# Patient Record
Sex: Male | Born: 1970
Health system: Southern US, Community
[De-identification: ages and names within clinical notes are randomized; demographics above are authoritative.]

## PROBLEM LIST (undated history)

## (undated) DIAGNOSIS — I2510 Atherosclerotic heart disease of native coronary artery without angina pectoris: Secondary | ICD-10-CM

## (undated) DIAGNOSIS — Z720 Tobacco use: Secondary | ICD-10-CM

## (undated) DIAGNOSIS — F419 Anxiety disorder, unspecified: Secondary | ICD-10-CM

## (undated) HISTORY — PX: CARDIAC CATHETERIZATION: SHX172

## (undated) HISTORY — DX: Tobacco use: Z72.0

## (undated) HISTORY — PX: HERNIA REPAIR: SHX51

## (undated) HISTORY — DX: Atherosclerotic heart disease of native coronary artery without angina pectoris: I25.10

---

## 1999-01-29 ENCOUNTER — Emergency Department (HOSPITAL_COMMUNITY): Admission: EM | Admit: 1999-01-29 | Discharge: 1999-01-29 | Payer: Self-pay | Admitting: Emergency Medicine

## 1999-01-31 ENCOUNTER — Emergency Department (HOSPITAL_COMMUNITY): Admission: EM | Admit: 1999-01-31 | Discharge: 1999-01-31 | Payer: Self-pay | Admitting: *Deleted

## 2000-04-19 ENCOUNTER — Emergency Department (HOSPITAL_COMMUNITY): Admission: EM | Admit: 2000-04-19 | Discharge: 2000-04-19 | Payer: Self-pay | Admitting: Emergency Medicine

## 2003-08-23 ENCOUNTER — Emergency Department (HOSPITAL_COMMUNITY): Admission: EM | Admit: 2003-08-23 | Discharge: 2003-08-23 | Payer: Self-pay | Admitting: Family Medicine

## 2003-09-24 ENCOUNTER — Emergency Department (HOSPITAL_COMMUNITY): Admission: AD | Admit: 2003-09-24 | Discharge: 2003-09-24 | Payer: Self-pay | Admitting: Family Medicine

## 2004-01-24 ENCOUNTER — Emergency Department (HOSPITAL_COMMUNITY): Admission: EM | Admit: 2004-01-24 | Discharge: 2004-01-24 | Payer: Self-pay | Admitting: Family Medicine

## 2004-09-16 ENCOUNTER — Emergency Department (HOSPITAL_COMMUNITY): Admission: EM | Admit: 2004-09-16 | Discharge: 2004-09-16 | Payer: Self-pay | Admitting: Family Medicine

## 2005-01-01 ENCOUNTER — Ambulatory Visit (HOSPITAL_BASED_OUTPATIENT_CLINIC_OR_DEPARTMENT_OTHER): Admission: RE | Admit: 2005-01-01 | Discharge: 2005-01-01 | Payer: Self-pay | Admitting: General Surgery

## 2005-01-01 ENCOUNTER — Encounter (INDEPENDENT_AMBULATORY_CARE_PROVIDER_SITE_OTHER): Payer: Self-pay | Admitting: Specialist

## 2005-01-01 ENCOUNTER — Ambulatory Visit (HOSPITAL_COMMUNITY): Admission: RE | Admit: 2005-01-01 | Discharge: 2005-01-01 | Payer: Self-pay | Admitting: General Surgery

## 2005-02-10 ENCOUNTER — Emergency Department (HOSPITAL_COMMUNITY): Admission: EM | Admit: 2005-02-10 | Discharge: 2005-02-10 | Payer: Self-pay | Admitting: Emergency Medicine

## 2008-02-02 ENCOUNTER — Emergency Department (HOSPITAL_COMMUNITY): Admission: EM | Admit: 2008-02-02 | Discharge: 2008-02-02 | Payer: Self-pay | Admitting: Family Medicine

## 2008-08-01 ENCOUNTER — Emergency Department (HOSPITAL_COMMUNITY): Admission: EM | Admit: 2008-08-01 | Discharge: 2008-08-01 | Payer: Self-pay | Admitting: Emergency Medicine

## 2009-07-27 ENCOUNTER — Emergency Department (HOSPITAL_COMMUNITY): Admission: EM | Admit: 2009-07-27 | Discharge: 2009-07-27 | Payer: Self-pay | Admitting: Emergency Medicine

## 2010-12-11 NOTE — Op Note (Signed)
NAMELEONA, Charles Montoya                  ACCOUNT NO.:  [id number]   MEDICAL RECORD NO.:  [id number]          PATIENT TYPE:  AMB   LOCATION:  DSC                          FACILITY:  [facility]   PHYSICIAN:  Gita Kudo, M.D. DATE OF BIRTH:  Sep 30, 1970   DATE OF PROCEDURE:  01/01/2005  DATE OF DISCHARGE:                                 OPERATIVE REPORT   OPERATIVE PROCEDURE:  Excision cystic lesion, right medial knee.   SURGEON:  Gita Kudo, M.D.   ANESTHESIA:  1% Xylocaine.   PREOPERATIVE DIAGNOSIS:  Cystic lesion, left knee medially.   POSTOPERATIVE DIAGNOSIS:  Cystic lesion, left knee medially.   CLINICAL SUMMARY:  A 40 year old male with an enlarging lesion on the medial  aspect of his left knee. He states this has been there for a few months. An  aspiration at an outpatient facility did not yield fluid. It enlarged and I  saw him in the office.  Since then he stated that it had drained.   OPERATIVE FINDING:  There was an inflammatory mass in the medial aspect of  the left knee,  consistent with an inflamed epidermoid cyst. It did not  extend beneath the deep fascia.   OPERATIVE PROCEDURE:  The patient was positioned, prepped and draped in the  standard fashion. Xylocaine 1% was infiltrated for good analgesia. An  elliptical incision made around the mass, which was then sharply dissected  from  the underlying soft tissue and removed. The wound was then infiltrated  with more Xylocaine, one vein was tied with 3-0 Vicryl, and then the wound  closed with interrupted widely placed 3-0 nylon mattress sutures. A sterile  absorbent compressive dressing was applied, and he went to the waiting room  to be discharged as an outpatient. He was prescribed Vicodin for pain and  given written instructions.       MRL/MEDQ  D:  01/01/2005  T:  01/01/2005  Job:  [id number]

## 2010-12-26 ENCOUNTER — Inpatient Hospital Stay (INDEPENDENT_AMBULATORY_CARE_PROVIDER_SITE_OTHER)
Admission: RE | Admit: 2010-12-26 | Discharge: 2010-12-26 | Disposition: A | Discharge status: Home / Self Care | Payer: Self-pay | Source: Ambulatory Visit | Attending: Family Medicine | Admitting: Family Medicine

## 2010-12-26 ENCOUNTER — Ambulatory Visit (INDEPENDENT_AMBULATORY_CARE_PROVIDER_SITE_OTHER): Payer: Self-pay

## 2010-12-26 DIAGNOSIS — R0609 Other forms of dyspnea: Secondary | ICD-10-CM

## 2010-12-26 DIAGNOSIS — R0989 Other specified symptoms and signs involving the circulatory and respiratory systems: Secondary | ICD-10-CM

## 2010-12-26 DIAGNOSIS — J40 Bronchitis, not specified as acute or chronic: Secondary | ICD-10-CM

## 2012-06-22 ENCOUNTER — Encounter (HOSPITAL_COMMUNITY): Payer: Self-pay | Admitting: Emergency Medicine

## 2012-06-22 ENCOUNTER — Emergency Department (HOSPITAL_COMMUNITY)
Admission: EM | Admit: 2012-06-22 | Discharge: 2012-06-22 | Disposition: A | Discharge status: Home / Self Care | Payer: Self-pay | Attending: Emergency Medicine | Admitting: Emergency Medicine

## 2012-06-22 DIAGNOSIS — J029 Acute pharyngitis, unspecified: Secondary | ICD-10-CM | POA: Insufficient documentation

## 2012-06-22 DIAGNOSIS — F17200 Nicotine dependence, unspecified, uncomplicated: Secondary | ICD-10-CM | POA: Insufficient documentation

## 2012-06-22 DIAGNOSIS — R599 Enlarged lymph nodes, unspecified: Secondary | ICD-10-CM | POA: Insufficient documentation

## 2012-06-22 DIAGNOSIS — Z79899 Other long term (current) drug therapy: Secondary | ICD-10-CM | POA: Insufficient documentation

## 2012-06-22 DIAGNOSIS — R1310 Dysphagia, unspecified: Secondary | ICD-10-CM | POA: Insufficient documentation

## 2012-06-22 MED ORDER — AZITHROMYCIN 250 MG PO TABS: ORAL_TABLET | ORAL | Status: DC

## 2012-06-22 NOTE — ED Provider Notes (Signed)
History     CSN: [id number]  Arrival date & time 06/22/12  4098   First MD Initiated Contact with Patient 06/22/12 0502      Chief Complaint  Patient presents with  . Sore Throat    (Consider location/radiation/quality/duration/timing/severity/associated sxs/prior treatment) HPI Comments: Patient presents with complaint of sore throat and cervical LDA X 2 days. He states that he felt that he had some trouble swallowing. Denies fever or chills. Denies cough or congestion.   The history is provided by the patient. No language interpreter was used.    History reviewed. No pertinent past medical history.  History reviewed. No pertinent past surgical history.  No family history on file.  History  Substance Use Topics  . Smoking status: Current Every Day Smoker  . Smokeless tobacco: Not on file  . Alcohol Use: No      Review of Systems  Constitutional: Negative for fever and chills.  HENT: Positive for sore throat and trouble swallowing. Negative for congestion.   Respiratory: Negative for cough.     Allergies  Review of patient's allergies indicates no known allergies.  Home Medications   Current Outpatient Rx  Name  Route  Sig  Dispense  Refill  . ALPRAZOLAM 0.5 MG PO TABS   Oral   Take 0.25 mg by mouth 3 (three) times daily as needed. Anxiety         . CLOMIPRAMINE HCL 25 MG PO CAPS   Oral   Take 25 mg by mouth at bedtime.         Marland Kitchen SACCHAROMYCES BOULARDII 250 MG PO CAPS   Oral   Take 250 mg by mouth daily.           BP 135/95  Pulse 74  Temp 97.6 F (36.4 C) (Oral)  Resp 18  Ht 5\' 10"  (1.778 m)  Wt 175 lb (79.379 kg)  BMI 25.11 kg/m2  SpO2 100%  Physical Exam  Nursing note and vitals reviewed. Constitutional: He appears well-developed and well-nourished.  HENT:  Head: Normocephalic and atraumatic.  Mouth/Throat: Oropharynx is clear and moist. No oropharyngeal exudate.       Oropharynx erythematous and injected.  Eyes: Conjunctivae  normal and EOM are normal. No scleral icterus.  Neck: Normal range of motion. Neck supple.       Superficial cervical adenopathy appreciated on the right.  Cardiovascular: Normal rate, regular rhythm and normal heart sounds.   Pulmonary/Chest: Effort normal and breath sounds normal.  Abdominal: Soft. There is no tenderness.  Lymphadenopathy:    He has cervical adenopathy.  Neurological: He is alert.  Skin: Skin is warm.    ED Course  Procedures (including critical care time)   Labs Reviewed  RAPID STREP SCREEN   Results for orders placed during the hospital encounter of 06/22/12  RAPID STREP SCREEN      Component Value Range   Streptococcus, Group A Screen (Direct) NEGATIVE  NEGATIVE    No results found.   1. Viral pharyngitis       MDM  Patient presented with symptoms of pharyngitis. Rapid strep negative. Discharged with return precautions. No red flags for peritonsillar abscess.         Pixie Casino, PA-C 06/22/12 [phone]

## 2012-06-22 NOTE — ED Notes (Signed)
Pt c/o pain and swelling to L neck, pt c/o pain with swallowing.

## 2012-06-23 NOTE — ED Provider Notes (Signed)
Medical screening examination/treatment/procedure(s) were performed by non-physician practitioner and as supervising physician I was immediately available for consultation/collaboration.  Matti Minney, MD 06/23/12 0009 

## 2012-12-19 ENCOUNTER — Ambulatory Visit: Payer: Self-pay | Admitting: Internal Medicine

## 2015-10-29 DIAGNOSIS — F4000 Agoraphobia, unspecified: Secondary | ICD-10-CM | POA: Diagnosis not present

## 2015-10-30 DIAGNOSIS — M9903 Segmental and somatic dysfunction of lumbar region: Secondary | ICD-10-CM | POA: Diagnosis not present

## 2015-10-30 DIAGNOSIS — M9906 Segmental and somatic dysfunction of lower extremity: Secondary | ICD-10-CM | POA: Diagnosis not present

## 2015-10-30 DIAGNOSIS — M791 Myalgia: Secondary | ICD-10-CM | POA: Diagnosis not present

## 2015-10-30 DIAGNOSIS — M9905 Segmental and somatic dysfunction of pelvic region: Secondary | ICD-10-CM | POA: Diagnosis not present

## 2015-11-04 DIAGNOSIS — F4000 Agoraphobia, unspecified: Secondary | ICD-10-CM | POA: Diagnosis not present

## 2015-11-13 DIAGNOSIS — F4000 Agoraphobia, unspecified: Secondary | ICD-10-CM | POA: Diagnosis not present

## 2015-11-24 DIAGNOSIS — F410 Panic disorder [episodic paroxysmal anxiety] without agoraphobia: Secondary | ICD-10-CM | POA: Diagnosis not present

## 2015-11-24 DIAGNOSIS — F4010 Social phobia, unspecified: Secondary | ICD-10-CM | POA: Diagnosis not present

## 2015-11-26 DIAGNOSIS — M9906 Segmental and somatic dysfunction of lower extremity: Secondary | ICD-10-CM | POA: Diagnosis not present

## 2015-11-26 DIAGNOSIS — M9905 Segmental and somatic dysfunction of pelvic region: Secondary | ICD-10-CM | POA: Diagnosis not present

## 2015-11-26 DIAGNOSIS — M9903 Segmental and somatic dysfunction of lumbar region: Secondary | ICD-10-CM | POA: Diagnosis not present

## 2015-11-26 DIAGNOSIS — M791 Myalgia: Secondary | ICD-10-CM | POA: Diagnosis not present

## 2015-11-28 DIAGNOSIS — F4000 Agoraphobia, unspecified: Secondary | ICD-10-CM | POA: Diagnosis not present

## 2015-12-03 DIAGNOSIS — M9906 Segmental and somatic dysfunction of lower extremity: Secondary | ICD-10-CM | POA: Diagnosis not present

## 2015-12-03 DIAGNOSIS — M9903 Segmental and somatic dysfunction of lumbar region: Secondary | ICD-10-CM | POA: Diagnosis not present

## 2015-12-03 DIAGNOSIS — M9905 Segmental and somatic dysfunction of pelvic region: Secondary | ICD-10-CM | POA: Diagnosis not present

## 2015-12-03 DIAGNOSIS — M791 Myalgia: Secondary | ICD-10-CM | POA: Diagnosis not present

## 2015-12-04 DIAGNOSIS — R0602 Shortness of breath: Secondary | ICD-10-CM | POA: Diagnosis not present

## 2015-12-04 DIAGNOSIS — J302 Other seasonal allergic rhinitis: Secondary | ICD-10-CM | POA: Diagnosis not present

## 2015-12-04 DIAGNOSIS — F4000 Agoraphobia, unspecified: Secondary | ICD-10-CM | POA: Diagnosis not present

## 2015-12-11 DIAGNOSIS — M9905 Segmental and somatic dysfunction of pelvic region: Secondary | ICD-10-CM | POA: Diagnosis not present

## 2015-12-11 DIAGNOSIS — M9903 Segmental and somatic dysfunction of lumbar region: Secondary | ICD-10-CM | POA: Diagnosis not present

## 2015-12-11 DIAGNOSIS — M9906 Segmental and somatic dysfunction of lower extremity: Secondary | ICD-10-CM | POA: Diagnosis not present

## 2015-12-11 DIAGNOSIS — M791 Myalgia: Secondary | ICD-10-CM | POA: Diagnosis not present

## 2015-12-16 DIAGNOSIS — M9905 Segmental and somatic dysfunction of pelvic region: Secondary | ICD-10-CM | POA: Diagnosis not present

## 2015-12-16 DIAGNOSIS — M9906 Segmental and somatic dysfunction of lower extremity: Secondary | ICD-10-CM | POA: Diagnosis not present

## 2015-12-16 DIAGNOSIS — M9903 Segmental and somatic dysfunction of lumbar region: Secondary | ICD-10-CM | POA: Diagnosis not present

## 2015-12-16 DIAGNOSIS — M791 Myalgia: Secondary | ICD-10-CM | POA: Diagnosis not present

## 2015-12-18 DIAGNOSIS — M9903 Segmental and somatic dysfunction of lumbar region: Secondary | ICD-10-CM | POA: Diagnosis not present

## 2015-12-18 DIAGNOSIS — M791 Myalgia: Secondary | ICD-10-CM | POA: Diagnosis not present

## 2015-12-18 DIAGNOSIS — M9906 Segmental and somatic dysfunction of lower extremity: Secondary | ICD-10-CM | POA: Diagnosis not present

## 2015-12-18 DIAGNOSIS — M9905 Segmental and somatic dysfunction of pelvic region: Secondary | ICD-10-CM | POA: Diagnosis not present

## 2016-01-01 DIAGNOSIS — F4000 Agoraphobia, unspecified: Secondary | ICD-10-CM | POA: Diagnosis not present

## 2016-02-02 DIAGNOSIS — M9901 Segmental and somatic dysfunction of cervical region: Secondary | ICD-10-CM | POA: Diagnosis not present

## 2016-02-02 DIAGNOSIS — M791 Myalgia: Secondary | ICD-10-CM | POA: Diagnosis not present

## 2016-02-02 DIAGNOSIS — G44209 Tension-type headache, unspecified, not intractable: Secondary | ICD-10-CM | POA: Diagnosis not present

## 2016-02-02 DIAGNOSIS — M9902 Segmental and somatic dysfunction of thoracic region: Secondary | ICD-10-CM | POA: Diagnosis not present

## 2016-02-03 DIAGNOSIS — F4000 Agoraphobia, unspecified: Secondary | ICD-10-CM | POA: Diagnosis not present

## 2016-02-17 DIAGNOSIS — F4000 Agoraphobia, unspecified: Secondary | ICD-10-CM | POA: Diagnosis not present

## 2016-03-10 DIAGNOSIS — F4000 Agoraphobia, unspecified: Secondary | ICD-10-CM | POA: Diagnosis not present

## 2016-03-23 DIAGNOSIS — F4000 Agoraphobia, unspecified: Secondary | ICD-10-CM | POA: Diagnosis not present

## 2016-04-07 DIAGNOSIS — F4000 Agoraphobia, unspecified: Secondary | ICD-10-CM | POA: Diagnosis not present

## 2016-04-21 DIAGNOSIS — F4000 Agoraphobia, unspecified: Secondary | ICD-10-CM | POA: Diagnosis not present

## 2016-05-05 DIAGNOSIS — F4000 Agoraphobia, unspecified: Secondary | ICD-10-CM | POA: Diagnosis not present

## 2016-05-17 DIAGNOSIS — F410 Panic disorder [episodic paroxysmal anxiety] without agoraphobia: Secondary | ICD-10-CM | POA: Diagnosis not present

## 2016-06-03 DIAGNOSIS — F4000 Agoraphobia, unspecified: Secondary | ICD-10-CM | POA: Diagnosis not present

## 2016-06-30 DIAGNOSIS — F4000 Agoraphobia, unspecified: Secondary | ICD-10-CM | POA: Diagnosis not present

## 2016-11-08 DIAGNOSIS — F4010 Social phobia, unspecified: Secondary | ICD-10-CM | POA: Diagnosis not present

## 2016-11-16 DIAGNOSIS — M9901 Segmental and somatic dysfunction of cervical region: Secondary | ICD-10-CM | POA: Diagnosis not present

## 2016-11-16 DIAGNOSIS — M545 Low back pain: Secondary | ICD-10-CM | POA: Diagnosis not present

## 2016-11-16 DIAGNOSIS — M542 Cervicalgia: Secondary | ICD-10-CM | POA: Diagnosis not present

## 2016-11-16 DIAGNOSIS — M9903 Segmental and somatic dysfunction of lumbar region: Secondary | ICD-10-CM | POA: Diagnosis not present

## 2016-11-23 DIAGNOSIS — M545 Low back pain: Secondary | ICD-10-CM | POA: Diagnosis not present

## 2016-11-23 DIAGNOSIS — M9903 Segmental and somatic dysfunction of lumbar region: Secondary | ICD-10-CM | POA: Diagnosis not present

## 2016-11-23 DIAGNOSIS — M9901 Segmental and somatic dysfunction of cervical region: Secondary | ICD-10-CM | POA: Diagnosis not present

## 2016-11-23 DIAGNOSIS — M542 Cervicalgia: Secondary | ICD-10-CM | POA: Diagnosis not present

## 2016-11-25 DIAGNOSIS — M542 Cervicalgia: Secondary | ICD-10-CM | POA: Diagnosis not present

## 2016-11-25 DIAGNOSIS — M9901 Segmental and somatic dysfunction of cervical region: Secondary | ICD-10-CM | POA: Diagnosis not present

## 2016-11-25 DIAGNOSIS — M545 Low back pain: Secondary | ICD-10-CM | POA: Diagnosis not present

## 2016-11-25 DIAGNOSIS — M9903 Segmental and somatic dysfunction of lumbar region: Secondary | ICD-10-CM | POA: Diagnosis not present

## 2016-11-29 DIAGNOSIS — S61219A Laceration without foreign body of unspecified finger without damage to nail, initial encounter: Secondary | ICD-10-CM | POA: Diagnosis not present

## 2016-12-13 DIAGNOSIS — M542 Cervicalgia: Secondary | ICD-10-CM | POA: Diagnosis not present

## 2016-12-13 DIAGNOSIS — M545 Low back pain: Secondary | ICD-10-CM | POA: Diagnosis not present

## 2016-12-13 DIAGNOSIS — M9901 Segmental and somatic dysfunction of cervical region: Secondary | ICD-10-CM | POA: Diagnosis not present

## 2016-12-13 DIAGNOSIS — M9903 Segmental and somatic dysfunction of lumbar region: Secondary | ICD-10-CM | POA: Diagnosis not present

## 2017-01-05 DIAGNOSIS — M9903 Segmental and somatic dysfunction of lumbar region: Secondary | ICD-10-CM | POA: Diagnosis not present

## 2017-01-05 DIAGNOSIS — M542 Cervicalgia: Secondary | ICD-10-CM | POA: Diagnosis not present

## 2017-01-05 DIAGNOSIS — M545 Low back pain: Secondary | ICD-10-CM | POA: Diagnosis not present

## 2017-01-05 DIAGNOSIS — M9901 Segmental and somatic dysfunction of cervical region: Secondary | ICD-10-CM | POA: Diagnosis not present

## 2017-01-10 DIAGNOSIS — M9903 Segmental and somatic dysfunction of lumbar region: Secondary | ICD-10-CM | POA: Diagnosis not present

## 2017-01-10 DIAGNOSIS — M542 Cervicalgia: Secondary | ICD-10-CM | POA: Diagnosis not present

## 2017-01-10 DIAGNOSIS — M545 Low back pain: Secondary | ICD-10-CM | POA: Diagnosis not present

## 2017-01-10 DIAGNOSIS — M9901 Segmental and somatic dysfunction of cervical region: Secondary | ICD-10-CM | POA: Diagnosis not present

## 2017-01-18 ENCOUNTER — Inpatient Hospital Stay (HOSPITAL_COMMUNITY)
Admission: EM | Admit: 2017-01-18 | Discharge: 2017-01-20 | DRG: 247 | Disposition: A | Discharge status: Home / Self Care | Payer: BLUE CROSS/BLUE SHIELD | Attending: Internal Medicine | Admitting: Internal Medicine

## 2017-01-18 ENCOUNTER — Encounter (HOSPITAL_COMMUNITY): Payer: Self-pay | Admitting: *Deleted

## 2017-01-18 ENCOUNTER — Emergency Department (HOSPITAL_COMMUNITY): Payer: BLUE CROSS/BLUE SHIELD

## 2017-01-18 DIAGNOSIS — R079 Chest pain, unspecified: Secondary | ICD-10-CM | POA: Diagnosis not present

## 2017-01-18 DIAGNOSIS — R748 Abnormal levels of other serum enzymes: Secondary | ICD-10-CM | POA: Diagnosis not present

## 2017-01-18 DIAGNOSIS — F419 Anxiety disorder, unspecified: Secondary | ICD-10-CM | POA: Diagnosis not present

## 2017-01-18 DIAGNOSIS — M542 Cervicalgia: Secondary | ICD-10-CM | POA: Diagnosis not present

## 2017-01-18 DIAGNOSIS — Z79899 Other long term (current) drug therapy: Secondary | ICD-10-CM

## 2017-01-18 DIAGNOSIS — K219 Gastro-esophageal reflux disease without esophagitis: Secondary | ICD-10-CM | POA: Diagnosis present

## 2017-01-18 DIAGNOSIS — M9901 Segmental and somatic dysfunction of cervical region: Secondary | ICD-10-CM | POA: Diagnosis not present

## 2017-01-18 DIAGNOSIS — Z955 Presence of coronary angioplasty implant and graft: Secondary | ICD-10-CM

## 2017-01-18 DIAGNOSIS — Z720 Tobacco use: Secondary | ICD-10-CM | POA: Diagnosis present

## 2017-01-18 DIAGNOSIS — I25110 Atherosclerotic heart disease of native coronary artery with unstable angina pectoris: Secondary | ICD-10-CM | POA: Diagnosis not present

## 2017-01-18 DIAGNOSIS — R072 Precordial pain: Secondary | ICD-10-CM

## 2017-01-18 DIAGNOSIS — F329 Major depressive disorder, single episode, unspecified: Secondary | ICD-10-CM | POA: Diagnosis not present

## 2017-01-18 DIAGNOSIS — I200 Unstable angina: Secondary | ICD-10-CM | POA: Diagnosis not present

## 2017-01-18 DIAGNOSIS — F17210 Nicotine dependence, cigarettes, uncomplicated: Secondary | ICD-10-CM | POA: Diagnosis present

## 2017-01-18 DIAGNOSIS — M9903 Segmental and somatic dysfunction of lumbar region: Secondary | ICD-10-CM | POA: Diagnosis not present

## 2017-01-18 DIAGNOSIS — I2510 Atherosclerotic heart disease of native coronary artery without angina pectoris: Secondary | ICD-10-CM

## 2017-01-18 DIAGNOSIS — M545 Low back pain: Secondary | ICD-10-CM | POA: Diagnosis not present

## 2017-01-18 HISTORY — DX: Anxiety disorder, unspecified: F41.9

## 2017-01-18 LAB — RAPID URINE DRUG SCREEN, HOSP PERFORMED
AMPHETAMINES: NOT DETECTED
BARBITURATES: NOT DETECTED
BENZODIAZEPINES: NOT DETECTED
Cocaine: NOT DETECTED
Opiates: NOT DETECTED
TETRAHYDROCANNABINOL: NOT DETECTED

## 2017-01-18 LAB — CBC
HCT: 40.3 % (ref 39.0–52.0)
HEMOGLOBIN: 14.7 g/dL (ref 13.0–17.0)
MCH: 31.7 pg (ref 26.0–34.0)
MCHC: 36.5 g/dL — ABNORMAL HIGH (ref 30.0–36.0)
MCV: 86.9 fL (ref 78.0–100.0)
Platelets: 240 10*3/uL (ref 150–400)
RBC: 4.64 MIL/uL (ref 4.22–5.81)
RDW: 12.6 % (ref 11.5–15.5)
WBC: 9.1 10*3/uL (ref 4.0–10.5)

## 2017-01-18 LAB — HEPATIC FUNCTION PANEL
ALT: 24 U/L (ref 17–63)
AST: 25 U/L (ref 15–41)
Albumin: 4.7 g/dL (ref 3.5–5.0)
Alkaline Phosphatase: 54 U/L (ref 38–126)
BILIRUBIN INDIRECT: 0.4 mg/dL (ref 0.3–0.9)
Bilirubin, Direct: 0.1 mg/dL (ref 0.1–0.5)
TOTAL PROTEIN: 7.5 g/dL (ref 6.5–8.1)
Total Bilirubin: 0.5 mg/dL (ref 0.3–1.2)

## 2017-01-18 LAB — BASIC METABOLIC PANEL
ANION GAP: 7 (ref 5–15)
BUN: 14 mg/dL (ref 6–20)
CALCIUM: 10 mg/dL (ref 8.9–10.3)
CO2: 29 mmol/L (ref 22–32)
Chloride: 103 mmol/L (ref 101–111)
Creatinine, Ser: 1.15 mg/dL (ref 0.61–1.24)
GFR calc non Af Amer: >60 mL/min (ref >60)
Glucose, Bld: 88 mg/dL (ref 65–99)
POTASSIUM: 4 mmol/L (ref 3.5–5.1)
Sodium: 139 mmol/L (ref 135–145)

## 2017-01-18 LAB — POCT I-STAT TROPONIN I: TROPONIN I, POC: 0.11 ng/mL — AB (ref 0.00–0.08)

## 2017-01-18 LAB — LIPASE, BLOOD: LIPASE: 49 U/L (ref 11–51)

## 2017-01-18 LAB — TROPONIN I: Troponin I: 0.11 ng/mL (ref <0.03)

## 2017-01-18 MED ORDER — PANTOPRAZOLE SODIUM 40 MG PO TBEC
40.0000 mg | DELAYED_RELEASE_TABLET | Freq: Every day | ORAL | Status: DC
  Administered 2017-01-19: 40 mg via ORAL
  Filled 2017-01-18 (×3): qty 1

## 2017-01-18 MED ORDER — CLOMIPRAMINE HCL 25 MG PO CAPS
25.0000 mg | ORAL_CAPSULE | Freq: Every day | ORAL | Status: DC
  Administered 2017-01-20: 25 mg via ORAL
  Filled 2017-01-18 (×2): qty 1

## 2017-01-18 MED ORDER — ASPIRIN 81 MG PO CHEW
324.0000 mg | CHEWABLE_TABLET | Freq: Once | ORAL | Status: AC
  Administered 2017-01-18: 324 mg via ORAL
  Filled 2017-01-18: qty 4

## 2017-01-18 MED ORDER — HYDRALAZINE HCL 20 MG/ML IJ SOLN: 10.0000 mg | INTRAMUSCULAR | Status: DC | PRN

## 2017-01-18 MED ORDER — ENOXAPARIN SODIUM 40 MG/0.4ML ~~LOC~~ SOLN
40.0000 mg | SUBCUTANEOUS | Status: DC
  Administered 2017-01-19: 40 mg via SUBCUTANEOUS
  Filled 2017-01-18: qty 0.4

## 2017-01-18 MED ORDER — NICOTINE 21 MG/24HR TD PT24: 21.0000 mg | MEDICATED_PATCH | Freq: Every day | TRANSDERMAL | Status: DC

## 2017-01-18 MED ORDER — ONDANSETRON HCL 4 MG/2ML IJ SOLN: 4.0000 mg | Freq: Four times a day (QID) | INTRAMUSCULAR | Status: DC | PRN

## 2017-01-18 MED ORDER — ACETAMINOPHEN 325 MG PO TABS: 650.0000 mg | ORAL_TABLET | ORAL | Status: DC | PRN

## 2017-01-18 MED ORDER — ALPRAZOLAM 0.5 MG PO TABS
0.5000 mg | ORAL_TABLET | Freq: Three times a day (TID) | ORAL | Status: DC | PRN
  Administered 2017-01-19 (×2): 0.5 mg via ORAL
  Administered 2017-01-19: 0.25 mg via ORAL
  Filled 2017-01-18 (×3): qty 1

## 2017-01-18 MED ORDER — ASPIRIN EC 325 MG PO TBEC
325.0000 mg | DELAYED_RELEASE_TABLET | Freq: Every day | ORAL | Status: DC
  Administered 2017-01-19: 325 mg via ORAL
  Filled 2017-01-18: qty 1

## 2017-01-18 NOTE — ED Notes (Signed)
Md at bedside

## 2017-01-18 NOTE — H&P (Signed)
History and Physical    Charles Montoya QBH:[id number] DOB: 1971-04-28 DOA: 01/18/2017  PCP: [facility]  Patient coming from: Home.  Chief Complaint: Chest pain.  HPI: Charles Montoya is a 46 y.o. male with history of tobacco abuse, anxiety and depression presents with complaints of chest pain. Patient states over the last 3-4 days patient has been having retrosternal burning sensation lasting for 5 minutes present even at rest and gets relief without any intervention. Patient has been taking PPI despite which patient gets these episodes. Denies any nausea vomiting diarrhea. Denies any shortness of breath fever or chills has been having some cough.   ED Course: In the ER EKG was showing normal sinus rhythm with chest x-ray unremarkable. Troponin was mildly positive. Cardiologist on-call was consulted. Cardiologist requested transfer to [facility]. Patient had my exam was chest pain-free.  Review of Systems: As per HPI, rest all negative.   Past Medical History:  Diagnosis Date  . Anxiety     Past Surgical History:  Procedure Laterality Date  . HERNIA REPAIR       reports that he has been smoking Cigarettes.  He has been smoking about 1.50 packs per day. He has never used smokeless tobacco. He reports that he does not drink alcohol or use drugs.  Allergies  Allergen Reactions  . Codeine Itching    Family History  Problem Relation Age of Onset  . CAD Father     Prior to Admission medications   Medication Sig Start Date End Date Taking? Authorizing Provider  ALPRAZolam Duanne Moron) 0.5 MG tablet Take 0.5 mg by mouth 3 (three) times daily as needed for anxiety. Anxiety    Yes [provider]  clomiPRAMINE (ANAFRANIL) 25 MG capsule Take 25 mg by mouth at bedtime.   Yes [provider]  omeprazole (PRILOSEC) 40 MG capsule Take 40 mg by mouth daily.   Yes [provider]  azithromycin (ZITHROMAX Z-PAK) 250 MG tablet 2 po day  one, then 1 daily x 4 days Patient not taking: Reported on 01/18/2017 06/22/12   Kathyrn Lass, PA-C    Physical Exam: Vitals:   01/18/17 2130 01/18/17 2130 01/18/17 2139 01/18/17 2200  BP: (!) 159/102 (!) 172/111 (!) 174/96 (!) 151/104  Pulse: 66 64  70  Resp: 18 14 12 17   Temp: 98.4 F (36.9 C)     TempSrc: Oral     SpO2: 100% 100%  100%  Weight:      Height:          Constitutional: Moderately built and nourished. Vitals:   01/18/17 2130 01/18/17 2130 01/18/17 2139 01/18/17 2200  BP: (!) 159/102 (!) 172/111 (!) 174/96 (!) 151/104  Pulse: 66 64  70  Resp: 18 14 12 17   Temp: 98.4 F (36.9 C)     TempSrc: Oral     SpO2: 100% 100%  100%  Weight:      Height:       Eyes: Anicteric no pallor. ENMT: No discharge from the ears eyes nose and mouth. Neck: No mass felt. No JVD appreciated. Respiratory: No rhonchi or crepitations. Cardiovascular: S1-S2 no murmurs appreciated. Abdomen: Soft nontender bowel sounds present. Musculoskeletal: No edema. No joint effusion. Skin: No rash. Skin appears warm. Neurologic: Alert awake oriented to time place and person. Moves all extremities. Psychiatric: Appears normal. Normal affect.   Labs on Admission: I have personally reviewed following labs and imaging studies  CBC:  Recent Labs  Lab 01/18/17 2045  WBC 9.1  HGB 14.7  HCT 40.3  MCV 86.9  PLT 678   Basic Metabolic Panel:  Recent Labs Lab 01/18/17 2045  NA 139  K 4.0  CL 103  CO2 29  GLUCOSE 88  BUN 14  CREATININE 1.15  CALCIUM 10.0   GFR: Estimated Creatinine Clearance: 87.3 mL/min (by C-G formula based on SCr of 1.15 mg/dL). Liver Function Tests:  Recent Labs Lab 01/18/17 2045  AST 25  ALT 24  ALKPHOS 54  BILITOT 0.5  PROT 7.5  ALBUMIN 4.7    Recent Labs Lab 01/18/17 2045  LIPASE 49   No results for input(s): AMMONIA in the last 168 hours. Coagulation Profile: No results for input(s): INR, PROTIME in the last 168 hours. Cardiac  Enzymes: No results for input(s): CKTOTAL, CKMB, CKMBINDEX, TROPONINI in the last 168 hours. BNP (last 3 results) No results for input(s): PROBNP in the last 8760 hours. HbA1C: No results for input(s): HGBA1C in the last 72 hours. CBG: No results for input(s): GLUCAP in the last 168 hours. Lipid Profile: No results for input(s): CHOL, HDL, LDLCALC, TRIG, CHOLHDL, LDLDIRECT in the last 72 hours. Thyroid Function Tests: No results for input(s): TSH, T4TOTAL, FREET4, T3FREE, THYROIDAB in the last 72 hours. Anemia Panel: No results for input(s): VITAMINB12, FOLATE, FERRITIN, TIBC, IRON, RETICCTPCT in the last 72 hours. Urine analysis: No results found for: COLORURINE, APPEARANCEUR, LABSPEC, PHURINE, GLUCOSEU, HGBUR, BILIRUBINUR, KETONESUR, PROTEINUR, UROBILINOGEN, NITRITE, LEUKOCYTESUR Sepsis Labs: @LABRCNTIP (procalcitonin:4,lacticidven:4) )No results found for this or any previous visit (from the past 240 hour(s)).   Radiological Exams on Admission: Dg Chest 2 View  Result Date: 01/18/2017 CLINICAL DATA:  Four-day history of intermittent chest pain. Current smoker. EXAM: CHEST  2 VIEW COMPARISON:  12/26/2010. FINDINGS: Cardiomediastinal silhouette unremarkable, unchanged. Lungs clear. Bronchovascular markings normal. Pulmonary vascularity normal. No visible pleural effusions. No pneumothorax. Degenerative changes involving the thoracic and upper lumbar spine. IMPRESSION: No acute cardiopulmonary disease. Electronically Signed   By: Evangeline Dakin M.D.   On: 01/18/2017 18:54    EKG: Independently reviewed. Normal sinus rhythm.  Assessment/Plan Principal Problem:   Chest pain Active Problems:   Tobacco abuse    1. Chest pain - given the risk factors including tobacco abuse and family history patient also has positive troponin will be admitted for further chest pain workup. Cardiology was notified and has requested transfer to [facility]. Dr. Alcario Drought will be the accepting  physician. Cycle cardiac markers check 2-D echo. Aspirin. Presently chest pain-free. Urine drug screen is pending. 2. Elevated blood pressure - closely follow blood pressure trends. I have placed patient on when necessary IV hydralazine. 3. History of depression and anxiety. Continue home medications.   DVT prophylaxis: Lovenox. Code Status: Full code.  Family Communication: Patient's mother.  Disposition Plan: Home.  Consults called: Cardiology.  Admission status: Observation.    Rise Patience MD [facility] Pager [phone].  If 7PM-7AM, please contact night-coverage [email or web address] Password [email or web address]  01/18/2017, 10:39 PM

## 2017-01-18 NOTE — ED Provider Notes (Signed)
[place] DEPT Provider Note   CSN: [id number] Arrival date & time: 01/18/17  8938     History   Chief Complaint Chief Complaint  Patient presents with  . Chest Pain    HPI Charles Montoya is a 46 y.o. male.  HPI Patients with 5 days of episodic central chest pain which she describes as burning and tingling in nature. Radiates to both arms. Last 5-10 minutes and then resolves spontaneously. Does not appear to be exertion related. One episode was after eating. Patient had episode of chest pain that started today around 5 PM. The pain is since resolved. Denies any new lower extremity swelling or pain. No extended travel or immobilization. Patient smokes 1.5 packs of cigarettes daily. Father had CABG in his 16s. Past Medical History:  Diagnosis Date  . Anxiety     Patient Active Problem List   Diagnosis Date Noted  . Chest pain 01/18/2017  . Tobacco abuse 01/18/2017    Past Surgical History:  Procedure Laterality Date  . HERNIA REPAIR         Home Medications    Prior to Admission medications   Medication Sig Start Date End Date Taking? Authorizing Provider  ALPRAZolam Duanne Moron) 0.5 MG tablet Take 0.5 mg by mouth 3 (three) times daily as needed for anxiety. Anxiety    Yes [provider]  clomiPRAMINE (ANAFRANIL) 25 MG capsule Take 25 mg by mouth at bedtime.   Yes [provider]  omeprazole (PRILOSEC) 40 MG capsule Take 40 mg by mouth daily.   Yes [provider]  azithromycin (ZITHROMAX Z-PAK) 250 MG tablet 2 po day one, then 1 daily x 4 days Patient not taking: Reported on 01/18/2017 06/22/12   Kathyrn Lass, PA-C    Family History Family History  Problem Relation Age of Onset  . CAD Father     Social History Social History  Substance Use Topics  . Smoking status: Current Every Day Smoker    Packs/day: 1.50    Types: Cigarettes  . Smokeless tobacco: Never Used  . Alcohol use No     Allergies   Codeine   Review of  Systems Review of Systems  Constitutional: Negative for chills and fever.  HENT: Negative for congestion.   Eyes: Negative for photophobia and visual disturbance.  Respiratory: Negative for cough, shortness of breath and wheezing.   Cardiovascular: Positive for chest pain. Negative for palpitations and leg swelling.  Gastrointestinal: Negative for abdominal pain, constipation, diarrhea, nausea and vomiting.  Genitourinary: Negative for dysuria, flank pain, frequency and hematuria.  Musculoskeletal: Negative for back pain, joint swelling, myalgias, neck pain and neck stiffness.  Skin: Negative for rash and wound.  Neurological: Negative for dizziness, weakness, light-headedness, numbness and headaches.  All other systems reviewed and are negative.    Physical Exam Updated Vital Signs BP (!) 169/113   Pulse 65   Temp 98.4 F (36.9 C) (Oral)   Resp 11   Ht 5\' 11"  (1.803 m)   Wt 84.8 kg (187 lb)   SpO2 99%   BMI 26.08 kg/m   Physical Exam   ED Treatments / Results  Labs (all labs ordered are listed, but only abnormal results are displayed) Labs Reviewed  CBC - Abnormal; Notable for the following:       Result Value   MCHC 36.5 (*)    All other components within normal limits  POCT I-STAT TROPONIN I - Abnormal; Notable for the following:    Troponin i,  poc 0.11 (*)    All other components within normal limits  BASIC METABOLIC PANEL  LIPASE, BLOOD  HEPATIC FUNCTION PANEL  TROPONIN I  TROPONIN I  TROPONIN I  HIV ANTIBODY (ROUTINE TESTING)  RAPID URINE DRUG SCREEN, HOSP PERFORMED  I-STAT TROPOININ, ED    EKG  EKG Interpretation  Date/Time:  Tuesday January 18 2017 18:10:25 EDT Ventricular Rate:  71 PR Interval:  126 QRS Duration: 104 QT Interval:  382 QTC Calculation: 415 R Axis:   46 Text Interpretation:  Normal sinus rhythm Incomplete right bundle branch block Cannot rule out Inferior infarct , age undetermined Abnormal ECG Confirmed by Lita Mains  MD, Shonte Soderlund  ([id number]) on 01/18/2017 10:29:18 PM       Radiology Dg Chest 2 View  Result Date: 01/18/2017 CLINICAL DATA:  Four-day history of intermittent chest pain. Current smoker. EXAM: CHEST  2 VIEW COMPARISON:  12/26/2010. FINDINGS: Cardiomediastinal silhouette unremarkable, unchanged. Lungs clear. Bronchovascular markings normal. Pulmonary vascularity normal. No visible pleural effusions. No pneumothorax. Degenerative changes involving the thoracic and upper lumbar spine. IMPRESSION: No acute cardiopulmonary disease. Electronically Signed   By: Evangeline Dakin M.D.   On: 01/18/2017 18:54    Procedures Procedures (including critical care time)  Medications Ordered in ED Medications  pantoprazole (PROTONIX) EC tablet 40 mg (not administered)  ALPRAZolam (XANAX) tablet 0.5 mg (not administered)  clomiPRAMINE (ANAFRANIL) capsule 25 mg (not administered)  acetaminophen (TYLENOL) tablet 650 mg (not administered)  ondansetron (ZOFRAN) injection 4 mg (not administered)  enoxaparin (LOVENOX) injection 40 mg (not administered)  aspirin EC tablet 325 mg (not administered)  hydrALAZINE (APRESOLINE) injection 10 mg (not administered)  aspirin chewable tablet 324 mg (324 mg Oral Given 01/18/17 2201)     Initial Impression / Assessment and Plan / ED Course  I have reviewed the triage vital signs and the nursing notes.  Pertinent labs & imaging results that were available during my care of the patient were reviewed by me and considered in my medical decision making (see chart for details).     Discussed with cardiology and recommended admitting to internal medicine and likely stress testing tomorrow. Patient given aspirin in the emergency department. Continues to be symptom free. Cardiology recommends trending troponins and have a try to admit and transfer to [facility]. [facility] will see in the emergency department. Dr. Alcario Drought to accept. Final Clinical Impressions(s) / ED Diagnoses   Final  diagnoses:  Precordial pain    New Prescriptions New Prescriptions   No medications on file     Julianne Rice, MD 01/18/17 2258

## 2017-01-18 NOTE — ED Triage Notes (Signed)
Pt reports upper chest pain that radiates to both his arms which lasts about 5 minutes each time.  Pt reports having this pain several times a day since Friday.  States onset could be anytime, nothing relieves it, or makes it worse.  Pt is A&Ox 4.  Pt reports his father had a CABG mid-60s.  Pt describes cp as burning, tingling, and sharp.  Denies any SOB,. Dizziness or nausea with it, only during the first bout of cp, but he states that it could be from his anxiety which he's been managing well.  Pt is also concerned that if this is not his heart, it might be PE d/t previous cold sxs for 2 weeks.  Pt is a smoker.

## 2017-01-19 ENCOUNTER — Other Ambulatory Visit: Payer: Self-pay

## 2017-01-19 ENCOUNTER — Encounter (HOSPITAL_COMMUNITY)
Admission: EM | Disposition: A | Discharge status: Home / Self Care | Payer: Self-pay | Source: Home / Self Care | Attending: Internal Medicine

## 2017-01-19 DIAGNOSIS — Z720 Tobacco use: Secondary | ICD-10-CM

## 2017-01-19 DIAGNOSIS — R748 Abnormal levels of other serum enzymes: Secondary | ICD-10-CM | POA: Diagnosis not present

## 2017-01-19 DIAGNOSIS — I200 Unstable angina: Secondary | ICD-10-CM | POA: Diagnosis not present

## 2017-01-19 DIAGNOSIS — F419 Anxiety disorder, unspecified: Secondary | ICD-10-CM | POA: Diagnosis not present

## 2017-01-19 DIAGNOSIS — I25110 Atherosclerotic heart disease of native coronary artery with unstable angina pectoris: Secondary | ICD-10-CM | POA: Diagnosis not present

## 2017-01-19 DIAGNOSIS — F329 Major depressive disorder, single episode, unspecified: Secondary | ICD-10-CM | POA: Diagnosis present

## 2017-01-19 DIAGNOSIS — Z79899 Other long term (current) drug therapy: Secondary | ICD-10-CM | POA: Diagnosis not present

## 2017-01-19 DIAGNOSIS — R072 Precordial pain: Secondary | ICD-10-CM | POA: Diagnosis not present

## 2017-01-19 DIAGNOSIS — I249 Acute ischemic heart disease, unspecified: Secondary | ICD-10-CM

## 2017-01-19 DIAGNOSIS — K219 Gastro-esophageal reflux disease without esophagitis: Secondary | ICD-10-CM | POA: Diagnosis not present

## 2017-01-19 DIAGNOSIS — I2510 Atherosclerotic heart disease of native coronary artery without angina pectoris: Secondary | ICD-10-CM

## 2017-01-19 DIAGNOSIS — R079 Chest pain, unspecified: Secondary | ICD-10-CM | POA: Diagnosis not present

## 2017-01-19 DIAGNOSIS — F17210 Nicotine dependence, cigarettes, uncomplicated: Secondary | ICD-10-CM | POA: Diagnosis present

## 2017-01-19 HISTORY — PX: LEFT HEART CATH AND CORONARY ANGIOGRAPHY: CATH118249

## 2017-01-19 HISTORY — PX: CORONARY STENT INTERVENTION: CATH118234

## 2017-01-19 LAB — PROTIME-INR
INR: 0.99
Prothrombin Time: 13.1 seconds (ref 11.4–15.2)

## 2017-01-19 LAB — POCT ACTIVATED CLOTTING TIME: Activated Clotting Time: 263 seconds

## 2017-01-19 LAB — LIPID PANEL
CHOL/HDL RATIO: 6.6 ratio
CHOLESTEROL: 219 mg/dL — AB (ref 0–200)
HDL: 33 mg/dL — AB (ref >40)
LDL Cholesterol: 138 mg/dL — ABNORMAL HIGH (ref 0–99)
TRIGLYCERIDES: 241 mg/dL — AB (ref <150)
VLDL: 48 mg/dL — AB (ref 0–40)

## 2017-01-19 LAB — TROPONIN I
TROPONIN I: 0.15 ng/mL — AB (ref <0.03)
Troponin I: 0.14 ng/mL (ref <0.03)
Troponin I: 0.12 ng/mL (ref <0.03)

## 2017-01-19 LAB — HIV ANTIBODY (ROUTINE TESTING W REFLEX): HIV SCREEN 4TH GENERATION: NONREACTIVE

## 2017-01-19 SURGERY — LEFT HEART CATH AND CORONARY ANGIOGRAPHY
Anesthesia: LOCAL

## 2017-01-19 MED ORDER — FENTANYL CITRATE (PF) 100 MCG/2ML IJ SOLN
INTRAMUSCULAR | Status: AC
  Filled 2017-01-19: qty 2

## 2017-01-19 MED ORDER — TICAGRELOR 90 MG PO TABS
90.0000 mg | ORAL_TABLET | Freq: Two times a day (BID) | ORAL | Status: DC
  Administered 2017-01-20: 06:00:00 90 mg via ORAL
  Filled 2017-01-19: qty 1

## 2017-01-19 MED ORDER — IOPAMIDOL (ISOVUE-370) INJECTION 76%
INTRAVENOUS | Status: AC
  Filled 2017-01-19: qty 50

## 2017-01-19 MED ORDER — SODIUM CHLORIDE 0.9 % IV SOLN: 250.0000 mL | INTRAVENOUS | Status: DC | PRN

## 2017-01-19 MED ORDER — HEPARIN SODIUM (PORCINE) 1000 UNIT/ML IJ SOLN
INTRAMUSCULAR | Status: DC | PRN
  Administered 2017-01-19: 2000 [IU] via INTRAVENOUS
  Administered 2017-01-19: 7000 [IU] via INTRAVENOUS

## 2017-01-19 MED ORDER — TIROFIBAN HCL IN NACL 5-0.9 MG/100ML-% IV SOLN
INTRAVENOUS | Status: AC
  Filled 2017-01-19: qty 100

## 2017-01-19 MED ORDER — HEPARIN (PORCINE) IN NACL 2-0.9 UNIT/ML-% IJ SOLN
INTRAMUSCULAR | Status: AC
  Filled 2017-01-19: qty 1000

## 2017-01-19 MED ORDER — VERAPAMIL HCL 2.5 MG/ML IV SOLN
INTRAVENOUS | Status: AC
  Filled 2017-01-19: qty 2

## 2017-01-19 MED ORDER — MIDAZOLAM HCL 2 MG/2ML IJ SOLN
INTRAMUSCULAR | Status: AC
  Filled 2017-01-19: qty 2

## 2017-01-19 MED ORDER — TIROFIBAN (AGGRASTAT) BOLUS VIA INFUSION
INTRAVENOUS | Status: DC | PRN
  Administered 2017-01-19: 2017.5 ug via INTRAVENOUS

## 2017-01-19 MED ORDER — ASPIRIN 81 MG PO CHEW
81.0000 mg | CHEWABLE_TABLET | Freq: Every day | ORAL | Status: DC
  Administered 2017-01-20: 81 mg via ORAL
  Filled 2017-01-19: qty 1

## 2017-01-19 MED ORDER — NITROGLYCERIN 1 MG/10 ML FOR IR/CATH LAB
INTRA_ARTERIAL | Status: AC
Start: 2017-01-19 — End: 2017-01-19
  Filled 2017-01-19: qty 10

## 2017-01-19 MED ORDER — MIDAZOLAM HCL 2 MG/2ML IJ SOLN
INTRAMUSCULAR | Status: DC | PRN
  Administered 2017-01-19: 1 mg via INTRAVENOUS
  Administered 2017-01-19: 2 mg via INTRAVENOUS

## 2017-01-19 MED ORDER — NITROGLYCERIN 1 MG/10 ML FOR IR/CATH LAB
INTRA_ARTERIAL | Status: DC | PRN
  Administered 2017-01-19: 200 ug via INTRACORONARY

## 2017-01-19 MED ORDER — SODIUM CHLORIDE 0.9 % WEIGHT BASED INFUSION
1.0000 mL/kg/h | INTRAVENOUS | Status: DC
  Administered 2017-01-19: 1 mL/kg/h via INTRAVENOUS

## 2017-01-19 MED ORDER — LIDOCAINE HCL (PF) 1 % IJ SOLN
INTRAMUSCULAR | Status: DC | PRN
  Administered 2017-01-19: 2 mL

## 2017-01-19 MED ORDER — SODIUM CHLORIDE 0.9 % IV SOLN
INTRAVENOUS | Status: AC
  Administered 2017-01-19: 20:00:00 via INTRAVENOUS

## 2017-01-19 MED ORDER — TICAGRELOR 90 MG PO TABS
ORAL_TABLET | ORAL | Status: DC | PRN
  Administered 2017-01-19: 180 mg via ORAL

## 2017-01-19 MED ORDER — SODIUM CHLORIDE 0.9% FLUSH: 3.0000 mL | Freq: Two times a day (BID) | INTRAVENOUS | Status: DC

## 2017-01-19 MED ORDER — HEPARIN SODIUM (PORCINE) 1000 UNIT/ML IJ SOLN
INTRAMUSCULAR | Status: AC
  Filled 2017-01-19: qty 1

## 2017-01-19 MED ORDER — IOPAMIDOL (ISOVUE-370) INJECTION 76%
INTRAVENOUS | Status: DC | PRN
Start: 2017-01-19 — End: 2017-01-19
  Administered 2017-01-19: 95 mL via INTRAVENOUS

## 2017-01-19 MED ORDER — FENTANYL CITRATE (PF) 100 MCG/2ML IJ SOLN
INTRAMUSCULAR | Status: DC | PRN
  Administered 2017-01-19 (×2): 25 ug via INTRAVENOUS

## 2017-01-19 MED ORDER — SODIUM CHLORIDE 0.9% FLUSH: 3.0000 mL | INTRAVENOUS | Status: DC | PRN

## 2017-01-19 MED ORDER — ATORVASTATIN CALCIUM 20 MG PO TABS
20.0000 mg | ORAL_TABLET | Freq: Every day | ORAL | Status: DC
  Administered 2017-01-19: 20 mg via ORAL
  Filled 2017-01-19: qty 1

## 2017-01-19 MED ORDER — IOPAMIDOL (ISOVUE-370) INJECTION 76%
INTRAVENOUS | Status: AC
  Filled 2017-01-19: qty 100

## 2017-01-19 MED ORDER — LABETALOL HCL 5 MG/ML IV SOLN: 10.0000 mg | INTRAVENOUS | Status: AC | PRN

## 2017-01-19 MED ORDER — LIDOCAINE HCL (PF) 1 % IJ SOLN
INTRAMUSCULAR | Status: AC
  Filled 2017-01-19: qty 30

## 2017-01-19 MED ORDER — ONDANSETRON HCL 4 MG/2ML IJ SOLN: 4.0000 mg | Freq: Four times a day (QID) | INTRAMUSCULAR | Status: DC | PRN

## 2017-01-19 MED ORDER — SODIUM CHLORIDE 0.9 % WEIGHT BASED INFUSION
3.0000 mL/kg/h | INTRAVENOUS | Status: DC
  Administered 2017-01-19: 3 mL/kg/h via INTRAVENOUS

## 2017-01-19 MED ORDER — TICAGRELOR 90 MG PO TABS
ORAL_TABLET | ORAL | Status: AC
  Filled 2017-01-19: qty 1

## 2017-01-19 MED ORDER — HYDRALAZINE HCL 20 MG/ML IJ SOLN: 5.0000 mg | INTRAMUSCULAR | Status: AC | PRN

## 2017-01-19 MED ORDER — ACETAMINOPHEN 325 MG PO TABS: 650.0000 mg | ORAL_TABLET | ORAL | Status: DC | PRN

## 2017-01-19 MED ORDER — VERAPAMIL HCL 2.5 MG/ML IV SOLN
INTRAVENOUS | Status: DC | PRN
  Administered 2017-01-19: 10 mL via INTRA_ARTERIAL

## 2017-01-19 MED ORDER — TIROFIBAN HCL IN NACL 5-0.9 MG/100ML-% IV SOLN
INTRAVENOUS | Status: AC | PRN
  Administered 2017-01-19: 0.15 ug/kg/min via INTRAVENOUS

## 2017-01-19 SURGICAL SUPPLY — 16 items
BALLN SAPPHIRE 2.5X15 (BALLOONS) ×2
BALLOON SAPPHIRE 2.5X15 (BALLOONS) IMPLANT
CATH INFINITI 5 FR JL3.5 (CATHETERS) ×1 IMPLANT
CATH INFINITI JR4 5F (CATHETERS) ×1 IMPLANT
CATH LAUNCHER 6FR JR4 (CATHETERS) ×1 IMPLANT
DEVICE RAD TR BAND REGULAR (VASCULAR PRODUCTS) ×1 IMPLANT
GLIDESHEATH SLEND SS 6F .021 (SHEATH) ×1 IMPLANT
GUIDEWIRE INQWIRE 1.5J.035X260 (WIRE) IMPLANT
INQWIRE 1.5J .035X260CM (WIRE) ×2
KIT HEART LEFT (KITS) ×2 IMPLANT
PACK CARDIAC CATHETERIZATION (CUSTOM PROCEDURE TRAY) ×2 IMPLANT
STENT PROMUS PREM MR 2.5X16 (Permanent Stent) ×1 IMPLANT
TRANSDUCER W/STOPCOCK (MISCELLANEOUS) ×2 IMPLANT
TUBING CIL FLEX 10 FLL-RA (TUBING) ×2 IMPLANT
VALVE GUARDIAN II ~~LOC~~ HEMO (MISCELLANEOUS) ×1 IMPLANT
WIRE ASAHI PROWATER 180CM (WIRE) ×1 IMPLANT

## 2017-01-19 NOTE — Progress Notes (Signed)
[facility] PROGRESS NOTE  Charles Montoya QQI:[id number] DOB: March 31, 1971 DOA: 01/18/2017   PCP: [facility]     Assessment/Plan: Principal Problem:   Chest pain Active Problems:   Tobacco abuse   Cardiac enzymes elevated    46 y.o. male with history of tobacco abuse, anxiety and depression presents with complaints of chest pain. Patient states over the last 3-4 days patient has been having retrosternal burning sensation lasting for 5 minutes present even at rest and gets relief without any intervention. Patient has been taking PPI despite which patient gets these episodes.  chest x-ray unremarkable. Troponin was mildly positive. Cardiologist consulted.    Assessment/Plan 1. Unstable angina - given the risk factors including tobacco abuse and family history patient also has positive troponin  Cardiology  Recommends cardiac cath. Pending  2-D echo. Aspirin. Presently chest pain-free. Urine drug screen negative.LDL 138, triglycerides 241,started on statin 2. Elevated blood pressure -continue prn  IV hydralazine. 3. History of depression and anxiety. Continue home medications.   DVT prophylaxsis lovenox  Code Status:  full   Family Communication: Discussed in detail with the patient, all imaging results, lab results explained to the patient   Disposition Plan:  Cardiac cath      Consultants:  cardiology  Procedures:  None   Antibiotics: Anti-infectives    None         HPI/Subjective: Anxious about cath , denies Chest pain,denies sob   Objective: Vitals:   01/19/17 0200 01/19/17 0230 01/19/17 0300 01/19/17 0339  BP: (!) 143/83 127/84 127/83 138/75  Pulse: 73 71 64 66  Resp:      Temp:    98 F (36.7 C)  TempSrc:      SpO2: 97% 98% 98% 98%  Weight:    80.7 kg (177 lb 14.4 oz)  Height:    5\' 11"  (1.803 m)    Intake/Output Summary (Last 24 hours) at 01/19/17 1219 Last data filed at 01/19/17 0900  Gross per 24  hour  Intake                0 ml  Output              500 ml  Net             -500 ml      Examination: General exam: Appears calm and comfortable  Respiratory system: Clear to auscultation. Respiratory effort normal. Cardiovascular system: S1 & S2 heard, RRR. No JVD, murmurs, rubs, gallops or clicks. No pedal edema. Gastrointestinal system: Abdomen is nondistended, soft and nontender. No organomegaly or masses felt. Normal bowel sounds heard. Central nervous system: Alert and oriented. No focal neurological deficits. Extremities: Symmetric 5 x 5 power. Skin: No rashes, lesions or ulcers  Psychiatry: Judgement and insight appear normal. Mood & affect appropriate.     Data Reviewed: I have personally reviewed following labs and imaging studies  Micro Results No results found for this or any previous visit (from the past 240 hour(s)).  Radiology Reports Dg Chest 2 View  Result Date: 01/18/2017 CLINICAL DATA:  Four-day history of intermittent chest pain. Current smoker. EXAM: CHEST  2 VIEW COMPARISON:  12/26/2010. FINDINGS: Cardiomediastinal silhouette unremarkable, unchanged. Lungs clear. Bronchovascular markings normal. Pulmonary vascularity normal. No visible pleural effusions. No pneumothorax. Degenerative changes involving the thoracic and upper lumbar spine. IMPRESSION: No acute cardiopulmonary disease. Electronically Signed   By: Evangeline Dakin M.D.   On:  01/18/2017 18:54     CBC  Recent Labs Lab 01/18/17 2045  WBC 9.1  HGB 14.7  HCT 40.3  PLT 240  MCV 86.9  MCH 31.7  MCHC 36.5*  RDW 12.6    Chemistries   Recent Labs Lab 01/18/17 2045  NA 139  K 4.0  CL 103  CO2 29  GLUCOSE 88  BUN 14  CREATININE 1.15  CALCIUM 10.0  AST 25  ALT 24  ALKPHOS 54  BILITOT 0.5   ------------------------------------------------------------------------------------------------------------------ estimated creatinine clearance is 87.3 mL/min (by C-G formula based on SCr of  1.15 mg/dL). ------------------------------------------------------------------------------------------------------------------ No results for input(s): HGBA1C in the last 72 hours. ------------------------------------------------------------------------------------------------------------------  Recent Labs  01/19/17 0845  CHOL 219*  HDL 33*  LDLCALC 138*  TRIG 241*  CHOLHDL 6.6   ------------------------------------------------------------------------------------------------------------------ No results for input(s): TSH, T4TOTAL, T3FREE, THYROIDAB in the last 72 hours.  Invalid input(s): FREET3 ------------------------------------------------------------------------------------------------------------------ No results for input(s): VITAMINB12, FOLATE, FERRITIN, TIBC, IRON, RETICCTPCT in the last 72 hours.  Coagulation profile No results for input(s): INR, PROTIME in the last 168 hours.  No results for input(s): DDIMER in the last 72 hours.  Cardiac Enzymes  Recent Labs Lab 01/19/17 0627 01/19/17 0849 01/19/17 1102  TROPONINI 0.14* 0.15* 0.12*   ------------------------------------------------------------------------------------------------------------------ Invalid input(s): POCBNP   CBG: No results for input(s): GLUCAP in the last 168 hours.     Studies: Dg Chest 2 View  Result Date: 01/18/2017 CLINICAL DATA:  Four-day history of intermittent chest pain. Current smoker. EXAM: CHEST  2 VIEW COMPARISON:  12/26/2010. FINDINGS: Cardiomediastinal silhouette unremarkable, unchanged. Lungs clear. Bronchovascular markings normal. Pulmonary vascularity normal. No visible pleural effusions. No pneumothorax. Degenerative changes involving the thoracic and upper lumbar spine. IMPRESSION: No acute cardiopulmonary disease. Electronically Signed   By: Evangeline Dakin M.D.   On: 01/18/2017 18:54      No results found for: HGBA1C Lab Results  Component Value Date   LDLCALC  138 (H) 01/19/2017   CREATININE 1.15 01/18/2017       Scheduled Meds: . aspirin EC  325 mg Oral Daily  . atorvastatin  20 mg Oral q1800  . clomiPRAMINE  25 mg Oral QHS  . enoxaparin (LOVENOX) injection  40 mg Subcutaneous Q24H  . pantoprazole  40 mg Oral Daily   Continuous Infusions:   LOS: 0 days    Time spent: >30 MINS    Reyne Dumas  [facility] Pager [phone]. If 7PM-7AM, please contact night-coverage at [email or web address], password [facility] 01/19/2017, 12:19 PM  LOS: 0 days

## 2017-01-19 NOTE — Progress Notes (Signed)
Progress Note  Patient Name: Charles Montoya Date of Encounter: 01/19/2017  Primary Cardiologist: new, Daliyah Sramek  Subjective   46 yo with hx of anxiety and GERD Presents to the hospital last night with episodes of chest burning with radiation down both arms. The pains lasted for about 5-10 minutes. They resolve spontaneously or after drinking some cold water.  He does not get any regular exercise. The episodes occurred at rest and one episode occurred after eating. He has occasional symptoms of reflux disease but these symptoms are completely different than his reflux pain.  Inpatient Medications    Scheduled Meds: . aspirin EC  325 mg Oral Daily  . clomiPRAMINE  25 mg Oral QHS  . enoxaparin (LOVENOX) injection  40 mg Subcutaneous Q24H  . pantoprazole  40 mg Oral Daily   Continuous Infusions:  PRN Meds: acetaminophen, ALPRAZolam, hydrALAZINE, ondansetron (ZOFRAN) IV   Vital Signs    Vitals:   01/19/17 0200 01/19/17 0230 01/19/17 0300 01/19/17 0339  BP: (!) 143/83 127/84 127/83 138/75  Pulse: 73 71 64 66  Resp:      Temp:    98 F (36.7 C)  TempSrc:      SpO2: 97% 98% 98% 98%  Weight:    177 lb 14.4 oz (80.7 kg)  Height:    5\' 11"  (1.803 m)    Intake/Output Summary (Last 24 hours) at 01/19/17 0851 Last data filed at 01/19/17 0756  Gross per 24 hour  Intake                0 ml  Output              500 ml  Net             -500 ml   Filed Weights   01/18/17 1831 01/19/17 0339  Weight: 187 lb (84.8 kg) 177 lb 14.4 oz (80.7 kg)    Telemetry    NSR  - Personally Reviewed  ECG    NSR ,  No acute ST or T wave changes  - Personally Reviewed  Physical Exam   GEN: No acute distress.   Neck: No JVD Cardiac: RRR, no murmurs, rubs, or gallops.  Respiratory: Clear to auscultation bilaterally. GI: Soft, nontender, non-distended  MS: No edema; No deformity. Neuro:  Nonfocal  Psych: Normal affect   Labs    Chemistry Recent Labs Lab 01/18/17 2045  NA 139  K  4.0  CL 103  CO2 29  GLUCOSE 88  BUN 14  CREATININE 1.15  CALCIUM 10.0  PROT 7.5  ALBUMIN 4.7  AST 25  ALT 24  ALKPHOS 54  BILITOT 0.5  GFRNONAA >60  GFRAA >60  ANIONGAP 7     Hematology Recent Labs Lab 01/18/17 2045  WBC 9.1  RBC 4.64  HGB 14.7  HCT 40.3  MCV 86.9  MCH 31.7  MCHC 36.5*  RDW 12.6  PLT 240    Cardiac Enzymes Recent Labs Lab 01/18/17 2105  TROPONINI 0.11*    Recent Labs Lab 01/18/17 2113  TROPIPOC 0.11*     BNPNo results for input(s): BNP, PROBNP in the last 168 hours.   DDimer No results for input(s): DDIMER in the last 168 hours.   Radiology    Dg Chest 2 View  Result Date: 01/18/2017 CLINICAL DATA:  Four-day history of intermittent chest pain. Current smoker. EXAM: CHEST  2 VIEW COMPARISON:  12/26/2010. FINDINGS: Cardiomediastinal silhouette unremarkable, unchanged. Lungs clear. Bronchovascular markings normal. Pulmonary vascularity  normal. No visible pleural effusions. No pneumothorax. Degenerative changes involving the thoracic and upper lumbar spine. IMPRESSION: No acute cardiopulmonary disease. Electronically Signed   By: Evangeline Dakin M.D.   On: 01/18/2017 18:54    Cardiac Studies     Patient Profile     46 y.o. male with hx of smoking and + family hx of CAD. Presents with CP   Assessment & Plan    1.   Unstable angina:   Symptoms are worrisome - chest burning with radiation down both arms. Does not exercise. Episodes occurred while at rest   He has a long history of cigarette smoking and a strong family history of premature coronary artery disease. I think her best option is to proceed with cardiac catheterization. Were waiting for the second troponin level. Initial troponin is 0.11.  I discussed the risks, benefits, and options of heart catheterization and possible PCI. He understands and agrees to proceed.  We'll get a lipid profile.  Signed, Mertie Moores, MD  01/19/2017, 8:51 AM

## 2017-01-19 NOTE — H&P (View-Only) (Signed)
Progress Note  Patient Name: Charles Montoya Date of Encounter: 01/19/2017  Primary Cardiologist: new, Abeer Deskins  Subjective   46 yo with hx of anxiety and GERD Presents to the hospital last night with episodes of chest burning with radiation down both arms. The pains lasted for about 5-10 minutes. They resolve spontaneously or after drinking some cold water.  He does not get any regular exercise. The episodes occurred at rest and one episode occurred after eating. He has occasional symptoms of reflux disease but these symptoms are completely different than his reflux pain.  Inpatient Medications    Scheduled Meds: . aspirin EC  325 mg Oral Daily  . clomiPRAMINE  25 mg Oral QHS  . enoxaparin (LOVENOX) injection  40 mg Subcutaneous Q24H  . pantoprazole  40 mg Oral Daily   Continuous Infusions:  PRN Meds: acetaminophen, ALPRAZolam, hydrALAZINE, ondansetron (ZOFRAN) IV   Vital Signs    Vitals:   01/19/17 0200 01/19/17 0230 01/19/17 0300 01/19/17 0339  BP: (!) 143/83 127/84 127/83 138/75  Pulse: 73 71 64 66  Resp:      Temp:    98 F (36.7 C)  TempSrc:      SpO2: 97% 98% 98% 98%  Weight:    177 lb 14.4 oz (80.7 kg)  Height:    5\' 11"  (1.803 m)    Intake/Output Summary (Last 24 hours) at 01/19/17 0851 Last data filed at 01/19/17 0756  Gross per 24 hour  Intake                0 ml  Output              500 ml  Net             -500 ml   Filed Weights   01/18/17 1831 01/19/17 0339  Weight: 187 lb (84.8 kg) 177 lb 14.4 oz (80.7 kg)    Telemetry    NSR  - Personally Reviewed  ECG    NSR ,  No acute ST or T wave changes  - Personally Reviewed  Physical Exam   GEN: No acute distress.   Neck: No JVD Cardiac: RRR, no murmurs, rubs, or gallops.  Respiratory: Clear to auscultation bilaterally. GI: Soft, nontender, non-distended  MS: No edema; No deformity. Neuro:  Nonfocal  Psych: Normal affect   Labs    Chemistry Recent Labs Lab 01/18/17 2045  NA 139  K  4.0  CL 103  CO2 29  GLUCOSE 88  BUN 14  CREATININE 1.15  CALCIUM 10.0  PROT 7.5  ALBUMIN 4.7  AST 25  ALT 24  ALKPHOS 54  BILITOT 0.5  GFRNONAA >60  GFRAA >60  ANIONGAP 7     Hematology Recent Labs Lab 01/18/17 2045  WBC 9.1  RBC 4.64  HGB 14.7  HCT 40.3  MCV 86.9  MCH 31.7  MCHC 36.5*  RDW 12.6  PLT 240    Cardiac Enzymes Recent Labs Lab 01/18/17 2105  TROPONINI 0.11*    Recent Labs Lab 01/18/17 2113  TROPIPOC 0.11*     BNPNo results for input(s): BNP, PROBNP in the last 168 hours.   DDimer No results for input(s): DDIMER in the last 168 hours.   Radiology    Dg Chest 2 View  Result Date: 01/18/2017 CLINICAL DATA:  Four-day history of intermittent chest pain. Current smoker. EXAM: CHEST  2 VIEW COMPARISON:  12/26/2010. FINDINGS: Cardiomediastinal silhouette unremarkable, unchanged. Lungs clear. Bronchovascular markings normal. Pulmonary vascularity  normal. No visible pleural effusions. No pneumothorax. Degenerative changes involving the thoracic and upper lumbar spine. IMPRESSION: No acute cardiopulmonary disease. Electronically Signed   By: Evangeline Dakin M.D.   On: 01/18/2017 18:54    Cardiac Studies     Patient Profile     45 y.o. male with hx of smoking and + family hx of CAD. Presents with CP   Assessment & Plan    1.   Unstable angina:   Symptoms are worrisome - chest burning with radiation down both arms. Does not exercise. Episodes occurred while at rest   He has a long history of cigarette smoking and a strong family history of premature coronary artery disease. I think her best option is to proceed with cardiac catheterization. Were waiting for the second troponin level. Initial troponin is 0.11.  I discussed the risks, benefits, and options of heart catheterization and possible PCI. He understands and agrees to proceed.  We'll get a lipid profile.  Signed, Mertie Moores, MD  01/19/2017, 8:51 AM

## 2017-01-19 NOTE — Progress Notes (Signed)
   01/19/17 1305  Clinical Encounter Type  Visited With Patient and family together  Visit Type Other (Comment) ([place] consult)  Spiritual Encounters  Spiritual Needs Prayer;Emotional  Stress Factors  Patient Stress Factors Health changes  Family Stress Factors None identified  Introduction to Pt and his mother. Offered prayer of hope and empathic listening.

## 2017-01-19 NOTE — Interval H&P Note (Signed)
Cath Lab Visit (complete for each Cath Lab visit)  Clinical Evaluation Leading to the Procedure:   ACS: Yes.    Non-ACS:    Anginal Classification: CCS IV  Anti-ischemic medical therapy: Minimal Therapy (1 class of medications)  Non-Invasive Test Results: No non-invasive testing performed  Prior CABG: No previous CABG      History and Physical Interval Note:  01/19/2017 6:09 PM  Charles Montoya  has presented today for surgery, with the diagnosis of unstable angina  The various methods of treatment have been discussed with the patient and family. After consideration of risks, benefits and other options for treatment, the patient has consented to  Procedure(s): Left Heart Cath and Coronary Angiography (N/A) as a surgical intervention .  The patient's history has been reviewed, patient examined, no change in status, stable for surgery.  I have reviewed the patient's chart and labs.  Questions were answered to the patient's satisfaction.     Larae Grooms

## 2017-01-19 NOTE — Consult Note (Signed)
Chief Complaint/Reason for Consult: Chest pain  Requesting Physician: Julianne Rice, MD  PCP:  Pa, Weir   Primary Cardiologist: None  HPI: Mr. Coburn is a 46 yo man with history of anxiety, GERD, coming with chest discomfort. Patient reports 3-4 episodes in last 6 days of chest burning and tingling, radiating to both arms. Unrelated to activity. Lasting 5 minutes each time. Denies associated dyspnea, diaphoresis, palpitations. Never had symptoms like that before. Denies other cardiac symptoms in last few months.  Denies cardiac history except of borderline blood pressure. Never had echo, stress test. No cardiologist. His is active smoker and father had CABG in his 3's.   Past Medical History:  Diagnosis Date  . Anxiety     Past Surgical History:  Procedure Laterality Date  . HERNIA REPAIR      Family History  Problem Relation Age of Onset  . CAD Father    Social History:  reports that he has been smoking Cigarettes.  He has been smoking about 1.50 packs per day. He has never used smokeless tobacco. He reports that he does not drink alcohol or use drugs.  Allergies:  Allergies  Allergen Reactions  . Codeine Itching    No current facility-administered medications on file prior to encounter.    Current Outpatient Prescriptions on File Prior to Encounter  Medication Sig Dispense Refill  . ALPRAZolam (XANAX) 0.5 MG tablet Take 0.5 mg by mouth 3 (three) times daily as needed for anxiety. Anxiety     . clomiPRAMINE (ANAFRANIL) 25 MG capsule Take 25 mg by mouth at bedtime.    Marland Kitchen azithromycin (ZITHROMAX Z-PAK) 250 MG tablet 2 po day one, then 1 daily x 4 days (Patient not taking: Reported on 01/18/2017) 5 tablet 0    Results for orders placed or performed during the hospital encounter of 01/18/17 (from the past 48 hour(s))  Basic metabolic panel     Status: None   Collection Time: 01/18/17  8:45 PM  Result Value Ref Range   Sodium 139 135 - 145  mmol/L   Potassium 4.0 3.5 - 5.1 mmol/L   Chloride 103 101 - 111 mmol/L   CO2 29 22 - 32 mmol/L   Glucose, Bld 88 65 - 99 mg/dL   BUN 14 6 - 20 mg/dL   Creatinine, Ser 1.15 0.61 - 1.24 mg/dL   Calcium 10.0 8.9 - 10.3 mg/dL   GFR calc non Af Amer >60 >60 mL/min   GFR calc Af Amer >60 >60 mL/min    Comment: (NOTE) The eGFR has been calculated using the CKD EPI equation. This calculation has not been validated in all clinical situations. eGFR's persistently <60 mL/min signify possible Chronic Kidney Disease.    Anion gap 7 5 - 15  CBC     Status: Abnormal   Collection Time: 01/18/17  8:45 PM  Result Value Ref Range   WBC 9.1 4.0 - 10.5 K/uL   RBC 4.64 4.22 - 5.81 MIL/uL   Hemoglobin 14.7 13.0 - 17.0 g/dL   HCT 40.3 39.0 - 52.0 %   MCV 86.9 78.0 - 100.0 fL   MCH 31.7 26.0 - 34.0 pg   MCHC 36.5 (H) 30.0 - 36.0 g/dL   RDW 12.6 11.5 - 15.5 %   Platelets 240 150 - 400 K/uL  Lipase, blood     Status: None   Collection Time: 01/18/17  8:45 PM  Result Value Ref Range   Lipase 49 11 - 51  U/L  Hepatic function panel     Status: None   Collection Time: 01/18/17  8:45 PM  Result Value Ref Range   Total Protein 7.5 6.5 - 8.1 g/dL   Albumin 4.7 3.5 - 5.0 g/dL   AST 25 15 - 41 U/L   ALT 24 17 - 63 U/L   Alkaline Phosphatase 54 38 - 126 U/L   Total Bilirubin 0.5 0.3 - 1.2 mg/dL   Bilirubin, Direct 0.1 0.1 - 0.5 mg/dL   Indirect Bilirubin 0.4 0.3 - 0.9 mg/dL  Troponin I (q 6hr x 3)     Status: Abnormal   Collection Time: 01/18/17  9:05 PM  Result Value Ref Range   Troponin I 0.11 (HH) <0.03 ng/mL    Comment: CRITICAL RESULT CALLED TO, READ BACK BY AND VERIFIED WITH: Kellie Shropshire RN 2306 01/18/17 A NAVARRO   POCT i-Stat troponin I     Status: Abnormal   Collection Time: 01/18/17  9:13 PM  Result Value Ref Range   Troponin i, poc 0.11 (HH) 0.00 - 0.08 ng/mL   Comment NOTIFIED PHYSICIAN    Comment 3            Comment: Due to the release kinetics of cTnI, a negative result within the  first hours of the onset of symptoms does not rule out myocardial infarction with certainty. If myocardial infarction is still suspected, repeat the test at appropriate intervals.   Urine rapid drug screen (hosp performed)     Status: None   Collection Time: 01/18/17 10:44 PM  Result Value Ref Range   Opiates NONE DETECTED NONE DETECTED   Cocaine NONE DETECTED NONE DETECTED   Benzodiazepines NONE DETECTED NONE DETECTED   Amphetamines NONE DETECTED NONE DETECTED   Tetrahydrocannabinol NONE DETECTED NONE DETECTED   Barbiturates NONE DETECTED NONE DETECTED    Comment:        DRUG SCREEN FOR MEDICAL PURPOSES ONLY.  IF CONFIRMATION IS NEEDED FOR ANY PURPOSE, NOTIFY LAB WITHIN 5 DAYS.        LOWEST DETECTABLE LIMITS FOR URINE DRUG SCREEN Drug Class       Cutoff (ng/mL) Amphetamine      1000 Barbiturate      200 Benzodiazepine   665 Tricyclics       993 Opiates          300 Cocaine          300 THC              50    Dg Chest 2 View  Result Date: 01/18/2017 CLINICAL DATA:  Four-day history of intermittent chest pain. Current smoker. EXAM: CHEST  2 VIEW COMPARISON:  12/26/2010. FINDINGS: Cardiomediastinal silhouette unremarkable, unchanged. Lungs clear. Bronchovascular markings normal. Pulmonary vascularity normal. No visible pleural effusions. No pneumothorax. Degenerative changes involving the thoracic and upper lumbar spine. IMPRESSION: No acute cardiopulmonary disease. Electronically Signed   By: Evangeline Dakin M.D.   On: 01/18/2017 18:54    ECG/TELE: NSR, incomplete RBBB  ROS: As above. Otherwise, review of systems is negative unless per above HPI  Vitals:   01/19/17 0200 01/19/17 0230 01/19/17 0300 01/19/17 0339  BP: (!) 143/83 127/84 127/83   Pulse: 73 71 64 66  Resp:      Temp:    98 F (36.7 C)  TempSrc:      SpO2: 97% 98% 98% 98%  Weight:    80.7 kg (177 lb 14.4 oz)  Height:    5' 11" (1.803  m)   Wt Readings from Last 10 Encounters:  01/19/17 80.7 kg (177 lb  14.4 oz)  06/22/12 79.4 kg (175 lb)    PE:  General: No acute distress HEENT: Atraumatic, EOMI, mucous membranes moist CV: RRR no murmurs, gallops. No JVD. No HJR. Respiratory: Clear, no crackles. Normal work of breathing ABD: Non-distended and non-tender. No palpable organomegaly.  Extremities: 2+ radial pulses bilaterally. No edema. Neuro/Psych: CN grossly intact, alert and oriented  Assessment/Plan Chest pain/Elevated enzymes - Patient with 3-4 episodes of chest discomfort in last few days. Currently pain free. First troponin 0.11. ECG with no acute changes. Risk factors are smoking, family history, possible hypertension although not diagnosed. Enzymes could represent type I event, but also could be related to demand ischemia, possible LVH from undiagnosed hypertension.  - Cycle enzymes, lipid panel, TSH - Echocardiogram- - ASA 337m once, then 81 mg daily - Would wait with heparin for now and wait for second troponin - In AM will decide on stress test vs cath depending on his symptoms and trend of enzymes.   Szymon LAdela Lank MD 01/19/2017, 4:19 AM

## 2017-01-19 NOTE — ED Notes (Signed)
[facility] called for transfer to [place].  

## 2017-01-20 ENCOUNTER — Encounter: Payer: Self-pay | Admitting: Cardiovascular Disease

## 2017-01-20 ENCOUNTER — Inpatient Hospital Stay (HOSPITAL_COMMUNITY): Payer: BLUE CROSS/BLUE SHIELD

## 2017-01-20 ENCOUNTER — Telehealth: Payer: Self-pay | Admitting: Cardiovascular Disease

## 2017-01-20 ENCOUNTER — Encounter (HOSPITAL_COMMUNITY): Payer: Self-pay | Admitting: Interventional Cardiology

## 2017-01-20 DIAGNOSIS — I25110 Atherosclerotic heart disease of native coronary artery with unstable angina pectoris: Principal | ICD-10-CM

## 2017-01-20 DIAGNOSIS — I200 Unstable angina: Secondary | ICD-10-CM

## 2017-01-20 LAB — BASIC METABOLIC PANEL
ANION GAP: 9 (ref 5–15)
BUN: 11 mg/dL (ref 6–20)
CHLORIDE: 106 mmol/L (ref 101–111)
CO2: 22 mmol/L (ref 22–32)
Calcium: 8.8 mg/dL — ABNORMAL LOW (ref 8.9–10.3)
Creatinine, Ser: 1.14 mg/dL (ref 0.61–1.24)
GFR calc Af Amer: >60 mL/min (ref >60)
GFR calc non Af Amer: >60 mL/min (ref >60)
Glucose, Bld: 147 mg/dL — ABNORMAL HIGH (ref 65–99)
POTASSIUM: 3 mmol/L — AB (ref 3.5–5.1)
SODIUM: 137 mmol/L (ref 135–145)

## 2017-01-20 LAB — CBC
HEMATOCRIT: 36.9 % — AB (ref 39.0–52.0)
HEMOGLOBIN: 13 g/dL (ref 13.0–17.0)
MCH: 30.7 pg (ref 26.0–34.0)
MCHC: 35.2 g/dL (ref 30.0–36.0)
MCV: 87 fL (ref 78.0–100.0)
Platelets: 198 10*3/uL (ref 150–400)
RBC: 4.24 MIL/uL (ref 4.22–5.81)
RDW: 12.6 % (ref 11.5–15.5)
WBC: 8.7 10*3/uL (ref 4.0–10.5)

## 2017-01-20 MED ORDER — ATORVASTATIN CALCIUM 20 MG PO TABS: 20.0000 mg | ORAL_TABLET | Freq: Every day | ORAL | 11 refills | Status: DC

## 2017-01-20 MED ORDER — TICAGRELOR 90 MG PO TABS: 90.0000 mg | ORAL_TABLET | Freq: Two times a day (BID) | ORAL | 11 refills | Status: DC

## 2017-01-20 MED ORDER — ANGIOPLASTY BOOK
Freq: Once | Status: DC
  Filled 2017-01-20: qty 1

## 2017-01-20 MED ORDER — ASPIRIN 81 MG PO CHEW: 81.0000 mg | CHEWABLE_TABLET | Freq: Every day | ORAL | 11 refills | Status: DC

## 2017-01-20 MED FILL — Heparin Sodium (Porcine) 2 Unit/ML in Sodium Chloride 0.9%: INTRAMUSCULAR | Qty: 500 | Status: AC

## 2017-01-20 NOTE — Discharge Summary (Signed)
Physician Discharge Summary  Charles Montoya MRN: [id number] DOB/AGE: [birth year] 46 y.o.  PCP: [facility]   Admit date: 01/18/2017 Discharge date: 01/20/2017  Discharge Diagnoses:    Principal Problem:   Chest pain Active Problems:   Tobacco abuse   Cardiac enzymes elevated   Coronary artery disease involving native coronary artery of native heart with unstable angina pectoris ([place])    Follow-up recommendations Follow-up with PCP in 3-5 days , including all  additional recommended appointments as below Follow-up CBC, CMP in 3-5 days Patient needs to continue with dual antiplatelet therapy for 1 year,      Current Discharge Medication List    START taking these medications   Details  aspirin 81 MG chewable tablet Chew 1 tablet (81 mg total) by mouth daily. Qty: 30 tablet, Refills: 11    atorvastatin (LIPITOR) 20 MG tablet Take 1 tablet (20 mg total) by mouth daily at 6 PM. Qty: 30 tablet, Refills: 11    ticagrelor (BRILINTA) 90 MG TABS tablet Take 1 tablet (90 mg total) by mouth 2 (two) times daily. Qty: 60 tablet, Refills: 11      CONTINUE these medications which have NOT CHANGED   Details  ALPRAZolam (XANAX) 0.5 MG tablet Take 0.5 mg by mouth 3 (three) times daily as needed for anxiety. Anxiety     clomiPRAMINE (ANAFRANIL) 25 MG capsule Take 25 mg by mouth at bedtime.    omeprazole (PRILOSEC) 40 MG capsule Take 40 mg by mouth daily.      STOP taking these medications     azithromycin (ZITHROMAX Z-PAK) 250 MG tablet          Discharge Condition:  Patient discharged after being cleared by cardiology  Discharge Instructions Get Medicines reviewed and adjusted: Please take all your medications with you for your next visit with your Primary MD  Please request your Primary MD to go over all hospital tests and procedure/radiological results at the follow up, please ask your Primary MD to get all Hospital records sent to his/her  office.  If you experience worsening of your admission symptoms, develop shortness of breath, life threatening emergency, suicidal or homicidal thoughts you must seek medical attention immediately by calling 911 or calling your MD immediately if symptoms less severe.  You must read complete instructions/literature along with all the possible adverse reactions/side effects for all the Medicines you take and that have been prescribed to you. Take any new Medicines after you have completely understood and accpet all the possible adverse reactions/side effects.   Do not drive when taking Pain medications.   Do not take more than prescribed Pain, Sleep and Anxiety Medications  Special Instructions: If you have smoked or chewed Tobacco in the last 2 yrs please stop smoking, stop any regular Alcohol and or any Recreational drug use.  Wear Seat belts while driving.  Please note  You were cared for by a hospitalist during your hospital stay. Once you are discharged, your primary care physician will handle any further medical issues. Please note that NO REFILLS for any discharge medications will be authorized once you are discharged, as it is imperative that you return to your primary care physician (or establish a relationship with a primary care physician if you do not have one) for your aftercare needs so that they can reassess your need for medications and monitor your lab values.     Allergies  Allergen Reactions  . Codeine Itching  Disposition: 01-Home or Self Care   Consults: * Cardiology    Significant Diagnostic Studies:  Dg Chest 2 View  Result Date: 01/18/2017 CLINICAL DATA:  Four-day history of intermittent chest pain. Current smoker. EXAM: CHEST  2 VIEW COMPARISON:  12/26/2010. FINDINGS: Cardiomediastinal silhouette unremarkable, unchanged. Lungs clear. Bronchovascular markings normal. Pulmonary vascularity normal. No visible pleural effusions. No pneumothorax.  Degenerative changes involving the thoracic and upper lumbar spine. IMPRESSION: No acute cardiopulmonary disease. Electronically Signed   By: Evangeline Dakin M.D.   On: 01/18/2017 18:54    Cardiac catheterization  Dist RCA lesion, 50 %stenosed.  Prox Cx to Mid Cx lesion, 25 %stenosed.  Prox LAD to Mid LAD lesion, 25 %stenosed.  Mid LAD lesion, 40 %stenosed.  RPDA lesion, 90 %stenosed. This is the culprit lesion.    A STENT PROMUS PREM MR 2.5X16 drug eluting stent was successfully placed, and does not overlap previously placed stent.  Post intervention, there is a 0% residual stenosis.  The left ventricular systolic function is normal.  LV end diastolic pressure is normal.  The left ventricular ejection fraction is 55-65% by visual estimate.  There is no aortic valve stenosis   Filed Weights   01/19/17 0339 01/19/17 1917 01/20/17 0620  Weight: 80.7 kg (177 lb 14.4 oz) 80.7 kg (177 lb 14.6 oz) 80.7 kg (177 lb 14.6 oz)        Labs: Results for orders placed or performed during the hospital encounter of 01/18/17 (from the past 48 hour(s))  Basic metabolic panel     Status: None   Collection Time: 01/18/17  8:45 PM  Result Value Ref Range   Sodium 139 135 - 145 mmol/L   Potassium 4.0 3.5 - 5.1 mmol/L   Chloride 103 101 - 111 mmol/L   CO2 29 22 - 32 mmol/L   Glucose, Bld 88 65 - 99 mg/dL   BUN 14 6 - 20 mg/dL   Creatinine, Ser 1.15 0.61 - 1.24 mg/dL   Calcium 10.0 8.9 - 10.3 mg/dL   GFR calc non Af Amer >60 >60 mL/min   GFR calc Af Amer >60 >60 mL/min    Comment: (NOTE) The eGFR has been calculated using the CKD EPI equation. This calculation has not been validated in all clinical situations. eGFR's persistently <60 mL/min signify possible Chronic Kidney Disease.    Anion gap 7 5 - 15  CBC     Status: Abnormal   Collection Time: 01/18/17  8:45 PM  Result Value Ref Range   WBC 9.1 4.0 - 10.5 K/uL   RBC 4.64 4.22 - 5.81 MIL/uL   Hemoglobin 14.7 13.0 - 17.0 g/dL    HCT 40.3 39.0 - 52.0 %   MCV 86.9 78.0 - 100.0 fL   MCH 31.7 26.0 - 34.0 pg   MCHC 36.5 (H) 30.0 - 36.0 g/dL   RDW 12.6 11.5 - 15.5 %   Platelets 240 150 - 400 K/uL  Lipase, blood     Status: None   Collection Time: 01/18/17  8:45 PM  Result Value Ref Range   Lipase 49 11 - 51 U/L  Hepatic function panel     Status: None   Collection Time: 01/18/17  8:45 PM  Result Value Ref Range   Total Protein 7.5 6.5 - 8.1 g/dL   Albumin 4.7 3.5 - 5.0 g/dL   AST 25 15 - 41 U/L   ALT 24 17 - 63 U/L   Alkaline Phosphatase 54 38 - 126 U/L  Total Bilirubin 0.5 0.3 - 1.2 mg/dL   Bilirubin, Direct 0.1 0.1 - 0.5 mg/dL   Indirect Bilirubin 0.4 0.3 - 0.9 mg/dL  Troponin I (q 6hr x 3)     Status: Abnormal   Collection Time: 01/18/17  9:05 PM  Result Value Ref Range   Troponin I 0.11 (HH) <0.03 ng/mL    Comment: CRITICAL RESULT CALLED TO, READ BACK BY AND VERIFIED WITH: Kellie Shropshire RN 2306 01/18/17 A NAVARRO   HIV antibody (Routine Testing)     Status: None   Collection Time: 01/18/17  9:05 PM  Result Value Ref Range   HIV Screen 4th Generation wRfx Non Reactive Non Reactive    Comment: (NOTE) Performed At: [facility] [place], [place] [id number] Lindon Romp MD QD:[id number]   POCT i-Stat troponin I     Status: Abnormal   Collection Time: 01/18/17  9:13 PM  Result Value Ref Range   Troponin i, poc 0.11 (HH) 0.00 - 0.08 ng/mL   Comment NOTIFIED PHYSICIAN    Comment 3            Comment: Due to the release kinetics of cTnI, a negative result within the first hours of the onset of symptoms does not rule out myocardial infarction with certainty. If myocardial infarction is still suspected, repeat the test at appropriate intervals.   Urine rapid drug screen (hosp performed)     Status: None   Collection Time: 01/18/17 10:44 PM  Result Value Ref Range   Opiates NONE DETECTED NONE DETECTED   Cocaine NONE DETECTED NONE DETECTED   Benzodiazepines NONE DETECTED NONE  DETECTED   Amphetamines NONE DETECTED NONE DETECTED   Tetrahydrocannabinol NONE DETECTED NONE DETECTED   Barbiturates NONE DETECTED NONE DETECTED    Comment:        DRUG SCREEN FOR MEDICAL PURPOSES ONLY.  IF CONFIRMATION IS NEEDED FOR ANY PURPOSE, NOTIFY LAB WITHIN 5 DAYS.        LOWEST DETECTABLE LIMITS FOR URINE DRUG SCREEN Drug Class       Cutoff (ng/mL) Amphetamine      1000 Barbiturate      200 Benzodiazepine   375 Tricyclics       436 Opiates          300 Cocaine          300 THC              50   Troponin I     Status: Abnormal   Collection Time: 01/19/17  6:27 AM  Result Value Ref Range   Troponin I 0.14 (HH) <0.03 ng/mL    Comment: CRITICAL VALUE NOTED.  VALUE IS CONSISTENT WITH PREVIOUSLY REPORTED AND CALLED VALUE.  Lipid panel     Status: Abnormal   Collection Time: 01/19/17  8:45 AM  Result Value Ref Range   Cholesterol 219 (H) 0 - 200 mg/dL   Triglycerides 241 (H) <150 mg/dL   HDL 33 (L) >40 mg/dL   Total CHOL/HDL Ratio 6.6 RATIO   VLDL 48 (H) 0 - 40 mg/dL   LDL Cholesterol 138 (H) 0 - 99 mg/dL    Comment:        Total Cholesterol/HDL:CHD Risk Coronary Heart Disease Risk Table                     Men   Women  1/2 Average Risk   3.4   3.3  Average Risk  5.0   4.4  2 X Average Risk   9.6   7.1  3 X Average Risk  23.4   11.0        Use the calculated Patient Ratio above and the CHD Risk Table to determine the patient's CHD Risk.        ATP III CLASSIFICATION (LDL):  <100     mg/dL   Optimal  100-129  mg/dL   Near or Above                    Optimal  130-159  mg/dL   Borderline  160-189  mg/dL   High  >190     mg/dL   Very High   Troponin I     Status: Abnormal   Collection Time: 01/19/17  8:49 AM  Result Value Ref Range   Troponin I 0.15 (HH) <0.03 ng/mL    Comment: CRITICAL RESULT CALLED TO, READ BACK BY AND VERIFIED WITH: Eduard Roux [id number] WILDERK   Troponin I     Status: Abnormal   Collection Time: 01/19/17 11:02 AM  Result  Value Ref Range   Troponin I 0.12 (HH) <0.03 ng/mL    Comment: CRITICAL VALUE NOTED.  VALUE IS CONSISTENT WITH PREVIOUSLY REPORTED AND CALLED VALUE.  Protime-INR     Status: None   Collection Time: 01/19/17 12:34 PM  Result Value Ref Range   Prothrombin Time 13.1 11.4 - 15.2 seconds   INR 0.99   POCT Activated clotting time     Status: None   Collection Time: 01/19/17  6:38 PM  Result Value Ref Range   Activated Clotting Time 263 seconds  Basic metabolic panel     Status: Abnormal   Collection Time: 01/20/17  2:11 AM  Result Value Ref Range   Sodium 137 135 - 145 mmol/L   Potassium 3.0 (L) 3.5 - 5.1 mmol/L   Chloride 106 101 - 111 mmol/L   CO2 22 22 - 32 mmol/L   Glucose, Bld 147 (H) 65 - 99 mg/dL   BUN 11 6 - 20 mg/dL   Creatinine, Ser 1.14 0.61 - 1.24 mg/dL   Calcium 8.8 (L) 8.9 - 10.3 mg/dL   GFR calc non Af Amer >60 >60 mL/min   GFR calc Af Amer >60 >60 mL/min    Comment: (NOTE) The eGFR has been calculated using the CKD EPI equation. This calculation has not been validated in all clinical situations. eGFR's persistently <60 mL/min signify possible Chronic Kidney Disease.    Anion gap 9 5 - 15  CBC     Status: Abnormal   Collection Time: 01/20/17  2:11 AM  Result Value Ref Range   WBC 8.7 4.0 - 10.5 K/uL   RBC 4.24 4.22 - 5.81 MIL/uL   Hemoglobin 13.0 13.0 - 17.0 g/dL   HCT 36.9 (L) 39.0 - 52.0 %   MCV 87.0 78.0 - 100.0 fL   MCH 30.7 26.0 - 34.0 pg   MCHC 35.2 30.0 - 36.0 g/dL   RDW 12.6 11.5 - 15.5 %   Platelets 198 150 - 400 K/uL     Lipid Panel     Component Value Date/Time   CHOL 219 (H) 01/19/2017 0845   TRIG 241 (H) 01/19/2017 0845   HDL 33 (L) 01/19/2017 0845   CHOLHDL 6.6 01/19/2017 0845   VLDL 48 (H) 01/19/2017 0845   LDLCALC 138 (H) 01/19/2017 0845     No results found for: HGBA1C  Lab Results  Component Value Date   LDLCALC 138 (H) 01/19/2017   CREATININE 1.14 01/20/2017     HPI :   Boggus is a 46 yo man with history of anxiety,  GERD, coming with chest discomfort. Patient reports 3-4 episodes in last 6 days of chest burning and tingling, radiating to both arms. Unrelated to activity. Lasting 5 minutes each time. Denies associated dyspnea, diaphoresis, palpitations. Never had symptoms like that before. His is active smoker and father had CABG in his 77's  HOSPITAL COURSE  Unstable angina Mildly positive cardiac enzymes Risk factors include smoking and strong family history of premature coronary artery disease Cardiac cath showed critical stenosis, see report as above Status post PCI Dual antiplatelet therapy recommended by cardiology for 1 year Smoking cessation counseling done Statin started during this hospitalization    Discharge Exam:  Blood pressure 111/73, pulse 74, temperature 97.7 F (36.5 C), temperature source Oral, resp. rate 11, height '5\' 11"'  (1.803 m), weight 80.7 kg (177 lb 14.6 oz), SpO2 98 %.   GEN:No acute distress.   Neck:No JVD Cardiac:RRR, no murmurs, rubs, or gallops.  Respiratory:Clear to auscultation bilaterally. TK:PTWS, nontender, non-distended  MS:No edema; No deformity. Neuro:Nonfocal  Psych: Normal affect    Follow-up Information    [facility], [facility]. Call.   Specialty:  Family Medicine Why:  Hospital follow-up in 3-5 days Contact information: [place] [address] [phone]        Nahser, Wonda Cheng, MD. Call.   Specialty:  Cardiology Why:  Follow-up appointment Contact information: [address] [place] [address] [phone]           Signed: Reyne Dumas 01/20/2017, 8:46 AM        Time spent >1 hour

## 2017-01-20 NOTE — Progress Notes (Signed)
CARDIAC REHAB PHASE I   PRE:  Rate/Rhythm: 93SR  BP:  Supine: 111/73  Sitting:   Standing:    SaO2:   MODE:  Ambulation: 1000 ft   POST:  Rate/Rhythm: 90 SR  BP:  Supine:   Sitting: 151/94  Standing:    SaO2: 100%RA 0805-0910 Pt walked 1000 ft with steady gait. Tolerated well. No CP. Education completed with pt who voiced understanding. Discussed importance of brilinta with stent. Needs to see case manager. Discussed risk factors, smoking cessation, NTG use, heart healthy diet, ex ed. Gave pt smoking cessation handout and fake cigarette. He is going to use nicotine patches and gum. Notified his RN that he would like a patch now. Discussed CRP 2 and will refer to [place].   Graylon Good, RN BSN  01/20/2017 9:04 AM

## 2017-01-20 NOTE — Telephone Encounter (Signed)
New message    TOC appt made with Charles Montoya on 7/10 at 9am.

## 2017-01-20 NOTE — Telephone Encounter (Signed)
Per review of Pt chart, Pt remains inpatient at this time-will be discharged today 01/20/2017.  Will continue to follow for TCM needs.

## 2017-01-20 NOTE — Care Management Note (Addendum)
Case Management Note  Patient Details  Name: Charles Montoya MRN: [id number] Date of Birth: 1971-02-21  Subjective/Objective:  Pt presented for CAD- s/p stenting to RCA. Plan for home on Brilinta. Benefits check completed. CM to make pt aware of cost and provide co pay card. Pt uses [facility] and medication is available. Rx was e scribed to pharmacy.                   Action/Plan:Brilinta 90mg  PO BID does not require prior authorization. Brilinita is covered and patient may use [employer] at a copay of $80 for a 30 day supply  Expected Discharge Date:                  Expected Discharge Plan:  Home/Self Care  In-House Referral:  NA  Discharge planning Services  CM Consult, Medication Assistance  Post Acute Care Choice:  NA Choice offered to:  NA  DME Arranged:  N/A DME Agency:  NA  HH Arranged:  NA HH Agency:  NA  Status of Service:  Completed, signed off  If discussed at [place] of Stay Meetings, dates discussed:    Additional Comments:  Bethena Roys, RN 01/20/2017, 10:08 AM

## 2017-01-20 NOTE — Progress Notes (Signed)
Progress Note  Patient Name: Charles Montoya Date of Encounter: 01/20/2017  Primary Cardiologist: Kaleth Koy   Subjective   Charles Montoya is a 46 yo Admitted with UAP  Had stenting of his RCA    Inpatient Medications    Scheduled Meds: . angioplasty book   Does not apply Once  . aspirin  81 mg Oral Daily  . atorvastatin  20 mg Oral q1800  . clomiPRAMINE  25 mg Oral QHS  . enoxaparin (LOVENOX) injection  40 mg Subcutaneous Q24H  . pantoprazole  40 mg Oral Daily  . sodium chloride flush  3 mL Intravenous Q12H  . ticagrelor  90 mg Oral BID   Continuous Infusions: . sodium chloride     PRN Meds: sodium chloride, acetaminophen, ALPRAZolam, hydrALAZINE, ondansetron (ZOFRAN) IV, sodium chloride flush   Vital Signs    Vitals:   01/20/17 0100 01/20/17 0200 01/20/17 0620 01/20/17 0800  BP: (!) 158/94 (!) 137/92 127/84 111/73  Pulse: 74 74 61 74  Resp: 13 15 13 11   Temp:   97.9 F (36.6 C) 97.7 F (36.5 C)  TempSrc:   Oral Oral  SpO2: 99% 98% 98% 98%  Weight:   177 lb 14.6 oz (80.7 kg)   Height:        Intake/Output Summary (Last 24 hours) at 01/20/17 0951 Last data filed at 01/20/17 0030  Gross per 24 hour  Intake              880 ml  Output             1300 ml  Net             -420 ml   Filed Weights   01/19/17 0339 01/19/17 1917 01/20/17 0620  Weight: 177 lb 14.4 oz (80.7 kg) 177 lb 14.6 oz (80.7 kg) 177 lb 14.6 oz (80.7 kg)    Telemetry    NSR  - Personally Reviewed  ECG    NSR  - Personally Reviewed  Physical Exam   GEN: No acute distress.   Neck: No JVD Cardiac: RRR, no murmurs, rubs, or gallops.  Respiratory: Clear to auscultation bilaterally. GI: Soft, nontender, non-distended  MS: No edema; No deformity.  Right radial cath site looks good Neuro:  Nonfocal  Psych: Normal affect   Labs    Chemistry Recent Labs Lab 01/18/17 2045 01/20/17 0211  NA 139 137  K 4.0 3.0*  CL 103 106  CO2 29 22  GLUCOSE 88 147*  BUN 14 11  CREATININE 1.15 1.14    CALCIUM 10.0 8.8*  PROT 7.5  --   ALBUMIN 4.7  --   AST 25  --   ALT 24  --   ALKPHOS 54  --   BILITOT 0.5  --   GFRNONAA >60 >60  GFRAA >60 >60  ANIONGAP 7 9     Hematology Recent Labs Lab 01/18/17 2045 01/20/17 0211  WBC 9.1 8.7  RBC 4.64 4.24  HGB 14.7 13.0  HCT 40.3 36.9*  MCV 86.9 87.0  MCH 31.7 30.7  MCHC 36.5* 35.2  RDW 12.6 12.6  PLT 240 198    Cardiac Enzymes Recent Labs Lab 01/18/17 2105 01/19/17 0627 01/19/17 0849 01/19/17 1102  TROPONINI 0.11* 0.14* 0.15* 0.12*    Recent Labs Lab 01/18/17 2113  TROPIPOC 0.11*     BNPNo results for input(s): BNP, PROBNP in the last 168 hours.   DDimer No results for input(s): DDIMER in the last 168 hours.  Radiology    Dg Chest 2 View  Result Date: 01/18/2017 CLINICAL DATA:  Four-day history of intermittent chest pain. Current smoker. EXAM: CHEST  2 VIEW COMPARISON:  12/26/2010. FINDINGS: Cardiomediastinal silhouette unremarkable, unchanged. Lungs clear. Bronchovascular markings normal. Pulmonary vascularity normal. No visible pleural effusions. No pneumothorax. Degenerative changes involving the thoracic and upper lumbar spine. IMPRESSION: No acute cardiopulmonary disease. Electronically Signed   By: Evangeline Dakin M.D.   On: 01/18/2017 18:54    Cardiac Studies     Patient Profile     46 y.o. male  With UAP  Assessment & Plan    1. CAD:  S/p stenting of distal RCA . Has mild - moderate disease  Stressed the importance of taking brilinta and asa Will need NTG also  Will see him in the clinic in several months  Should see an APP in several weeks .    Signed, Mertie Moores, MD  01/20/2017, 9:51 AM

## 2017-01-20 NOTE — Progress Notes (Signed)
TR BAND REMOVAL  LOCATION:    R radial  DEFLATED PER PROTOCOL:    Yes  TIME BAND OFF / DRESSING APPLIED:  00:45  SITE UPON ARRIVAL:    Level 0  SITE AFTER BAND REMOVAL:    Level  1  CIRCULATION SENSATION AND MOVEMENT:    Within Normal Limits  Yes  COMMENTS:   Pt tolerated procedure. VSS  C[place]an pressure dressing removed below band site prior to removal. Bruising noted.

## 2017-01-21 ENCOUNTER — Telehealth: Payer: Self-pay | Admitting: Cardiovascular Disease

## 2017-01-21 ENCOUNTER — Telehealth (HOSPITAL_COMMUNITY): Payer: Self-pay

## 2017-01-21 ENCOUNTER — Telehealth: Payer: Self-pay

## 2017-01-21 MED ORDER — NITROGLYCERIN 0.4 MG SL SUBL: 0.4000 mg | SUBLINGUAL_TABLET | SUBLINGUAL | 3 refills | Status: DC | PRN

## 2017-01-21 NOTE — Telephone Encounter (Signed)
Patient contacted regarding discharge from [facility] on 01/20/2017. Patient understands to follow up with provider Vin Baghat on 02/01/2017 at 0900 at [facility].  Patient understands discharge instructions? yes Patient understands medications and regiment? yes Patient understands to bring all medications to this visit? Yes  TCM call made to patient.  Patient had called this am to get nitro prescription.  Briefly discussed medication as patient had already spoken with nurse.  Pt indicates understanding of nitro teaching.  Pt states he has all of his medications.  Discussed all new medications with patient, explained what medication was and why it was important.  Pt with history of anxiety, now s/p chest pain with stent he is even more anxious.  Told Pt it is common to be anxious after having chest pain and requiring a stent.  Pt states he has not smoked since hospitalization, has been using fake cig provided by hospital and nicotine gum.  Pt states his wrist is a little sore and asked if this was normal.  Provided reassurance that this was normal, to continue to monitor cath site and no lifting >10 pounds for 5 days.  Pt indicates understanding.  Gave Pt this nurse name and # for any further questions.

## 2017-01-21 NOTE — Telephone Encounter (Signed)
Spoke with Dr. Irish Lack who cathed pt and he said ok to send in [facility].  Spoke with pt and made him aware.  Went over instructions again with pt.  Pt verbalized understanding and was appreciative for call.

## 2017-01-21 NOTE — Telephone Encounter (Signed)
New message     Pt was told if he has chest pain to take Nitro he does not have prescription for the nitro ?  He is confused does he need to take this or will it be okay to do without it.  Not actively having chest pain , he is just following up making sure he has everything straight

## 2017-01-21 NOTE — Telephone Encounter (Signed)
Patient insurance is active and benefits verified. Patient has [employer] - no-copayment, deductible $400/$0 has been met, out of pocket $800/$238 has been met, 30% co-insurance, no pre-authorization and no limit on visit. Passport/reference [phone].

## 2017-01-25 DIAGNOSIS — I2510 Atherosclerotic heart disease of native coronary artery without angina pectoris: Secondary | ICD-10-CM | POA: Diagnosis not present

## 2017-01-25 DIAGNOSIS — E7800 Pure hypercholesterolemia, unspecified: Secondary | ICD-10-CM | POA: Diagnosis not present

## 2017-01-27 ENCOUNTER — Encounter: Payer: Self-pay | Admitting: Physician Assistant

## 2017-01-27 NOTE — Progress Notes (Signed)
Cardiology Office Note    Date:  02/01/2017   ID:  Charles Montoya, DOB 19-Jan-1971, MRN [id number]  PCP:  London Pepper, MD  Cardiologist:  Dr. Acie Fredrickson  Chief Complaint: Hospital follow up for chest pain   History of Present Illness:   Charles Montoya is a 46 y.o. male who recently admitted for unstable angina presented for follow up.   Admitted 6/26-6/28. Peak of troponin 0.15. Cath showed 90%  RPDA lesion s/p DES. Medical therapy for residual mild to moderate disease.   Here today for follow up. No further dyspnea or chest pain. He has a lot of questions today, all answered. He has stopped smoking. The patient denies nausea, vomiting, fever, chest pain, palpitations, shortness of breath, orthopnea, PND, dizziness, syncope, cough, congestion, abdominal pain, hematochezia, melena, lower extremity edema.  Past Medical History:  Diagnosis Date  . Anxiety   . CAD (coronary artery disease)    a. cath 12/2016--> s/p DES to RPDA, 50% dRCA, 25% pLAD, 40% mLAD, 25% pro to mid CX  . Tobacco abuse     Past Surgical History:  Procedure Laterality Date  . CORONARY STENT INTERVENTION N/A 01/19/2017   Procedure: Coronary Stent Intervention;  Surgeon: Jettie Booze, MD;  Location: [facility];  Service: Cardiovascular;  Laterality: N/A;  . HERNIA REPAIR    . LEFT HEART CATH AND CORONARY ANGIOGRAPHY N/A 01/19/2017   Procedure: Left Heart Cath and Coronary Angiography;  Surgeon: Jettie Booze, MD;  Location: [facility];  Service: Cardiovascular;  Laterality: N/A;    Current Medications: Prior to Admission medications   Medication Sig Start Date End Date Taking? Authorizing Provider  ALPRAZolam Duanne Moron) 0.5 MG tablet Take 0.5 mg by mouth 3 (three) times daily as needed for anxiety. Anxiety     [provider]  aspirin 81 MG chewable tablet Chew 1 tablet (81 mg total) by mouth daily. 01/20/17   Reyne Dumas, MD  atorvastatin (LIPITOR) 20 MG tablet Take 1 tablet (20  mg total) by mouth daily at 6 PM. 01/20/17   Reyne Dumas, MD  clomiPRAMINE (ANAFRANIL) 25 MG capsule Take 25 mg by mouth at bedtime.    [provider]  nitroGLYCERIN (NITROSTAT) 0.4 MG SL tablet Place 1 tablet (0.4 mg total) under the tongue every 5 (five) minutes as needed for chest pain. 01/21/17 04/21/17  Jettie Booze, MD  omeprazole (PRILOSEC) 40 MG capsule Take 40 mg by mouth daily.    [provider]  ticagrelor (BRILINTA) 90 MG TABS tablet Take 1 tablet (90 mg total) by mouth 2 (two) times daily. 01/20/17   Reyne Dumas, MD    Allergies:   Codeine   Social History   Social History  . Marital status: Single    Spouse name: N/A  . Number of children: N/A  . Years of education: N/A   Social History Main Topics  . Smoking status: Current Every Day Smoker    Packs/day: 1.50    Types: Cigarettes  . Smokeless tobacco: Never Used  . Alcohol use No  . Drug use: No  . Sexual activity: Not Asked   Other Topics Concern  . None   Social History Narrative  . None     Family History:  The patient's family history includes CAD in his father; Hypertension in his mother.   ROS:   Please see the history of present illness.    ROS All other systems reviewed and are negative.  PHYSICAL EXAM:   VS:  BP 122/80   Pulse 72   Ht 5\' 11"  (1.803 m)   Wt 178 lb 6.4 oz (80.9 kg)   BMI 24.88 kg/m    GEN: Well nourished, well developed, in no acute distress  HEENT: normal  Neck: no JVD, carotid bruits, or masses Cardiac: RRR; no murmurs, rubs, or gallops,no edema  Respiratory:  clear to auscultation bilaterally, normal work of breathing GI: soft, nontender, nondistended, + BS MS: no deformity or atrophy  Skin: warm and dry, no rash Neuro:  Alert and Oriented x 3, Strength and sensation are intact Psych: euthymic mood, full affect  Wt Readings from Last 3 Encounters:  02/01/17 178 lb 6.4 oz (80.9 kg)  01/20/17 177 lb 14.6 oz (80.7 kg)  06/22/12 175 lb  (79.4 kg)      Studies/Labs Reviewed:   EKG:  EKG is not ordered today.   Recent Labs: 01/18/2017: ALT 24 01/20/2017: BUN 11; Creatinine, Ser 1.14; Hemoglobin 13.0; Platelets 198; Potassium 3.0; Sodium 137   Lipid Panel    Component Value Date/Time   CHOL 219 (H) 01/19/2017 0845   TRIG 241 (H) 01/19/2017 0845   HDL 33 (L) 01/19/2017 0845   CHOLHDL 6.6 01/19/2017 0845   VLDL 48 (H) 01/19/2017 0845   LDLCALC 138 (H) 01/19/2017 0845    Additional studies/ records that were reviewed today include:   Cardiac catheterization 01/19/2017  Dist RCA lesion, 50 %stenosed.  Prox Cx to Mid Cx lesion, 25 %stenosed.  Prox LAD to Mid LAD lesion, 25 %stenosed.  Mid LAD lesion, 40 %stenosed.  RPDA lesion, 90 %stenosed. This is the culprit lesion.    A STENT PROMUS PREM MR 2.5X16 drug eluting stent was successfully placed, and does not overlap previously placed stent.  Post intervention, there is a 0% residual stenosis.  The left ventricular systolic function is normal.  LV end diastolic pressure is normal.  The left ventricular ejection fraction is 55-65% by visual estimate.  There is no aortic valve stenosis  Diagnostic Diagram       Post-Intervention Diagram           ASSESSMENT & PLAN:    1. CAD s/p DES to RPDA - Medically therapy for residual disease as noted above. Continue ASA, Brillinta and statin. CRP II.   2. HLD - 01/19/2017: Cholesterol 219; HDL 33; LDL Cholesterol 138; Triglycerides 241; VLDL 48 - Will increase lipitor to 40mg  qd (not interested in 80mg  qd). Repeat labs in 4 weeks.   3. Tobacco abuse - Congratulated on tobacco cessation.   Medication Adjustments/Labs and Tests Ordered: Current medicines are reviewed at length with the patient today.  Concerns regarding medicines are outlined above.  Medication changes, Labs and Tests ordered today are listed in the Patient Instructions below. Patient Instructions  Medication Instructions:  Your  physician has recommended you make the following change in your medication:  1- INCREASE Lipitor 40 mg by mouth daily   Labwork: Your physician recommends that you return for lab work in: 4 to 6 weeks for lipid and liver panel   Testing/Procedures: NONE  Follow-Up: Your physician recommends that you schedule a follow-up appointment in: 3 to 4 months with Dr. Acie Fredrickson.   If you need a refill on your cardiac medications before your next appointment, please call your pharmacy.      Jarrett Soho, [place]  02/01/2017 9:07 AM    [facility] [place], [place], [place]  [address]  Phone: [phone]; Fax: [phone]

## 2017-02-01 ENCOUNTER — Ambulatory Visit (INDEPENDENT_AMBULATORY_CARE_PROVIDER_SITE_OTHER): Payer: BLUE CROSS/BLUE SHIELD | Admitting: Physician Assistant

## 2017-02-01 ENCOUNTER — Encounter: Payer: Self-pay | Admitting: Physician Assistant

## 2017-02-01 VITALS — BP 122/80 | HR 72 | Ht 71.0 in | Wt 178.4 lb

## 2017-02-01 DIAGNOSIS — E782 Mixed hyperlipidemia: Secondary | ICD-10-CM | POA: Insufficient documentation

## 2017-02-01 DIAGNOSIS — E785 Hyperlipidemia, unspecified: Secondary | ICD-10-CM | POA: Diagnosis not present

## 2017-02-01 DIAGNOSIS — I25110 Atherosclerotic heart disease of native coronary artery with unstable angina pectoris: Secondary | ICD-10-CM

## 2017-02-01 DIAGNOSIS — Z720 Tobacco use: Secondary | ICD-10-CM | POA: Diagnosis not present

## 2017-02-01 MED ORDER — ATORVASTATIN CALCIUM 40 MG PO TABS: 40.0000 mg | ORAL_TABLET | Freq: Every day | ORAL | 3 refills | Status: DC

## 2017-02-01 NOTE — Patient Instructions (Addendum)
Medication Instructions:  Your physician has recommended you make the following change in your medication:  1- INCREASE Lipitor 40 mg by mouth daily   Labwork: Your physician recommends that you return for lab work in: 4 to 6 weeks for lipid and liver panel   Testing/Procedures: NONE  Follow-Up: Your physician recommends that you schedule a follow-up appointment in: 3 to 4 months with Dr. Acie Fredrickson.  You have been referred to Cardiac Rehab- they will call you to schedule.    If you need a refill on your cardiac medications before your next appointment, please call your pharmacy.

## 2017-02-03 DIAGNOSIS — F4000 Agoraphobia, unspecified: Secondary | ICD-10-CM | POA: Diagnosis not present

## 2017-02-04 ENCOUNTER — Telehealth (HOSPITAL_COMMUNITY): Payer: Self-pay

## 2017-02-04 NOTE — Telephone Encounter (Signed)
I called and left message on patient voicemail to call office about scheduling for cardiac rehab. I left office contact information on patient voicemail to return call.  ° °

## 2017-02-09 ENCOUNTER — Telehealth (HOSPITAL_COMMUNITY): Payer: Self-pay

## 2017-02-09 NOTE — Telephone Encounter (Signed)
*  Updated insurance verification* [employer] - no co-payment, deductible $400/$400 has been met, out of pocket $800/$800 has been met, 30% co-insurance, no pre-authorization and no limit on visit. Passport/reference [phone].

## 2017-02-10 ENCOUNTER — Encounter (HOSPITAL_COMMUNITY)
Admission: RE | Admit: 2017-02-10 | Discharge: 2017-02-10 | Disposition: A | Discharge status: Home / Self Care | Payer: BLUE CROSS/BLUE SHIELD | Source: Ambulatory Visit | Attending: Cardiovascular Disease | Admitting: Cardiovascular Disease

## 2017-02-10 ENCOUNTER — Encounter (HOSPITAL_COMMUNITY): Payer: Self-pay

## 2017-02-10 VITALS — BP 122/82 | HR 60 | Ht 70.5 in | Wt 179.2 lb

## 2017-02-10 DIAGNOSIS — R748 Abnormal levels of other serum enzymes: Secondary | ICD-10-CM | POA: Diagnosis not present

## 2017-02-10 DIAGNOSIS — Z955 Presence of coronary angioplasty implant and graft: Secondary | ICD-10-CM

## 2017-02-10 NOTE — Progress Notes (Addendum)
Charles Montoya 46 y.o. male       Nutrition Screen & Note  1. S/P drug eluting coronary stent placement 01/19/2017    Past Medical History:  Diagnosis Date  . Anxiety   . CAD (coronary artery disease)    a. cath 12/2016--> s/p DES to RPDA, 50% dRCA, 25% pLAD, 40% mLAD, 25% pro to mid CX  . Tobacco abuse    Meds reviewed  HT: Ht Readings from Last 1 Encounters:  02/10/17 5' 10.5" (1.791 m)    WT: Wt Readings from Last 3 Encounters:  02/10/17 179 lb 3.7 oz (81.3 kg)  02/01/17 178 lb 6.4 oz (80.9 kg)  01/20/17 177 lb 14.6 oz (80.7 kg)     BMI 25.4   Current tobacco use? No     Recently quit tobacco use 01/19/17  Labs:  Lipid Panel     Component Value Date/Time   CHOL 219 (H) 01/19/2017 0845   TRIG 241 (H) 01/19/2017 0845   HDL 33 (L) 01/19/2017 0845   CHOLHDL 6.6 01/19/2017 0845   VLDL 48 (H) 01/19/2017 0845   LDLCALC 138 (H) 01/19/2017 0845    No results found for: HGBA1C CBG (last 3)  No results for input(s): GLUCAP in the last 72 hours.  Nutrition Diagnosis ? Food-and nutrition-related knowledge deficit related to lack of exposure to information as related to diagnosis of: ? CVD  ? Overweight related to excessive energy intake as evidenced by a BMI of 25.4  Nutrition Goal(s):  ? Wt loss of 1-2 lb/week to a wt loss goal of 6-10 lb at graduation from [place].  Plan:  Pt to attend nutrition classes ? Nutrition I ? Nutrition II ? Portion Distortion  Will provide client-centered nutrition education as part of interdisciplinary care.   Monitor and evaluate progress toward nutrition goal with team.  Derek Mound, M.Ed, RD, LDN, CDE 02/10/2017 2:14 PM

## 2017-02-10 NOTE — Progress Notes (Signed)
Cardiac Individual Treatment Plan  Patient Details  Name: Charles Montoya MRN: [id number] Date of Birth: 04/12/1971 Referring Provider:     CARDIAC REHAB PHASE II ORIENTATION from 02/10/2017 in [place]  Referring Provider  Grayland Jack MD      Initial Encounter Date:    CARDIAC REHAB PHASE II ORIENTATION from 02/10/2017 in [place]  Date  02/10/17  Referring Provider  Nahser, Doren Custard MD      Visit Diagnosis: S/P drug eluting coronary stent placement 01/19/2017  Patient's Home Medications on Admission:  Current Outpatient Prescriptions:  .  ALPRAZolam (XANAX) 0.5 MG tablet, Take 0.5 mg by mouth 3 (three) times daily as needed for anxiety. Anxiety , Disp: , Rfl:  .  aspirin 81 MG chewable tablet, Chew 1 tablet (81 mg total) by mouth daily., Disp: 30 tablet, Rfl: 11 .  atorvastatin (LIPITOR) 40 MG tablet, Take 1 tablet (40 mg total) by mouth daily at 6 PM., Disp: 90 tablet, Rfl: 3 .  clomiPRAMINE (ANAFRANIL) 25 MG capsule, Take 25 mg by mouth at bedtime., Disp: , Rfl:  .  nicotine polacrilex (NICORETTE) 4 MG gum, Take 4 mg by mouth as needed for smoking cessation., Disp: , Rfl:  .  nitroGLYCERIN (NITROSTAT) 0.4 MG SL tablet, Place 1 tablet (0.4 mg total) under the tongue every 5 (five) minutes as needed for chest pain., Disp: 25 tablet, Rfl: 3 .  ticagrelor (BRILINTA) 90 MG TABS tablet, Take 1 tablet (90 mg total) by mouth 2 (two) times daily., Disp: 60 tablet, Rfl: 11 .  omeprazole (PRILOSEC) 40 MG capsule, Take 40 mg by mouth as needed (HEARTBURN/ UPSET STOMACH). , Disp: , Rfl:   Past Medical History: Past Medical History:  Diagnosis Date  . Anxiety   . CAD (coronary artery disease)    a. cath 12/2016--> s/p DES to RPDA, 50% dRCA, 25% pLAD, 40% mLAD, 25% pro to mid CX  . Tobacco abuse     Tobacco Use: History  Smoking Status  . Former Smoker  . Packs/day: 1.50  . Types: Cigarettes  . Quit date: 01/19/2017   Smokeless Tobacco  . Never Used    Comment: Using nicoderm gum    Labs: Recent Review Flowsheet Data    Labs for ITP Cardiac and Pulmonary Rehab Latest Ref Rng & Units 01/19/2017   Cholestrol 0 - 200 mg/dL 219(H)   LDLCALC 0 - 99 mg/dL 138(H)   HDL >40 mg/dL 33(L)   Trlycerides <150 mg/dL 241(H)      Capillary Blood Glucose: No results found for: GLUCAP   Exercise Target Goals: Date: 02/10/17  Exercise Program Goal: Individual exercise prescription set with THRR, safety & activity barriers. Participant demonstrates ability to understand and report RPE using BORG scale, to self-measure pulse accurately, and to acknowledge the importance of the exercise prescription.  Exercise Prescription Goal: Starting with aerobic activity 30 plus minutes a day, 3 days per week for initial exercise prescription. Provide home exercise prescription and guidelines that participant acknowledges understanding prior to discharge.  Activity Barriers & Risk Stratification:     Activity Barriers & Cardiac Risk Stratification - 02/10/17 1427      Activity Barriers & Cardiac Risk Stratification   Activity Barriers Other (comment)   Comments Occasional hip pain   Cardiac Risk Stratification Moderate      6 Minute Walk:     6 Minute Walk    Row Name 02/10/17 1112  6 Minute Walk   Phase Initial     Distance 1636 feet     Walk Time 6 minutes     # of Rest Breaks 0     MPH 3.1     METS 5     RPE 11     VO2 Peak 17.3     Symptoms No     Resting HR 60 bpm     Resting BP 122/82     Max Ex. HR 87 bpm     Max Ex. BP 128/86     2 Minute Post BP 124/82        Oxygen Initial Assessment:   Oxygen Re-Evaluation:   Oxygen Discharge (Final Oxygen Re-Evaluation):   Initial Exercise Prescription:     Initial Exercise Prescription - 02/10/17 1100      Date of Initial Exercise RX and Referring Provider   Date 02/10/17   Referring Provider Nahser, Doren Custard MD     Treadmill    MPH 3.2   Grade 1   Minutes 10   METs 3.4     Bike   Level 1   Minutes 10   METs 3.2     NuStep   Level 3   SPM 85   Minutes 10   METs 3     Prescription Details   Frequency (times per week) 3   Duration Progress to 45 minutes of aerobic exercise without signs/symptoms of physical distress     Intensity   THRR 40-80% of Max Heartrate 70-141   Ratings of Perceived Exertion 11-13   Perceived Dyspnea 0-4     Progression   Progression Continue to progress workloads to maintain intensity without signs/symptoms of physical distress.     Resistance Training   Training Prescription Yes   Weight 3   Reps 10-15      Perform Capillary Blood Glucose checks as needed.  Exercise Prescription Changes:   Exercise Comments:   Exercise Goals and Review:      Exercise Goals    Row Name 02/10/17 1120             Exercise Goals   Increase Physical Activity Yes       Intervention Provide advice, education, support and counseling about physical activity/exercise needs.;Develop an individualized exercise prescription for aerobic and resistive training based on initial evaluation findings, risk stratification, comorbidities and participant's personal goals.       Expected Outcomes Achievement of increased cardiorespiratory fitness and enhanced flexibility, muscular endurance and strength shown through measurements of functional capacity and personal statement of participant.       Increase Strength and Stamina Yes       Intervention Provide advice, education, support and counseling about physical activity/exercise needs.;Develop an individualized exercise prescription for aerobic and resistive training based on initial evaluation findings, risk stratification, comorbidities and participant's personal goals.       Expected Outcomes Achievement of increased cardiorespiratory fitness and enhanced flexibility, muscular endurance and strength shown through measurements of functional  capacity and personal statement of participant.          Exercise Goals Re-Evaluation :    Discharge Exercise Prescription (Final Exercise Prescription Changes):   Nutrition:  Target Goals: Understanding of nutrition guidelines, daily intake of sodium 1500mg , cholesterol 200mg , calories 30% from fat and 7% or less from saturated fats, daily to have 5 or more servings of fruits and vegetables.  Biometrics:     Pre Biometrics - 02/10/17 1423  Pre Biometrics   Height 5' 10.5" (1.791 m)   Weight 179 lb 3.7 oz (81.3 kg)   Waist Circumference 35 inches   Hip Circumference 40 inches   Waist to Hip Ratio 0.88 %   BMI (Calculated) 25.4   Triceps Skinfold 13 mm   % Body Fat 22.7 %   Grip Strength 44 kg   Flexibility 13 in   Single Leg Stand 30 seconds       Nutrition Therapy Plan and Nutrition Goals:     Nutrition Therapy & Goals - 02/10/17 1417      Nutrition Therapy   Diet Therapeutic Lifestyle Changes     Personal Nutrition Goals   Nutrition Goal Wt loss of 1-2 lb/week to a wt loss goal of 6-10 lb at graduation from [facility], educate and counsel regarding individualized specific dietary modifications aiming towards targeted core components such as weight, hypertension, lipid management, diabetes, heart failure and other comorbidities.   Expected Outcomes Short Term Goal: Understand basic principles of dietary content, such as calories, fat, sodium, cholesterol and nutrients.;Long Term Goal: Adherence to prescribed nutrition plan.      Nutrition Discharge: Nutrition Scores:     Nutrition Assessments - 02/10/17 1452      MEDFICTS Scores   Pre Score 6      Nutrition Goals Re-Evaluation:   Nutrition Goals Re-Evaluation:   Nutrition Goals Discharge (Final Nutrition Goals Re-Evaluation):   Psychosocial: Target Goals: Acknowledge presence or absence of significant depression and/or stress, maximize  coping skills, provide positive support system. Participant is able to verbalize types and ability to use techniques and skills needed for reducing stress and depression.  Initial Review & Psychosocial Screening:     Initial Psych Review & Screening - 02/10/17 1422      Initial Review   Current issues with None Identified     Family Dynamics   Good Support System? Yes     Barriers   Psychosocial barriers to participate in program There are no identifiable barriers or psychosocial needs.      Quality of Life Scores:     Quality of Life - 02/10/17 1121      Quality of Life Scores   Health/Function Pre 21.2 %   Socioeconomic Pre 21.36 %   Psych/Spiritual Pre 20.57 %   Family Pre 20 %   GLOBAL Pre 20.98 %      PHQ-9: Recent Review Flowsheet Data    There is no flowsheet data to display.     Interpretation of Total Score  Total Score Depression Severity:  1-4 = Minimal depression, 5-9 = Mild depression, 10-14 = Moderate depression, 15-19 = Moderately severe depression, 20-27 = Severe depression   Psychosocial Evaluation and Intervention:   Psychosocial Re-Evaluation:   Psychosocial Discharge (Final Psychosocial Re-Evaluation):   Vocational Rehabilitation: Provide vocational rehab assistance to qualifying candidates.   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      Education: Education Goals: Education classes will be provided on a weekly basis, covering required topics. Participant will state understanding/return demonstration of topics presented.  Learning Barriers/Preferences:     Learning Barriers/Preferences - 02/10/17 1420      Learning  Barriers/Preferences   Learning Barriers None   Learning  Preferences None      Education Topics: Count Your Pulse:  -Group instruction provided by verbal instruction, demonstration, patient participation and written materials to support subject.  Instructors address importance of being able to find your pulse and how to count your pulse when at home without a heart monitor.  Patients get hands on experience counting their pulse with staff help and individually.   Heart Attack, Angina, and Risk Factor Modification:  -Group instruction provided by verbal instruction, video, and written materials to support subject.  Instructors address signs and symptoms of angina and heart attacks.    Also discuss risk factors for heart disease and how to make changes to improve heart health risk factors.   Functional Fitness:  -Group instruction provided by verbal instruction, demonstration, patient participation, and written materials to support subject.  Instructors address safety measures for doing things around the house.  Discuss how to get up and down off the floor, how to pick things up properly, how to safely get out of a chair without assistance, and balance training.   Meditation and Mindfulness:  -Group instruction provided by verbal instruction, patient participation, and written materials to support subject.  Instructor addresses importance of mindfulness and meditation practice to help reduce stress and improve awareness.  Instructor also leads participants through a meditation exercise.    Stretching for Flexibility and Mobility:  -Group instruction provided by verbal instruction, patient participation, and written materials to support subject.  Instructors lead participants through series of stretches that are designed to increase flexibility thus improving mobility.  These stretches are additional exercise for major muscle groups that are typically performed during regular warm up and cool down.   Hands Only CPR:  -Group verbal, video, and  participation provides a basic overview of AHA guidelines for community CPR. Role-play of emergencies allow participants the opportunity to practice calling for help and chest compression technique with discussion of AED use.   Hypertension: -Group verbal and written instruction that provides a basic overview of hypertension including the most recent diagnostic guidelines, risk factor reduction with self-care instructions and medication management.    Nutrition I class: Heart Healthy Eating:  -Group instruction provided by PowerPoint slides, verbal discussion, and written materials to support subject matter. The instructor gives an explanation and review of the Therapeutic Lifestyle Changes diet recommendations, which includes a discussion on lipid goals, dietary fat, sodium, fiber, plant stanol/sterol esters, sugar, and the components of a well-balanced, healthy diet.   Nutrition II class: Lifestyle Skills:  -Group instruction provided by PowerPoint slides, verbal discussion, and written materials to support subject matter. The instructor gives an explanation and review of label reading, grocery shopping for heart health, heart healthy recipe modifications, and ways to make healthier choices when eating out.   Diabetes Question & Answer:  -Group instruction provided by PowerPoint slides, verbal discussion, and written materials to support subject matter. The instructor gives an explanation and review of diabetes co-morbidities, pre- and post-prandial blood glucose goals, pre-exercise blood glucose goals, signs, symptoms, and treatment of hypoglycemia and hyperglycemia, and foot care basics.   Diabetes Blitz:  -Group instruction provided by PowerPoint slides, verbal discussion, and written materials to support subject matter. The instructor gives an explanation and review of the physiology behind type 1 and type 2 diabetes, diabetes medications and rational behind using different medications,  pre- and post-prandial blood glucose recommendations and Hemoglobin A1c goals, diabetes diet, and exercise including blood  glucose guidelines for exercising safely.    Portion Distortion:  -Group instruction provided by PowerPoint slides, verbal discussion, written materials, and food models to support subject matter. The instructor gives an explanation of serving size versus portion size, changes in portions sizes over the last 20 years, and what consists of a serving from each food group.   Stress Management:  -Group instruction provided by verbal instruction, video, and written materials to support subject matter.  Instructors review role of stress in heart disease and how to cope with stress positively.     Exercising on Your Own:  -Group instruction provided by verbal instruction, power point, and written materials to support subject.  Instructors discuss benefits of exercise, components of exercise, frequency and intensity of exercise, and end points for exercise.  Also discuss use of nitroglycerin and activating EMS.  Review options of places to exercise outside of rehab.  Review guidelines for sex with heart disease.   Cardiac Drugs I:  -Group instruction provided by verbal instruction and written materials to support subject.  Instructor reviews cardiac drug classes: antiplatelets, anticoagulants, beta blockers, and statins.  Instructor discusses reasons, side effects, and lifestyle considerations for each drug class.   Cardiac Drugs II:  -Group instruction provided by verbal instruction and written materials to support subject.  Instructor reviews cardiac drug classes: angiotensin converting enzyme inhibitors (ACE-I), angiotensin II receptor blockers (ARBs), nitrates, and calcium channel blockers.  Instructor discusses reasons, side effects, and lifestyle considerations for each drug class.   Anatomy and Physiology of the Circulatory System:  Group verbal and written instruction and  models provide basic cardiac anatomy and physiology, with the coronary electrical and arterial systems. Review of: AMI, Angina, Valve disease, Heart Failure, Peripheral Artery Disease, Cardiac Arrhythmia, Pacemakers, and the ICD.   Other Education:  -Group or individual verbal, written, or video instructions that support the educational goals of the cardiac rehab program.   Knowledge Questionnaire Score:     Knowledge Questionnaire Score - 02/10/17 1106      Knowledge Questionnaire Score   Pre Score 22/24      Core Components/Risk Factors/Patient Goals at Admission:     Personal Goals and Risk Factors at Admission - 02/10/17 1418      Core Components/Risk Factors/Patient Goals on Admission   Tobacco Cessation Yes   Intervention Assist the participant in steps to quit. Provide individualized education and counseling about committing to Tobacco Cessation, relapse prevention, and pharmacological support that can be provided by physician.;Advice worker, assist with locating and accessing local/national Quit Smoking programs, and support quit date choice.   Expected Outcomes Short Term: Will demonstrate readiness to quit, by selecting a quit date.;Short Term: Will quit all tobacco product use, adhering to prevention of relapse plan.;Long Term: Complete abstinence from all tobacco products for at least 12 months from quit date.      Core Components/Risk Factors/Patient Goals Review:    Core Components/Risk Factors/Patient Goals at Discharge (Final Review):    ITP Comments:     ITP Comments    Row Name 02/10/17 1111           ITP Comments Dr. Fransico Him, Medical Director          Comments:Shelden attended orientation from 0800 to 1000 to review rules and guidelines for program. Completed 6 minute walk test, Intitial ITP, and exercise prescription.  VSS. Telemetry-Sinus Rhythm.  Asymptomatic. Barnet Pall, RN,BSN 02/10/2017 4:20 PM

## 2017-02-10 NOTE — Progress Notes (Signed)
Cardiac Rehab Medication Review by a Pharmacist  Does the patient  feel that his/her medications are working for him/her?  yes  Has the patient been experiencing any side effects to the medications prescribed?  no  Does the patient measure his/her own blood pressure or blood glucose at home?  no ; but going to start - has machine  Does the patient have any problems obtaining medications due to transportation or finances?   no  Understanding of regimen: good Understanding of indications: good Potential of compliance: good   Pharmacist comments: Patient states that he may need financial assistance regarding Brilinta in the future. He was instructed to inform his provider if compliance was going to become an issue in regards to finances.     Bridgett Larsson, PharmD, [facility] PGY1 Pharmacy Resident  02/10/2017 9:05 AM

## 2017-02-14 ENCOUNTER — Encounter: Payer: Self-pay | Admitting: Physician Assistant

## 2017-02-16 ENCOUNTER — Encounter (HOSPITAL_COMMUNITY): Payer: Self-pay

## 2017-02-16 ENCOUNTER — Encounter (HOSPITAL_COMMUNITY)
Admission: RE | Admit: 2017-02-16 | Discharge: 2017-02-16 | Disposition: A | Discharge status: Home / Self Care | Payer: BLUE CROSS/BLUE SHIELD | Source: Ambulatory Visit | Attending: Cardiovascular Disease | Admitting: Cardiovascular Disease

## 2017-02-16 ENCOUNTER — Encounter (HOSPITAL_COMMUNITY): Payer: BLUE CROSS/BLUE SHIELD

## 2017-02-16 DIAGNOSIS — Z955 Presence of coronary angioplasty implant and graft: Secondary | ICD-10-CM | POA: Diagnosis not present

## 2017-02-16 DIAGNOSIS — R748 Abnormal levels of other serum enzymes: Secondary | ICD-10-CM | POA: Diagnosis not present

## 2017-02-16 NOTE — Progress Notes (Signed)
Daily Session Note  Patient Details  Name: Charles Montoya MRN: [id number] Date of Birth: Feb 12, 1971 Referring Provider:     CARDIAC REHAB PHASE II ORIENTATION from 02/10/2017 in [place]  Referring Provider  Grayland Jack MD      Encounter Date: 02/16/2017  Check In:     Session Check In - 02/16/17 1552      Check-In   Location [facility]   Staff Present Cleda Mccreedy, MS, Exercise Physiologist;Amber Fair, MS, ACSM RCEP, Exercise Physiologist;Joann Rion, RN, BSN   Supervising physician immediately available to respond to emergencies [facility] immediately available   Physician(s) Dr. Maryland Pink   Medication changes reported     No   Fall or balance concerns reported    No   Tobacco Cessation No Change   Warm-up and Cool-down Performed as group-led instruction   Resistance Training Performed No   VAD Patient? No     Pain Assessment   Currently in Pain? No/denies   Multiple Pain Sites No      Capillary Blood Glucose: No results found for this or any previous visit (from the past 24 hour(s)).    History  Smoking Status  . Former Smoker  . Packs/day: 1.50  . Types: Cigarettes  . Quit date: 01/19/2017  Smokeless Tobacco  . Never Used    Comment: Using nicoderm gum    Goals Met:  Exercise tolerated well  Goals Unmet:  Not Applicable  Comments: Pt started cardiac rehab today.  Pt tolerated light exercise without difficulty. VSS, telemetry-sinus rhythm, asymptomatic.  Medication list reconciled. Pt denies barriers to medicaiton compliance.  PSYCHOSOCIAL ASSESSMENT:  PHQ-6.  Pt does exhibit health related anxiety.  Pt has many questions and concerns about medications, diet, exercise tolerance.  Pt encouraged to participate in program to gain confidence in symptom and self care management to decrease CAD risk profile and disease progression.   Pt does demonstrate  hopeful outlook with supportive family.   Pt does  have psychologist who manages his anxiety.     pt goals for cardiac rehab are to be able to ride his bicycle from his home to cardiac rehab.Pt oriented to exercise equipment and routine.    Understanding verbalized.   Dr. Fransico Him is Medical Director for Cardiac Rehab at [facility].

## 2017-02-18 ENCOUNTER — Encounter (HOSPITAL_COMMUNITY): Payer: BLUE CROSS/BLUE SHIELD

## 2017-02-18 ENCOUNTER — Encounter (HOSPITAL_COMMUNITY)
Admission: RE | Admit: 2017-02-18 | Discharge: 2017-02-18 | Disposition: A | Discharge status: Home / Self Care | Payer: BLUE CROSS/BLUE SHIELD | Source: Ambulatory Visit | Attending: Cardiovascular Disease | Admitting: Cardiovascular Disease

## 2017-02-18 DIAGNOSIS — Z955 Presence of coronary angioplasty implant and graft: Secondary | ICD-10-CM

## 2017-02-18 DIAGNOSIS — R748 Abnormal levels of other serum enzymes: Secondary | ICD-10-CM | POA: Diagnosis not present

## 2017-02-21 ENCOUNTER — Encounter (HOSPITAL_COMMUNITY): Payer: BLUE CROSS/BLUE SHIELD

## 2017-02-21 ENCOUNTER — Encounter (HOSPITAL_COMMUNITY)
Admission: RE | Admit: 2017-02-21 | Discharge: 2017-02-21 | Disposition: A | Discharge status: Home / Self Care | Payer: BLUE CROSS/BLUE SHIELD | Source: Ambulatory Visit | Attending: Cardiovascular Disease | Admitting: Cardiovascular Disease

## 2017-02-21 DIAGNOSIS — Z955 Presence of coronary angioplasty implant and graft: Secondary | ICD-10-CM

## 2017-02-21 DIAGNOSIS — R748 Abnormal levels of other serum enzymes: Secondary | ICD-10-CM | POA: Diagnosis not present

## 2017-02-23 ENCOUNTER — Other Ambulatory Visit: Payer: BLUE CROSS/BLUE SHIELD | Admitting: *Deleted

## 2017-02-23 ENCOUNTER — Encounter (HOSPITAL_COMMUNITY): Payer: BLUE CROSS/BLUE SHIELD

## 2017-02-23 ENCOUNTER — Telehealth: Payer: Self-pay | Admitting: Cardiovascular Disease

## 2017-02-23 ENCOUNTER — Encounter (HOSPITAL_COMMUNITY)
Admission: RE | Admit: 2017-02-23 | Discharge: 2017-02-23 | Disposition: A | Discharge status: Home / Self Care | Payer: BLUE CROSS/BLUE SHIELD | Source: Ambulatory Visit | Attending: Cardiovascular Disease | Admitting: Cardiovascular Disease

## 2017-02-23 DIAGNOSIS — Z955 Presence of coronary angioplasty implant and graft: Secondary | ICD-10-CM

## 2017-02-23 DIAGNOSIS — E876 Hypokalemia: Secondary | ICD-10-CM

## 2017-02-23 DIAGNOSIS — R748 Abnormal levels of other serum enzymes: Secondary | ICD-10-CM | POA: Insufficient documentation

## 2017-02-23 NOTE — Telephone Encounter (Signed)
Spoke with patient who c/o intermittent fatigue and malaise since hospitalization in late June. He states he thinks it may be r/t to the higher dose of atorvastatin. He denies joint or muscle pain; states his symptoms are more of a generalized fatigue. He states he has no energy to do his ADLs. He sent an email soon after his last ov with V. Bhagat, PA on 7/10 and states he did not heard back. The email was forwarded by a nurse to Tana [place], [place] who stated she thought the patient's symptoms might be r/t nicotine withdrawal. He states he continues to use nicotine gum so he does not think that is the cause. He states he has been on the clomipramine (anti-depressant) for many years and has never had a problem with it. He states he continues to participate in cardiac rehab and was advised by one of the nurses there to take his Brilinta with a caffeinated beverage which he states has helped a little. He states he feels good when he wakes up but a few hours later does not have enough energy to do anything. I reviewed his chart and discovered a K+ level of 3.0 on 6/28. He states he was never advised to start a K+ supplement and there has not been one prescribed. I advised that I will forward message to Dr. Acie Fredrickson for advice and will call him back later today. He verbalized understanding and agreement and thanked me for the call.

## 2017-02-23 NOTE — Telephone Encounter (Signed)
I note that his potassium level was low in June Lets recheck that today .   Its possible that the atorvastatin is causing his muscle weakness. We can hold the Atorvastatin for a week or so and see if that helps with his weakness.

## 2017-02-23 NOTE — Telephone Encounter (Signed)
New Message ° °Pt call requesting to speak with RN. Pt did not want to disclose any further information. Please call back to discuss  °

## 2017-02-23 NOTE — Telephone Encounter (Signed)
Spoke with patient who states he is at [facility] and will come to the office for lab work around 4 pm today. I advised him that once Dr. Acie Fredrickson has the results, we will call him with advice regarding K+ dose. When patient is called with lab results, please advise him that he may hold atorvastatin for 2 weeks per Dr. Acie Fredrickson and call back to report symptoms.

## 2017-02-24 LAB — BASIC METABOLIC PANEL
BUN/Creatinine Ratio: 14 (ref 9–20)
BUN: 16 mg/dL (ref 6–24)
CO2: 23 mmol/L (ref 20–29)
Calcium: 9.7 mg/dL (ref 8.7–10.2)
Chloride: 101 mmol/L (ref 96–106)
Creatinine, Ser: 1.13 mg/dL (ref 0.76–1.27)
GFR calc Af Amer: 91 mL/min/{1.73_m2} (ref >59)
GFR calc non Af Amer: 79 mL/min/{1.73_m2} (ref >59)
GLUCOSE: 84 mg/dL (ref 65–99)
POTASSIUM: 4.3 mmol/L (ref 3.5–5.2)
SODIUM: 140 mmol/L (ref 134–144)

## 2017-02-24 NOTE — Telephone Encounter (Signed)
Phone call to [name],  His potassium is 4.3 - this is not the cause of his fatigue and malaise. He thinks the fatigue became much more pronounced when he increased his dose of Atorvastatin  Will have him hold Atorvastatin for now If his fatigue improves, will need to consider an alternate therapy - ? crestor or perhaps in injectable PSK9 inhibitor ( pralulent or Repatha)  He will call next week and let us know how he is doing     Mertie Moores, MD  02/24/2017 9:51 AM    [place] [address],  [place], [place]  [address] Pager [phone] Phone: [phone]; Fax: [phone]

## 2017-02-25 ENCOUNTER — Encounter (HOSPITAL_COMMUNITY)
Admission: RE | Admit: 2017-02-25 | Discharge: 2017-02-25 | Disposition: A | Discharge status: Home / Self Care | Payer: BLUE CROSS/BLUE SHIELD | Source: Ambulatory Visit | Attending: Cardiovascular Disease | Admitting: Cardiovascular Disease

## 2017-02-25 ENCOUNTER — Encounter (HOSPITAL_COMMUNITY): Payer: BLUE CROSS/BLUE SHIELD

## 2017-02-25 DIAGNOSIS — R748 Abnormal levels of other serum enzymes: Secondary | ICD-10-CM | POA: Diagnosis not present

## 2017-02-25 DIAGNOSIS — Z955 Presence of coronary angioplasty implant and graft: Secondary | ICD-10-CM

## 2017-02-25 NOTE — Progress Notes (Signed)
QUALITY OF LIFE SCORE REVIEW  Pt completed Quality of Life survey as a participant in Cardiac Rehab. Scores 21.0 or below are considered low. Pt score very low in several areas Overall 20.98, Health and Function 21.2, socioeconomic 21.36 , physiological and spiritual 20.57, family 73.Marland Kitchen Patient quality of life slightly altered by physical constraints which limits ability to perform as prior to recent cardiac illness. Charles Montoya emotional support and reassurance.  Will continue to monitor and intervene as necessary.  Charles Montoya quit smoking and has changed his diet. Charles Montoya talked to Charles Montoya about changing his diet to a plant based diet. Charles Montoya denies being depressed.Will continue to monitor the patient throughout  the program.Charles Montoya Charles Maxon, RN,BSN 02/28/2017 4:52 PM

## 2017-02-25 NOTE — Progress Notes (Signed)
Nutrition Note Spoke with pt. Pt wants to go vegetarian. Pt has been researching Dr. Coletta Memos diet and Dr. Maurine Minister diet. Transitioning to a vegetarian diet discussed. Pt is motivated to make lifestyle changes. Discussion re: making a slow transition to vegetarian diet given multitude of lifestyle changes occurring at this time (e.g. Tobacco cessation). Pt given a handout re: vegetarian resources. Continue client-centered nutrition education by RD as part of interdisciplinary care.  Monitor and evaluate progress toward nutrition goal with team.  Derek Mound, M.Ed, RD, LDN, CDE 02/25/2017 3:53 PM

## 2017-02-28 ENCOUNTER — Encounter (HOSPITAL_COMMUNITY)
Admission: RE | Admit: 2017-02-28 | Discharge: 2017-02-28 | Disposition: A | Discharge status: Home / Self Care | Payer: BLUE CROSS/BLUE SHIELD | Source: Ambulatory Visit | Attending: Cardiovascular Disease | Admitting: Cardiovascular Disease

## 2017-02-28 ENCOUNTER — Encounter (HOSPITAL_COMMUNITY): Payer: BLUE CROSS/BLUE SHIELD

## 2017-02-28 DIAGNOSIS — Z955 Presence of coronary angioplasty implant and graft: Secondary | ICD-10-CM | POA: Diagnosis not present

## 2017-02-28 DIAGNOSIS — R748 Abnormal levels of other serum enzymes: Secondary | ICD-10-CM | POA: Diagnosis not present

## 2017-03-01 ENCOUNTER — Other Ambulatory Visit: Payer: BLUE CROSS/BLUE SHIELD | Admitting: *Deleted

## 2017-03-01 ENCOUNTER — Telehealth: Payer: Self-pay | Admitting: Cardiovascular Disease

## 2017-03-01 DIAGNOSIS — I25110 Atherosclerotic heart disease of native coronary artery with unstable angina pectoris: Secondary | ICD-10-CM | POA: Diagnosis not present

## 2017-03-01 LAB — LIPID PANEL
CHOL/HDL RATIO: 4.7 ratio (ref 0.0–5.0)
Cholesterol, Total: 165 mg/dL (ref 100–199)
HDL: 35 mg/dL — ABNORMAL LOW (ref >39)
LDL CALC: 95 mg/dL (ref 0–99)
TRIGLYCERIDES: 177 mg/dL — AB (ref 0–149)
VLDL Cholesterol Cal: 35 mg/dL (ref 5–40)

## 2017-03-01 LAB — HEPATIC FUNCTION PANEL
ALBUMIN: 4.8 g/dL (ref 3.5–5.5)
ALT: 21 IU/L (ref 0–44)
AST: 18 IU/L (ref 0–40)
Alkaline Phosphatase: 77 IU/L (ref 39–117)
BILIRUBIN TOTAL: 0.4 mg/dL (ref 0.0–1.2)
BILIRUBIN, DIRECT: 0.12 mg/dL (ref 0.00–0.40)
TOTAL PROTEIN: 6.6 g/dL (ref 6.0–8.5)

## 2017-03-01 NOTE — Telephone Encounter (Signed)
Patient would like to speak to you regarding his Lipitor and if he should continue taking it.

## 2017-03-02 ENCOUNTER — Encounter (HOSPITAL_COMMUNITY)
Admission: RE | Admit: 2017-03-02 | Discharge: 2017-03-02 | Disposition: A | Discharge status: Home / Self Care | Payer: BLUE CROSS/BLUE SHIELD | Source: Ambulatory Visit | Attending: Cardiovascular Disease | Admitting: Cardiovascular Disease

## 2017-03-02 ENCOUNTER — Encounter (HOSPITAL_COMMUNITY): Payer: BLUE CROSS/BLUE SHIELD

## 2017-03-02 DIAGNOSIS — Z955 Presence of coronary angioplasty implant and graft: Secondary | ICD-10-CM | POA: Diagnosis not present

## 2017-03-02 DIAGNOSIS — R748 Abnormal levels of other serum enzymes: Secondary | ICD-10-CM | POA: Diagnosis not present

## 2017-03-02 MED ORDER — ATORVASTATIN CALCIUM 20 MG PO TABS: 20.0000 mg | ORAL_TABLET | Freq: Every day | ORAL | 3 refills | Status: DC

## 2017-03-02 NOTE — Telephone Encounter (Signed)
Spoke with patient who states he has reviewed his lab work online and would like to know what our thoughts are. I advised that the improvements in LDL and triglyceride levels are encouraging. I asked how he is feeling since holding the Lipitor and he states he feels a little better but thinks his symptoms may be r/t depression. He asks if he can decrease Lipitor to 1/2 tab (20 mg) daily and I confirmed. He is due for f/u with Dr. Acie Fredrickson in September and we will plan to recheck at that visit. I advised him to call back prior to that appointment with questions or concerns. He verbalized understanding and agreement and thanked me for the call.

## 2017-03-02 NOTE — Addendum Note (Signed)
Addended by: Emmaline Life on: 03/02/2017 11:20 AM   Modules accepted: Orders

## 2017-03-04 ENCOUNTER — Encounter (HOSPITAL_COMMUNITY): Payer: BLUE CROSS/BLUE SHIELD

## 2017-03-04 ENCOUNTER — Encounter (HOSPITAL_COMMUNITY)
Admission: RE | Admit: 2017-03-04 | Discharge: 2017-03-04 | Disposition: A | Discharge status: Home / Self Care | Payer: BLUE CROSS/BLUE SHIELD | Source: Ambulatory Visit | Attending: Cardiovascular Disease | Admitting: Cardiovascular Disease

## 2017-03-04 DIAGNOSIS — Z955 Presence of coronary angioplasty implant and graft: Secondary | ICD-10-CM | POA: Diagnosis not present

## 2017-03-04 DIAGNOSIS — R748 Abnormal levels of other serum enzymes: Secondary | ICD-10-CM | POA: Diagnosis not present

## 2017-03-07 ENCOUNTER — Encounter (HOSPITAL_COMMUNITY): Payer: BLUE CROSS/BLUE SHIELD

## 2017-03-07 ENCOUNTER — Encounter (HOSPITAL_COMMUNITY)
Admission: RE | Admit: 2017-03-07 | Discharge: 2017-03-07 | Disposition: A | Discharge status: Home / Self Care | Payer: BLUE CROSS/BLUE SHIELD | Source: Ambulatory Visit | Attending: Cardiovascular Disease | Admitting: Cardiovascular Disease

## 2017-03-07 DIAGNOSIS — R748 Abnormal levels of other serum enzymes: Secondary | ICD-10-CM | POA: Diagnosis not present

## 2017-03-07 DIAGNOSIS — Z955 Presence of coronary angioplasty implant and graft: Secondary | ICD-10-CM | POA: Diagnosis not present

## 2017-03-09 ENCOUNTER — Encounter (HOSPITAL_COMMUNITY): Payer: BLUE CROSS/BLUE SHIELD

## 2017-03-09 ENCOUNTER — Encounter (HOSPITAL_COMMUNITY)
Admission: RE | Admit: 2017-03-09 | Discharge: 2017-03-09 | Disposition: A | Discharge status: Home / Self Care | Payer: BLUE CROSS/BLUE SHIELD | Source: Ambulatory Visit | Attending: Cardiovascular Disease | Admitting: Cardiovascular Disease

## 2017-03-09 DIAGNOSIS — Z955 Presence of coronary angioplasty implant and graft: Secondary | ICD-10-CM

## 2017-03-09 DIAGNOSIS — R748 Abnormal levels of other serum enzymes: Secondary | ICD-10-CM | POA: Diagnosis not present

## 2017-03-10 NOTE — Progress Notes (Signed)
Cardiac Individual Treatment Plan  Patient Details  Name: Charles Montoya MRN: [id number] Date of Birth: 1970/12/03 Referring Provider:     CARDIAC REHAB PHASE II ORIENTATION from 02/10/2017 in [facility]  Referring Provider  Grayland Jack MD      Initial Encounter Date:    CARDIAC REHAB PHASE II ORIENTATION from 02/10/2017 in [facility]  Date  02/10/17  Referring Provider  Nahser, Doren Custard MD      Visit Diagnosis: S/P drug eluting coronary stent placement 01/19/2017  Patient's Home Medications on Admission:  Current Outpatient Prescriptions:  .  ALPRAZolam (XANAX) 0.5 MG tablet, Take 0.5 mg by mouth 3 (three) times daily as needed for anxiety. Anxiety , Disp: , Rfl:  .  aspirin 81 MG chewable tablet, Chew 1 tablet (81 mg total) by mouth daily., Disp: 30 tablet, Rfl: 11 .  atorvastatin (LIPITOR) 20 MG tablet, Take 1 tablet (20 mg total) by mouth daily., Disp: 90 tablet, Rfl: 3 .  clomiPRAMINE (ANAFRANIL) 25 MG capsule, Take 25 mg by mouth at bedtime., Disp: , Rfl:  .  nicotine polacrilex (NICORETTE) 4 MG gum, Take 4 mg by mouth as needed for smoking cessation., Disp: , Rfl:  .  nitroGLYCERIN (NITROSTAT) 0.4 MG SL tablet, Place 1 tablet (0.4 mg total) under the tongue every 5 (five) minutes as needed for chest pain., Disp: 25 tablet, Rfl: 3 .  omeprazole (PRILOSEC) 40 MG capsule, Take 40 mg by mouth as needed (HEARTBURN/ UPSET STOMACH). , Disp: , Rfl:  .  ticagrelor (BRILINTA) 90 MG TABS tablet, Take 1 tablet (90 mg total) by mouth 2 (two) times daily., Disp: 60 tablet, Rfl: 11  Past Medical History: Past Medical History:  Diagnosis Date  . Anxiety   . CAD (coronary artery disease)    a. cath 12/2016--> s/p DES to RPDA, 50% dRCA, 25% pLAD, 40% mLAD, 25% pro to mid CX  . Tobacco abuse     Tobacco Use: History  Smoking Status  . Former Smoker  . Packs/day: 1.50  . Types: Cigarettes  . Quit date: 01/19/2017   Smokeless Tobacco  . Never Used    Comment: Using nicoderm gum    Labs: Recent Review Flowsheet Data    Labs for ITP Cardiac and Pulmonary Rehab Latest Ref Rng & Units 01/19/2017 03/01/2017   Cholestrol 100 - 199 mg/dL 219(H) 165   LDLCALC 0 - 99 mg/dL 138(H) 95   HDL >39 mg/dL 33(L) 35(L)   Trlycerides 0 - 149 mg/dL 241(H) 177(H)      Capillary Blood Glucose: No results found for: GLUCAP   Exercise Target Goals:    Exercise Program Goal: Individual exercise prescription set with THRR, safety & activity barriers. Participant demonstrates ability to understand and report RPE using BORG scale, to self-measure pulse accurately, and to acknowledge the importance of the exercise prescription.  Exercise Prescription Goal: Starting with aerobic activity 30 plus minutes a day, 3 days per week for initial exercise prescription. Provide home exercise prescription and guidelines that participant acknowledges understanding prior to discharge.  Activity Barriers & Risk Stratification:     Activity Barriers & Cardiac Risk Stratification - 02/10/17 1427      Activity Barriers & Cardiac Risk Stratification   Activity Barriers Other (comment)   Comments Occasional hip pain   Cardiac Risk Stratification Moderate      6 Minute Walk:     6 Minute Walk    Row Name 02/10/17 1112  6 Minute Walk   Phase Initial     Distance 1636 feet     Walk Time 6 minutes     # of Rest Breaks 0     MPH 3.1     METS 5     RPE 11     VO2 Peak 17.3     Symptoms No     Resting HR 60 bpm     Resting BP 122/82     Max Ex. HR 87 bpm     Max Ex. BP 128/86     2 Minute Post BP 124/82        Oxygen Initial Assessment:   Oxygen Re-Evaluation:   Oxygen Discharge (Final Oxygen Re-Evaluation):   Initial Exercise Prescription:     Initial Exercise Prescription - 02/10/17 1100      Date of Initial Exercise RX and Referring Provider   Date 02/10/17   Referring Provider Nahser, Doren Custard  MD     Treadmill   MPH 3.2   Grade 1   Minutes 10   METs 3.4     Bike   Level 1   Minutes 10   METs 3.2     NuStep   Level 3   SPM 85   Minutes 10   METs 3     Prescription Details   Frequency (times per week) 3   Duration Progress to 45 minutes of aerobic exercise without signs/symptoms of physical distress     Intensity   THRR 40-80% of Max Heartrate 70-141   Ratings of Perceived Exertion 11-13   Perceived Dyspnea 0-4     Progression   Progression Continue to progress workloads to maintain intensity without signs/symptoms of physical distress.     Resistance Training   Training Prescription Yes   Weight 3   Reps 10-15      Perform Capillary Blood Glucose checks as needed.  Exercise Prescription Changes:     Exercise Prescription Changes    Row Name 03/04/17 1400             Response to Exercise   Blood Pressure (Admit) 138/88       Blood Pressure (Exercise) 134/82       Blood Pressure (Exit) 128/84       Heart Rate (Admit) 78 bpm       Heart Rate (Exercise) 106 bpm       Heart Rate (Exit) 87 bpm       Rating of Perceived Exertion (Exercise) 12       Duration Progress to 45 minutes of aerobic exercise without signs/symptoms of physical distress       Intensity THRR unchanged         Progression   Progression Continue to progress workloads to maintain intensity without signs/symptoms of physical distress.       Average METs 3.6         Resistance Training   Training Prescription Yes       Weight 4lb       Reps 10-15         Treadmill   MPH 3.2       Grade 1       Minutes 10       METs 3.9         Bike   Level 1       Minutes 10       METs 3.25         NuStep  Level 4       SPM 85       Minutes 10       METs 3.6          Exercise Comments:     Exercise Comments    Row Name 03/04/17 1429           Exercise Comments Reviewed goals and home exercise with pt.           Exercise Goals and Review:     Exercise Goals     Row Name 02/10/17 1120             Exercise Goals   Increase Physical Activity Yes       Intervention Provide advice, education, support and counseling about physical activity/exercise needs.;Develop an individualized exercise prescription for aerobic and resistive training based on initial evaluation findings, risk stratification, comorbidities and participant's personal goals.       Expected Outcomes Achievement of increased cardiorespiratory fitness and enhanced flexibility, muscular endurance and strength shown through measurements of functional capacity and personal statement of participant.       Increase Strength and Stamina Yes       Intervention Provide advice, education, support and counseling about physical activity/exercise needs.;Develop an individualized exercise prescription for aerobic and resistive training based on initial evaluation findings, risk stratification, comorbidities and participant's personal goals.       Expected Outcomes Achievement of increased cardiorespiratory fitness and enhanced flexibility, muscular endurance and strength shown through measurements of functional capacity and personal statement of participant.          Exercise Goals Re-Evaluation :     Exercise Goals Re-Evaluation    Row Name 03/04/17 1427             Exercise Goal Re-Evaluation   Exercise Goals Review Increase Physical Activity;Increase Strenth and Stamina       Comments Pt states he is getting comfortable with exercise and tolerating increases well. He says he is building his own bike and plans to start riding it next week and he is doing some walking at home       Expected Outcomes continue with exercise Rx, home exercise program and gradually incresae workloads as tolerated in order to increase cardiorespiratory fitness.            Discharge Exercise Prescription (Final Exercise Prescription Changes):     Exercise Prescription Changes - 03/04/17 1400      Response to  Exercise   Blood Pressure (Admit) 138/88   Blood Pressure (Exercise) 134/82   Blood Pressure (Exit) 128/84   Heart Rate (Admit) 78 bpm   Heart Rate (Exercise) 106 bpm   Heart Rate (Exit) 87 bpm   Rating of Perceived Exertion (Exercise) 12   Duration Progress to 45 minutes of aerobic exercise without signs/symptoms of physical distress   Intensity THRR unchanged     Progression   Progression Continue to progress workloads to maintain intensity without signs/symptoms of physical distress.   Average METs 3.6     Resistance Training   Training Prescription Yes   Weight 4lb   Reps 10-15     Treadmill   MPH 3.2   Grade 1   Minutes 10   METs 3.9     Bike   Level 1   Minutes 10   METs 3.25     NuStep   Level 4   SPM 85   Minutes 10   METs 3.6  Nutrition:  Target Goals: Understanding of nutrition guidelines, daily intake of sodium 1500mg , cholesterol 200mg , calories 30% from fat and 7% or less from saturated fats, daily to have 5 or more servings of fruits and vegetables.  Biometrics:     Pre Biometrics - 02/10/17 1423      Pre Biometrics   Height 5' 10.5" (1.791 m)   Weight 179 lb 3.7 oz (81.3 kg)   Waist Circumference 35 inches   Hip Circumference 40 inches   Waist to Hip Ratio 0.88 %   BMI (Calculated) 25.4   Triceps Skinfold 13 mm   % Body Fat 22.7 %   Grip Strength 44 kg   Flexibility 13 in   Single Leg Stand 30 seconds       Nutrition Therapy Plan and Nutrition Goals:     Nutrition Therapy & Goals - 02/10/17 1417      Nutrition Therapy   Diet Therapeutic Lifestyle Changes     Personal Nutrition Goals   Nutrition Goal Wt loss of 1-2 lb/week to a wt loss goal of 6-10 lb at graduation from [place], educate and counsel regarding individualized specific dietary modifications aiming towards targeted core components such as weight, hypertension, lipid management, diabetes, heart failure and  other comorbidities.   Expected Outcomes Short Term Goal: Understand basic principles of dietary content, such as calories, fat, sodium, cholesterol and nutrients.;Long Term Goal: Adherence to prescribed nutrition plan.      Nutrition Discharge: Nutrition Scores:     Nutrition Assessments - 02/10/17 1452      MEDFICTS Scores   Pre Score 6      Nutrition Goals Re-Evaluation:   Nutrition Goals Re-Evaluation:   Nutrition Goals Discharge (Final Nutrition Goals Re-Evaluation):   Psychosocial: Target Goals: Acknowledge presence or absence of significant depression and/or stress, maximize coping skills, provide positive support system. Participant is able to verbalize types and ability to use techniques and skills needed for reducing stress and depression.  Initial Review & Psychosocial Screening:     Initial Psych Review & Screening - 02/10/17 1422      Initial Review   Current issues with None Identified     Family Dynamics   Good Support System? Yes     Barriers   Psychosocial barriers to participate in program There are no identifiable barriers or psychosocial needs.      Quality of Life Scores:     Quality of Life - 02/10/17 1121      Quality of Life Scores   Health/Function Pre 21.2 %   Socioeconomic Pre 21.36 %   Psych/Spiritual Pre 20.57 %   Family Pre 20 %   GLOBAL Pre 20.98 %      PHQ-9: Recent Review Flowsheet Data    Depression screen [facility] 2/9 02/16/2017   Decreased Interest 1   Down, Depressed, Hopeless 1   PHQ - 2 Score 2   Altered sleeping 1   Tired, decreased energy 1   Change in appetite 1   Feeling bad or failure about yourself  1   Trouble concentrating 0   Moving slowly or fidgety/restless 0   Suicidal thoughts 0   PHQ-9 Score 6   Difficult doing work/chores Somewhat difficult     Interpretation of Total Score  Total Score Depression Severity:  1-4 = Minimal depression, 5-9 = Mild depression, 10-14 = Moderate depression, 15-19 =  Moderately severe depression, 20-27 = Severe depression  Psychosocial Evaluation and Intervention:     Psychosocial Evaluation - 02/16/17 1717      Psychosocial Evaluation & Interventions   Interventions Encouraged to exercise with the program and follow exercise prescription;Relaxation education;Stress management education   Comments pt exhibits health related anxiety symptoms.  pt encouraged to participate in program to reduce these symptoms. pt does have psychologist who he  has discussed his emotional concerns with.    Expected Outcomes pt will demonstrate positive outlook with good coping skills.    Continue Psychosocial Services  Follow up required by staff      Psychosocial Re-Evaluation:     Psychosocial Re-Evaluation    [place] Name 03/10/17 1025             Psychosocial Re-Evaluation   Current issues with None Identified       Interventions Encouraged to attend Cardiac Rehabilitation for the exercise       Continue Psychosocial Services  No Follow up required          Psychosocial Discharge (Final Psychosocial Re-Evaluation):     Psychosocial Re-Evaluation - 03/10/17 1025      Psychosocial Re-Evaluation   Current issues with None Identified   Interventions Encouraged to attend Cardiac Rehabilitation for the exercise   Continue Psychosocial Services  No Follow up required      Vocational Rehabilitation: Provide vocational rehab assistance to qualifying candidates.   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      Education: Education Goals: Education classes will be provided on a weekly basis, covering required topics. Participant will state understanding/return demonstration of topics  presented.  Learning Barriers/Preferences:     Learning Barriers/Preferences - 02/10/17 1420      Learning Barriers/Preferences   Learning Barriers None   Learning Preferences None      Education Topics: Count Your Pulse:  -Group instruction provided by verbal instruction, demonstration, patient participation and written materials to support subject.  Instructors address importance of being able to find your pulse and how to count your pulse when at home without a heart monitor.  Patients get hands on experience counting their pulse with staff help and individually.   Heart Attack, Angina, and Risk Factor Modification:  -Group instruction provided by verbal instruction, video, and written materials to support subject.  Instructors address signs and symptoms of angina and heart attacks.    Also discuss risk factors for heart disease and how to make changes to improve heart health risk factors.   CARDIAC REHAB PHASE II EXERCISE from 03/04/2017 in [facility]  Date  02/16/17  Instruction Review Code  2- meets goals/outcomes      Functional Fitness:  -Group instruction provided by verbal instruction, demonstration, patient participation, and written materials to support subject.  Instructors address safety measures for doing things around the house.  Discuss how to get up and down off the floor, how to pick things up properly, how to safely get out of a chair without assistance, and balance training.   Meditation and Mindfulness:  -Group instruction provided by verbal instruction, patient participation, and written materials to support subject.  Instructor addresses importance of mindfulness and meditation practice to help reduce stress and improve awareness.  Instructor also  leads participants through a meditation exercise.    Stretching for Flexibility and Mobility:  -Group instruction provided by verbal instruction, patient participation, and written  materials to support subject.  Instructors lead participants through series of stretches that are designed to increase flexibility thus improving mobility.  These stretches are additional exercise for major muscle groups that are typically performed during regular warm up and cool down.   CARDIAC REHAB PHASE II EXERCISE from 03/04/2017 in [facility]  Date  02/18/17  Instruction Review Code  2- meets goals/outcomes      Hands Only CPR:  -Group verbal, video, and participation provides a basic overview of AHA guidelines for community CPR. Role-play of emergencies allow participants the opportunity to practice calling for help and chest compression technique with discussion of AED use.   Hypertension: -Group verbal and written instruction that provides a basic overview of hypertension including the most recent diagnostic guidelines, risk factor reduction with self-care instructions and medication management.   CARDIAC REHAB PHASE II EXERCISE from 03/04/2017 in [place]  Date  03/04/17  Instruction Review Code  2- meets goals/outcomes       Nutrition I class: Heart Healthy Eating:  -Group instruction provided by PowerPoint slides, verbal discussion, and written materials to support subject matter. The instructor gives an explanation and review of the Therapeutic Lifestyle Changes diet recommendations, which includes a discussion on lipid goals, dietary fat, sodium, fiber, plant stanol/sterol esters, sugar, and the components of a well-balanced, healthy diet.   Nutrition II class: Lifestyle Skills:  -Group instruction provided by PowerPoint slides, verbal discussion, and written materials to support subject matter. The instructor gives an explanation and review of label reading, grocery shopping for heart health, heart healthy recipe modifications, and ways to make healthier choices when eating out.   Diabetes Question & Answer:   -Group instruction provided by PowerPoint slides, verbal discussion, and written materials to support subject matter. The instructor gives an explanation and review of diabetes co-morbidities, pre- and post-prandial blood glucose goals, pre-exercise blood glucose goals, signs, symptoms, and treatment of hypoglycemia and hyperglycemia, and foot care basics.   Diabetes Blitz:  -Group instruction provided by PowerPoint slides, verbal discussion, and written materials to support subject matter. The instructor gives an explanation and review of the physiology behind type 1 and type 2 diabetes, diabetes medications and rational behind using different medications, pre- and post-prandial blood glucose recommendations and Hemoglobin A1c goals, diabetes diet, and exercise including blood glucose guidelines for exercising safely.    Portion Distortion:  -Group instruction provided by PowerPoint slides, verbal discussion, written materials, and food models to support subject matter. The instructor gives an explanation of serving size versus portion size, changes in portions sizes over the last 20 years, and what consists of a serving from each food group.   Stress Management:  -Group instruction provided by verbal instruction, video, and written materials to support subject matter.  Instructors review role of stress in heart disease and how to cope with stress positively.     Exercising on Your Own:  -Group instruction provided by verbal instruction, power point, and written materials to support subject.  Instructors discuss benefits of exercise, components of exercise, frequency and intensity of exercise, and end points for exercise.  Also discuss use of nitroglycerin and activating EMS.  Review options of places to exercise outside of rehab.  Review guidelines for sex with heart disease.   Cardiac Drugs I:  -Group  instruction provided by verbal instruction and written materials to support subject.   Instructor reviews cardiac drug classes: antiplatelets, anticoagulants, beta blockers, and statins.  Instructor discusses reasons, side effects, and lifestyle considerations for each drug class.   CARDIAC REHAB PHASE II EXERCISE from 03/02/2017 in [place]  Date  03/02/17  Instruction Review Code  2- meets goals/outcomes      Cardiac Drugs II:  -Group instruction provided by verbal instruction and written materials to support subject.  Instructor reviews cardiac drug classes: angiotensin converting enzyme inhibitors (ACE-I), angiotensin II receptor blockers (ARBs), nitrates, and calcium channel blockers.  Instructor discusses reasons, side effects, and lifestyle considerations for each drug class.   Anatomy and Physiology of the Circulatory System:  Group verbal and written instruction and models provide basic cardiac anatomy and physiology, with the coronary electrical and arterial systems. Review of: AMI, Angina, Valve disease, Heart Failure, Peripheral Artery Disease, Cardiac Arrhythmia, Pacemakers, and the ICD.   Other Education:  -Group or individual verbal, written, or video instructions that support the educational goals of the cardiac rehab program.   Knowledge Questionnaire Score:     Knowledge Questionnaire Score - 02/10/17 1106      Knowledge Questionnaire Score   Pre Score 22/24      Core Components/Risk Factors/Patient Goals at Admission:     Personal Goals and Risk Factors at Admission - 02/10/17 1418      Core Components/Risk Factors/Patient Goals on Admission   Tobacco Cessation Yes   Intervention Assist the participant in steps to quit. Provide individualized education and counseling about committing to Tobacco Cessation, relapse prevention, and pharmacological support that can be provided by physician.;Advice worker, assist with locating and accessing local/national Quit Smoking programs, and support quit date  choice.   Expected Outcomes Short Term: Will demonstrate readiness to quit, by selecting a quit date.;Short Term: Will quit all tobacco product use, adhering to prevention of relapse plan.;Long Term: Complete abstinence from all tobacco products for at least 12 months from quit date.      Core Components/Risk Factors/Patient Goals Review:    Core Components/Risk Factors/Patient Goals at Discharge (Final Review):    ITP Comments:     ITP Comments    Row Name 02/10/17 1111           ITP Comments Dr. Fransico Him, Medical Director          Comments: Charles Montoya is making expected progress toward personal goals after completing 11 sessions. Recommend continued exercise and life style modification education including  stress management and relaxation techniques to decrease cardiac risk profile. Charles Montoya continues to do well with diet exercise and smoking cessation.Barnet Pall, RN,BSN 03/10/2017 10:31 AM

## 2017-03-11 ENCOUNTER — Encounter (HOSPITAL_COMMUNITY)
Admission: RE | Admit: 2017-03-11 | Discharge: 2017-03-11 | Disposition: A | Discharge status: Home / Self Care | Payer: BLUE CROSS/BLUE SHIELD | Source: Ambulatory Visit | Attending: Cardiovascular Disease | Admitting: Cardiovascular Disease

## 2017-03-11 ENCOUNTER — Encounter (HOSPITAL_COMMUNITY): Payer: BLUE CROSS/BLUE SHIELD

## 2017-03-11 DIAGNOSIS — Z955 Presence of coronary angioplasty implant and graft: Secondary | ICD-10-CM | POA: Diagnosis not present

## 2017-03-11 DIAGNOSIS — R748 Abnormal levels of other serum enzymes: Secondary | ICD-10-CM | POA: Diagnosis not present

## 2017-03-14 ENCOUNTER — Encounter (HOSPITAL_COMMUNITY)
Admission: RE | Admit: 2017-03-14 | Discharge: 2017-03-14 | Disposition: A | Discharge status: Home / Self Care | Payer: BLUE CROSS/BLUE SHIELD | Source: Ambulatory Visit | Attending: Cardiovascular Disease | Admitting: Cardiovascular Disease

## 2017-03-14 ENCOUNTER — Encounter (HOSPITAL_COMMUNITY): Payer: BLUE CROSS/BLUE SHIELD

## 2017-03-14 DIAGNOSIS — Z955 Presence of coronary angioplasty implant and graft: Secondary | ICD-10-CM

## 2017-03-14 DIAGNOSIS — R748 Abnormal levels of other serum enzymes: Secondary | ICD-10-CM | POA: Diagnosis not present

## 2017-03-16 ENCOUNTER — Encounter (HOSPITAL_COMMUNITY): Payer: BLUE CROSS/BLUE SHIELD

## 2017-03-16 ENCOUNTER — Encounter (HOSPITAL_COMMUNITY)
Admission: RE | Admit: 2017-03-16 | Discharge: 2017-03-16 | Disposition: A | Discharge status: Home / Self Care | Payer: BLUE CROSS/BLUE SHIELD | Source: Ambulatory Visit | Attending: Cardiovascular Disease | Admitting: Cardiovascular Disease

## 2017-03-16 DIAGNOSIS — R748 Abnormal levels of other serum enzymes: Secondary | ICD-10-CM | POA: Diagnosis not present

## 2017-03-16 DIAGNOSIS — Z955 Presence of coronary angioplasty implant and graft: Secondary | ICD-10-CM

## 2017-03-16 NOTE — Progress Notes (Signed)
Sylvia Kondracki Deloria 46 y.o. male   Nutrition Note Spoke with pt. Pt had questions re: lipid panel. Lipid panel explained. Pt reports recent c/o fatigue with statin. Pt stopped statin medication for a short while and is now taking 20 mg every other day. Pt reports he "doesn't feel great but I don't feel bad either." Dietary modifications to help manage lipids discussed. Pt expressed understanding via feedback method. Continue client-centered nutrition education by RD as part of interdisciplinary care.  Monitor and evaluate progress toward nutrition goal with team.  Derek Mound, M.Ed, RD, LDN, CDE 03/16/2017 4:13 PM

## 2017-03-18 ENCOUNTER — Encounter (HOSPITAL_COMMUNITY)
Admission: RE | Admit: 2017-03-18 | Discharge: 2017-03-18 | Disposition: A | Discharge status: Home / Self Care | Payer: BLUE CROSS/BLUE SHIELD | Source: Ambulatory Visit | Attending: Cardiovascular Disease | Admitting: Cardiovascular Disease

## 2017-03-18 ENCOUNTER — Encounter (HOSPITAL_COMMUNITY): Payer: BLUE CROSS/BLUE SHIELD

## 2017-03-18 DIAGNOSIS — Z955 Presence of coronary angioplasty implant and graft: Secondary | ICD-10-CM

## 2017-03-18 DIAGNOSIS — R748 Abnormal levels of other serum enzymes: Secondary | ICD-10-CM | POA: Diagnosis not present

## 2017-03-21 ENCOUNTER — Encounter (HOSPITAL_COMMUNITY)
Admission: RE | Admit: 2017-03-21 | Discharge: 2017-03-21 | Disposition: A | Discharge status: Home / Self Care | Payer: BLUE CROSS/BLUE SHIELD | Source: Ambulatory Visit | Attending: Cardiovascular Disease | Admitting: Cardiovascular Disease

## 2017-03-21 ENCOUNTER — Encounter (HOSPITAL_COMMUNITY): Payer: BLUE CROSS/BLUE SHIELD

## 2017-03-21 DIAGNOSIS — Z955 Presence of coronary angioplasty implant and graft: Secondary | ICD-10-CM | POA: Diagnosis not present

## 2017-03-21 DIAGNOSIS — R748 Abnormal levels of other serum enzymes: Secondary | ICD-10-CM | POA: Diagnosis not present

## 2017-03-22 DIAGNOSIS — S91012A Laceration without foreign body, left ankle, initial encounter: Secondary | ICD-10-CM | POA: Diagnosis not present

## 2017-03-22 DIAGNOSIS — B353 Tinea pedis: Secondary | ICD-10-CM | POA: Diagnosis not present

## 2017-03-23 ENCOUNTER — Encounter (HOSPITAL_COMMUNITY)
Admission: RE | Admit: 2017-03-23 | Discharge: 2017-03-23 | Disposition: A | Discharge status: Home / Self Care | Payer: BLUE CROSS/BLUE SHIELD | Source: Ambulatory Visit | Attending: Cardiovascular Disease | Admitting: Cardiovascular Disease

## 2017-03-23 ENCOUNTER — Encounter: Payer: Self-pay | Admitting: Cardiovascular Disease

## 2017-03-23 ENCOUNTER — Encounter (HOSPITAL_COMMUNITY): Payer: BLUE CROSS/BLUE SHIELD

## 2017-03-23 DIAGNOSIS — Z955 Presence of coronary angioplasty implant and graft: Secondary | ICD-10-CM | POA: Diagnosis not present

## 2017-03-23 DIAGNOSIS — R748 Abnormal levels of other serum enzymes: Secondary | ICD-10-CM | POA: Diagnosis not present

## 2017-03-25 ENCOUNTER — Encounter (HOSPITAL_COMMUNITY): Payer: BLUE CROSS/BLUE SHIELD

## 2017-03-25 ENCOUNTER — Encounter (HOSPITAL_COMMUNITY)
Admission: RE | Admit: 2017-03-25 | Discharge: 2017-03-25 | Disposition: A | Discharge status: Home / Self Care | Payer: BLUE CROSS/BLUE SHIELD | Source: Ambulatory Visit | Attending: Cardiovascular Disease | Admitting: Cardiovascular Disease

## 2017-03-25 ENCOUNTER — Other Ambulatory Visit: Payer: Self-pay | Admitting: Nurse Practitioner

## 2017-03-25 DIAGNOSIS — R748 Abnormal levels of other serum enzymes: Secondary | ICD-10-CM | POA: Diagnosis not present

## 2017-03-25 DIAGNOSIS — Z955 Presence of coronary angioplasty implant and graft: Secondary | ICD-10-CM

## 2017-03-25 MED ORDER — ATORVASTATIN CALCIUM 20 MG PO TABS: 20.0000 mg | ORAL_TABLET | ORAL | 3 refills | Status: DC

## 2017-03-29 DIAGNOSIS — F419 Anxiety disorder, unspecified: Secondary | ICD-10-CM | POA: Diagnosis not present

## 2017-03-29 DIAGNOSIS — L409 Psoriasis, unspecified: Secondary | ICD-10-CM | POA: Diagnosis not present

## 2017-03-29 DIAGNOSIS — I2510 Atherosclerotic heart disease of native coronary artery without angina pectoris: Secondary | ICD-10-CM | POA: Diagnosis not present

## 2017-03-30 ENCOUNTER — Encounter (HOSPITAL_COMMUNITY)
Admission: RE | Admit: 2017-03-30 | Discharge: 2017-03-30 | Disposition: A | Discharge status: Home / Self Care | Payer: BLUE CROSS/BLUE SHIELD | Source: Ambulatory Visit | Attending: Cardiovascular Disease | Admitting: Cardiovascular Disease

## 2017-03-30 ENCOUNTER — Encounter (HOSPITAL_COMMUNITY): Payer: BLUE CROSS/BLUE SHIELD

## 2017-03-30 DIAGNOSIS — Z955 Presence of coronary angioplasty implant and graft: Secondary | ICD-10-CM | POA: Insufficient documentation

## 2017-03-30 DIAGNOSIS — R748 Abnormal levels of other serum enzymes: Secondary | ICD-10-CM | POA: Insufficient documentation

## 2017-04-01 ENCOUNTER — Encounter (HOSPITAL_COMMUNITY)
Admission: RE | Admit: 2017-04-01 | Discharge: 2017-04-01 | Disposition: A | Discharge status: Home / Self Care | Payer: BLUE CROSS/BLUE SHIELD | Source: Ambulatory Visit | Attending: Cardiovascular Disease | Admitting: Cardiovascular Disease

## 2017-04-01 ENCOUNTER — Encounter (HOSPITAL_COMMUNITY): Payer: BLUE CROSS/BLUE SHIELD

## 2017-04-01 DIAGNOSIS — R748 Abnormal levels of other serum enzymes: Secondary | ICD-10-CM | POA: Diagnosis not present

## 2017-04-01 DIAGNOSIS — Z955 Presence of coronary angioplasty implant and graft: Secondary | ICD-10-CM | POA: Diagnosis not present

## 2017-04-04 ENCOUNTER — Encounter (HOSPITAL_COMMUNITY): Payer: BLUE CROSS/BLUE SHIELD

## 2017-04-04 ENCOUNTER — Encounter (HOSPITAL_COMMUNITY)
Admission: RE | Admit: 2017-04-04 | Discharge: 2017-04-04 | Disposition: A | Discharge status: Home / Self Care | Payer: BLUE CROSS/BLUE SHIELD | Source: Ambulatory Visit | Attending: Cardiovascular Disease | Admitting: Cardiovascular Disease

## 2017-04-04 DIAGNOSIS — R748 Abnormal levels of other serum enzymes: Secondary | ICD-10-CM | POA: Diagnosis not present

## 2017-04-04 DIAGNOSIS — Z955 Presence of coronary angioplasty implant and graft: Secondary | ICD-10-CM | POA: Diagnosis not present

## 2017-04-06 ENCOUNTER — Encounter (HOSPITAL_COMMUNITY): Payer: BLUE CROSS/BLUE SHIELD

## 2017-04-06 ENCOUNTER — Encounter (HOSPITAL_COMMUNITY)
Admission: RE | Admit: 2017-04-06 | Discharge: 2017-04-06 | Disposition: A | Discharge status: Home / Self Care | Payer: BLUE CROSS/BLUE SHIELD | Source: Ambulatory Visit | Attending: Cardiovascular Disease | Admitting: Cardiovascular Disease

## 2017-04-06 DIAGNOSIS — Z955 Presence of coronary angioplasty implant and graft: Secondary | ICD-10-CM | POA: Diagnosis not present

## 2017-04-06 DIAGNOSIS — R748 Abnormal levels of other serum enzymes: Secondary | ICD-10-CM | POA: Diagnosis not present

## 2017-04-06 NOTE — Progress Notes (Signed)
Cardiac Individual Treatment Plan  Patient Details  Name: Charles Montoya MRN: [id number] Date of Birth: 12-18-1970 Referring Provider:     CARDIAC REHAB PHASE II ORIENTATION from 02/10/2017 in [place]  Referring Provider  Grayland Jack MD      Initial Encounter Date:    CARDIAC REHAB PHASE II ORIENTATION from 02/10/2017 in [facility]  Date  02/10/17  Referring Provider  Nahser, Doren Custard MD      Visit Diagnosis: S/P drug eluting coronary stent placement 01/19/2017  Patient's Home Medications on Admission:  Current Outpatient Prescriptions:  .  ALPRAZolam (XANAX) 0.5 MG tablet, Take 0.5 mg by mouth 3 (three) times daily as needed for anxiety. Anxiety , Disp: , Rfl:  .  aspirin 81 MG chewable tablet, Chew 1 tablet (81 mg total) by mouth daily., Disp: 30 tablet, Rfl: 11 .  atorvastatin (LIPITOR) 20 MG tablet, Take 1 tablet (20 mg total) by mouth every other day., Disp: 90 tablet, Rfl: 3 .  clomiPRAMINE (ANAFRANIL) 25 MG capsule, Take 25 mg by mouth at bedtime., Disp: , Rfl:  .  nicotine polacrilex (NICORETTE) 4 MG gum, Take 4 mg by mouth as needed for smoking cessation., Disp: , Rfl:  .  nitroGLYCERIN (NITROSTAT) 0.4 MG SL tablet, Place 1 tablet (0.4 mg total) under the tongue every 5 (five) minutes as needed for chest pain., Disp: 25 tablet, Rfl: 3 .  omeprazole (PRILOSEC) 40 MG capsule, Take 40 mg by mouth as needed (HEARTBURN/ UPSET STOMACH). , Disp: , Rfl:  .  ticagrelor (BRILINTA) 90 MG TABS tablet, Take 1 tablet (90 mg total) by mouth 2 (two) times daily., Disp: 60 tablet, Rfl: 11  Past Medical History: Past Medical History:  Diagnosis Date  . Anxiety   . CAD (coronary artery disease)    a. cath 12/2016--> s/p DES to RPDA, 50% dRCA, 25% pLAD, 40% mLAD, 25% pro to mid CX  . Tobacco abuse     Tobacco Use: History  Smoking Status  . Former Smoker  . Packs/day: 1.50  . Types: Cigarettes  . Quit date: 01/19/2017   Smokeless Tobacco  . Never Used    Comment: Using nicoderm gum    Labs: Recent Review Flowsheet Data    Labs for ITP Cardiac and Pulmonary Rehab Latest Ref Rng & Units 01/19/2017 03/01/2017   Cholestrol 100 - 199 mg/dL 219(H) 165   LDLCALC 0 - 99 mg/dL 138(H) 95   HDL >39 mg/dL 33(L) 35(L)   Trlycerides 0 - 149 mg/dL 241(H) 177(H)      Capillary Blood Glucose: No results found for: GLUCAP   Exercise Target Goals:    Exercise Program Goal: Individual exercise prescription set with THRR, safety & activity barriers. Participant demonstrates ability to understand and report RPE using BORG scale, to self-measure pulse accurately, and to acknowledge the importance of the exercise prescription.  Exercise Prescription Goal: Starting with aerobic activity 30 plus minutes a day, 3 days per week for initial exercise prescription. Provide home exercise prescription and guidelines that participant acknowledges understanding prior to discharge.  Activity Barriers & Risk Stratification:     Activity Barriers & Cardiac Risk Stratification - 02/10/17 1427      Activity Barriers & Cardiac Risk Stratification   Activity Barriers Other (comment)   Comments Occasional hip pain   Cardiac Risk Stratification Moderate      6 Minute Walk:     6 Minute Walk    Row Name  02/10/17 1112         6 Minute Walk   Phase Initial     Distance 1636 feet     Walk Time 6 minutes     # of Rest Breaks 0     MPH 3.1     METS 5     RPE 11     VO2 Peak 17.3     Symptoms No     Resting HR 60 bpm     Resting BP 122/82     Max Ex. HR 87 bpm     Max Ex. BP 128/86     2 Minute Post BP 124/82        Oxygen Initial Assessment:   Oxygen Re-Evaluation:   Oxygen Discharge (Final Oxygen Re-Evaluation):   Initial Exercise Prescription:     Initial Exercise Prescription - 02/10/17 1100      Date of Initial Exercise RX and Referring Provider   Date 02/10/17   Referring Provider Nahser, Doren Custard  MD     Treadmill   MPH 3.2   Grade 1   Minutes 10   METs 3.4     Bike   Level 1   Minutes 10   METs 3.2     NuStep   Level 3   SPM 85   Minutes 10   METs 3     Prescription Details   Frequency (times per week) 3   Duration Progress to 45 minutes of aerobic exercise without signs/symptoms of physical distress     Intensity   THRR 40-80% of Max Heartrate 70-141   Ratings of Perceived Exertion 11-13   Perceived Dyspnea 0-4     Progression   Progression Continue to progress workloads to maintain intensity without signs/symptoms of physical distress.     Resistance Training   Training Prescription Yes   Weight 3   Reps 10-15      Perform Capillary Blood Glucose checks as needed.  Exercise Prescription Changes:     Exercise Prescription Changes    Row Name 03/04/17 1400 03/16/17 1700 03/18/17 1600 04/01/17 1700       Response to Exercise   Blood Pressure (Admit) 138/88 122/78 136/78 128/78    Blood Pressure (Exercise) 134/82 162/80 148/80 158/82    Blood Pressure (Exit) 128/84 122/70 138/82 138/80    Heart Rate (Admit) 78 bpm 73 bpm 82 bpm 80 bpm    Heart Rate (Exercise) 106 bpm 116 bpm 122 bpm 113 bpm    Heart Rate (Exit) 87 bpm 72 bpm 92 bpm 76 bpm    Rating of Perceived Exertion (Exercise) '12 13 13 13    ' Symptoms  - none none none    Comments  -  -  - Started using rower.    Duration Progress to 45 minutes of aerobic exercise without signs/symptoms of physical distress Progress to 45 minutes of aerobic exercise without signs/symptoms of physical distress Progress to 45 minutes of aerobic exercise without signs/symptoms of physical distress Progress to 45 minutes of aerobic exercise without signs/symptoms of physical distress    Intensity THRR unchanged THRR unchanged THRR unchanged THRR unchanged      Progression   Progression Continue to progress workloads to maintain intensity without signs/symptoms of physical distress. Continue to progress workloads to  maintain intensity without signs/symptoms of physical distress. Continue to progress workloads to maintain intensity without signs/symptoms of physical distress. Continue to progress workloads to maintain intensity without signs/symptoms of physical distress.  Average METs 3.6 4.9 5.2 4.8      Resistance Training   Training Prescription Yes Yes Yes Yes    Weight 4lb 5lbs 5lbs 5lbs    Reps 10-15 10-15 10-15 10-15    Time  - 10 Minutes 10 Minutes 10 Minutes      Treadmill   MPH 3.2 3.2 3.2 3.2    Grade '1 2 2 2    ' Minutes '10 15 15 15    ' METs 3.9 4.33 4.33 4.33      Bike   Level 1 -  -  -    Minutes 10 -  -  -    METs 3.25 -  -  -      NuStep   Level '4 6 6 6    ' SPM 85 85 85 85    Minutes '10 10 10 10    ' METs 3.6 4.1 4.8 4.6      Rower   Level  -  -  - 4    Watts  -  -  - 43    Minutes  -  -  - 10    METs  -  -  - 5.4      Adaptive Motion Trainer   Resistance  - 1 1 -    Grade  - 1 1 -    Minutes  - 5 4 -    METs  - 6.3 6.4 -      Home Exercise Plan   Plans to continue exercise at  - Home (comment) Home (comment) Home (comment)    Frequency  - Add 4 additional days to program exercise sessions. Add 4 additional days to program exercise sessions. Add 4 additional days to program exercise sessions.    Initial Home Exercises Provided  - 03/04/17 03/04/17 03/04/17       Exercise Comments:     Exercise Comments    Row Name 03/04/17 1429 04/01/17 1717         Exercise Comments Reviewed goals and home exercise with pt.  Making excellent progress with exercise.         Exercise Goals and Review:     Exercise Goals    Row Name 02/10/17 1120             Exercise Goals   Increase Physical Activity Yes       Intervention Provide advice, education, support and counseling about physical activity/exercise needs.;Develop an individualized exercise prescription for aerobic and resistive training based on initial evaluation findings, risk stratification, comorbidities  and participant's personal goals.       Expected Outcomes Achievement of increased cardiorespiratory fitness and enhanced flexibility, muscular endurance and strength shown through measurements of functional capacity and personal statement of participant.       Increase Strength and Stamina Yes       Intervention Provide advice, education, support and counseling about physical activity/exercise needs.;Develop an individualized exercise prescription for aerobic and resistive training based on initial evaluation findings, risk stratification, comorbidities and participant's personal goals.       Expected Outcomes Achievement of increased cardiorespiratory fitness and enhanced flexibility, muscular endurance and strength shown through measurements of functional capacity and personal statement of participant.          Exercise Goals Re-Evaluation :     Exercise Goals Re-Evaluation    Row Name 03/04/17 1427 04/01/17 1717           Exercise Goal Re-Evaluation  Exercise Goals Review Increase Physical Activity;Increase Strenth and Stamina Increase Physical Activity;Increase Strength and Stamina;Knowledge and understanding of Target Heart Rate Range (THRR);Able to understand and use rate of perceived exertion (RPE) scale      Comments Pt states he is getting comfortable with exercise and tolerating increases well. He says he is building his own bike and plans to start riding it next week and he is doing some walking at home Pt is doing well with exercise at cardiac rehab. Pt started using the rower and MET level average is 4.8. Pt know his THRR and understands and uses the RPE scale. Pt walks at least 1 day in addition to cardiac rehab and plans to ride his bike more weather permitting.      Expected Outcomes continue with exercise Rx, home exercise program and gradually incresae workloads as tolerated in order to increase cardiorespiratory fitness.  Increase workloads as tolerated and incorporate biking  back into home exercise program.          Discharge Exercise Prescription (Final Exercise Prescription Changes):     Exercise Prescription Changes - 04/01/17 1700      Response to Exercise   Blood Pressure (Admit) 128/78   Blood Pressure (Exercise) 158/82   Blood Pressure (Exit) 138/80   Heart Rate (Admit) 80 bpm   Heart Rate (Exercise) 113 bpm   Heart Rate (Exit) 76 bpm   Rating of Perceived Exertion (Exercise) 13   Symptoms none   Comments Started using rower.   Duration Progress to 45 minutes of aerobic exercise without signs/symptoms of physical distress   Intensity THRR unchanged     Progression   Progression Continue to progress workloads to maintain intensity without signs/symptoms of physical distress.   Average METs 4.8     Resistance Training   Training Prescription Yes   Weight 5lbs   Reps 10-15   Time 10 Minutes     Treadmill   MPH 3.2   Grade 2   Minutes 15   METs 4.33     NuStep   Level 6   SPM 85   Minutes 10   METs 4.6     Rower   Level 4   Watts 43   Minutes 10   METs 5.4     Adaptive Motion Trainer   Resistance --   Grade --   Minutes --   METs --     Home Exercise Plan   Plans to continue exercise at Home (comment)   Frequency Add 4 additional days to program exercise sessions.   Initial Home Exercises Provided 03/04/17      Nutrition:  Target Goals: Understanding of nutrition guidelines, daily intake of sodium <1594m, cholesterol <2084m calories 30% from fat and 7% or less from saturated fats, daily to have 5 or more servings of fruits and vegetables.  Biometrics:     Pre Biometrics - 02/10/17 1423      Pre Biometrics   Height 5' 10.5" (1.791 m)   Weight 179 lb 3.7 oz (81.3 kg)   Waist Circumference 35 inches   Hip Circumference 40 inches   Waist to Hip Ratio 0.88 %   BMI (Calculated) 25.4   Triceps Skinfold 13 mm   % Body Fat 22.7 %   Grip Strength 44 kg   Flexibility 13 in   Single Leg Stand 30 seconds        Nutrition Therapy Plan and Nutrition Goals:     Nutrition Therapy & Goals -  02/10/17 1417      Nutrition Therapy   Diet Therapeutic Lifestyle Changes     Personal Nutrition Goals   Nutrition Goal Wt loss of 1-2 lb/week to a wt loss goal of 6-10 lb at graduation from [facility], educate and counsel regarding individualized specific dietary modifications aiming towards targeted core components such as weight, hypertension, lipid management, diabetes, heart failure and other comorbidities.   Expected Outcomes Short Term Goal: Understand basic principles of dietary content, such as calories, fat, sodium, cholesterol and nutrients.;Long Term Goal: Adherence to prescribed nutrition plan.      Nutrition Discharge: Nutrition Scores:     Nutrition Assessments - 02/10/17 1452      MEDFICTS Scores   Pre Score 6      Nutrition Goals Re-Evaluation:   Nutrition Goals Re-Evaluation:   Nutrition Goals Discharge (Final Nutrition Goals Re-Evaluation):   Psychosocial: Target Goals: Acknowledge presence or absence of significant depression and/or stress, maximize coping skills, provide positive support system. Participant is able to verbalize types and ability to use techniques and skills needed for reducing stress and depression.  Initial Review & Psychosocial Screening:     Initial Psych Review & Screening - 02/10/17 1422      Initial Review   Current issues with None Identified     Family Dynamics   Good Support System? Yes     Barriers   Psychosocial barriers to participate in program There are no identifiable barriers or psychosocial needs.      Quality of Life Scores:     Quality of Life - 02/10/17 1121      Quality of Life Scores   Health/Function Pre 21.2 %   Socioeconomic Pre 21.36 %   Psych/Spiritual Pre 20.57 %   Family Pre 20 %   GLOBAL Pre 20.98 %      PHQ-9: Recent Review Flowsheet Data     Depression screen [facility] 2/9 02/16/2017   Decreased Interest 1   Down, Depressed, Hopeless 1   PHQ - 2 Score 2   Altered sleeping 1   Tired, decreased energy 1   Change in appetite 1   Feeling bad or failure about yourself  1   Trouble concentrating 0   Moving slowly or fidgety/restless 0   Suicidal thoughts 0   PHQ-9 Score 6   Difficult doing work/chores Somewhat difficult     Interpretation of Total Score  Total Score Depression Severity:  1-4 = Minimal depression, 5-9 = Mild depression, 10-14 = Moderate depression, 15-19 = Moderately severe depression, 20-27 = Severe depression   Psychosocial Evaluation and Intervention:     Psychosocial Evaluation - 02/16/17 1717      Psychosocial Evaluation & Interventions   Interventions Encouraged to exercise with the program and follow exercise prescription;Relaxation education;Stress management education   Comments pt exhibits health related anxiety symptoms.  pt encouraged to participate in program to reduce these symptoms. pt does have psychologist who he  has discussed his emotional concerns with.    Expected Outcomes pt will demonstrate positive outlook with good coping skills.    Continue Psychosocial Services  Follow up required by staff      Psychosocial Re-Evaluation:     Psychosocial Re-Evaluation    [place] Name 03/10/17 1025 04/06/17 1128           Psychosocial Re-Evaluation   Current issues with None Identified Current Stress Concerns      Comments  - -  steve reports that he sometimes feels anxious       Interventions Encouraged to attend Cardiac Rehabilitation for the exercise Encouraged to attend Cardiac Rehabilitation for the exercise;Stress management education      Continue Psychosocial Services  No Follow up required No Follow up required      Comments  - -  Charles Montoya says he has had some anxiety since quitting smoking         Psychosocial Discharge (Final Psychosocial Re-Evaluation):     Psychosocial  Re-Evaluation - 04/06/17 1128      Psychosocial Re-Evaluation   Current issues with Current Stress Concerns   Comments --  steve reports that he sometimes feels anxious    Interventions Encouraged to attend Cardiac Rehabilitation for the exercise;Stress management education   Continue Psychosocial Services  No Follow up required   Comments --  Charles Montoya says he has had some anxiety since quitting smoking      Vocational Rehabilitation: Provide vocational rehab assistance to qualifying candidates.   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      Education: Education Goals: Education classes will be provided on a weekly basis, covering required topics. Participant will state understanding/return demonstration of topics presented.  Learning Barriers/Preferences:     Learning Barriers/Preferences - 02/10/17 1420      Learning Barriers/Preferences   Learning Barriers None   Learning Preferences None      Education Topics: Count Your Pulse:  -Group instruction provided by verbal instruction, demonstration, patient participation and written materials to support subject.  Instructors address importance of being able to find your pulse and how to count your pulse when at home without a heart monitor.  Patients get hands on experience counting their pulse with staff help and individually.   CARDIAC REHAB PHASE II EXERCISE from 04/01/2017 in [facility]  Date  04/01/17  Instruction Review Code  2- meets goals/outcomes      Heart Attack, Angina, and Risk Factor Modification:  -Group instruction provided by verbal instruction, video, and written materials to support subject.  Instructors address signs and  symptoms of angina and heart attacks.    Also discuss risk factors for heart disease and how to make changes to improve heart health risk factors.   CARDIAC REHAB PHASE II EXERCISE from 04/01/2017 in [place]  Date  02/16/17  Instruction Review Code  2- meets goals/outcomes      Functional Fitness:  -Group instruction provided by verbal instruction, demonstration, patient participation, and written materials to support subject.  Instructors address safety measures for doing things around the house.  Discuss how to get up and down off the floor, how to pick things up properly, how to safely get out of a chair without assistance, and balance training.   CARDIAC REHAB PHASE II EXERCISE from 04/01/2017 in [facility]  Date  03/11/17  Educator  EP  Instruction Review Code  2- meets goals/outcomes      Meditation and Mindfulness:  -Group instruction provided by verbal instruction, patient participation, and written materials to support subject.  Instructor addresses importance of mindfulness and meditation practice to help reduce stress and improve awareness.  Instructor also leads  participants through a meditation exercise.    Stretching for Flexibility and Mobility:  -Group instruction provided by verbal instruction, patient participation, and written materials to support subject.  Instructors lead participants through series of stretches that are designed to increase flexibility thus improving mobility.  These stretches are additional exercise for major muscle groups that are typically performed during regular warm up and cool down.   CARDIAC REHAB PHASE II EXERCISE from 04/01/2017 in [place]  Date  02/18/17  Instruction Review Code  2- meets goals/outcomes      Hands Only CPR:  -Group verbal, video, and participation provides a basic overview of AHA guidelines for community CPR. Role-play of  emergencies allow participants the opportunity to practice calling for help and chest compression technique with discussion of AED use.   Hypertension: -Group verbal and written instruction that provides a basic overview of hypertension including the most recent diagnostic guidelines, risk factor reduction with self-care instructions and medication management.   CARDIAC REHAB PHASE II EXERCISE from 04/01/2017 in [place]  Date  03/04/17  Instruction Review Code  2- meets goals/outcomes       Nutrition I class: Heart Healthy Eating:  -Group instruction provided by PowerPoint slides, verbal discussion, and written materials to support subject matter. The instructor gives an explanation and review of the Therapeutic Lifestyle Changes diet recommendations, which includes a discussion on lipid goals, dietary fat, sodium, fiber, plant stanol/sterol esters, sugar, and the components of a well-balanced, healthy diet.   Nutrition II class: Lifestyle Skills:  -Group instruction provided by PowerPoint slides, verbal discussion, and written materials to support subject matter. The instructor gives an explanation and review of label reading, grocery shopping for heart health, heart healthy recipe modifications, and ways to make healthier choices when eating out.   Diabetes Question & Answer:  -Group instruction provided by PowerPoint slides, verbal discussion, and written materials to support subject matter. The instructor gives an explanation and review of diabetes co-morbidities, pre- and post-prandial blood glucose goals, pre-exercise blood glucose goals, signs, symptoms, and treatment of hypoglycemia and hyperglycemia, and foot care basics.   Diabetes Blitz:  -Group instruction provided by PowerPoint slides, verbal discussion, and written materials to support subject matter. The instructor gives an explanation and review of the physiology behind type 1 and type 2  diabetes, diabetes medications and rational behind using different medications, pre- and post-prandial blood glucose recommendations and Hemoglobin A1c goals, diabetes diet, and exercise including blood glucose guidelines for exercising safely.    Portion Distortion:  -Group instruction provided by PowerPoint slides, verbal discussion, written materials, and food models to support subject matter. The instructor gives an explanation of serving size versus portion size, changes in portions sizes over the last 20 years, and what consists of a serving from each food group.   CARDIAC REHAB PHASE II EXERCISE from 04/01/2017 in [place]  Date  03/09/17  Educator  RD  Instruction Review Code  2- meets goals/outcomes      Stress Management:  -Group instruction provided by verbal instruction, video, and written materials to support subject matter.  Instructors review role of stress in heart disease and how to cope with stress positively.     Exercising on Your Own:  -Group instruction provided by verbal instruction, power point, and written materials to support subject.  Instructors discuss benefits of exercise, components of exercise, frequency and intensity of exercise, and end points for exercise.  Also discuss use of nitroglycerin and activating EMS.  Review options of places to exercise outside of rehab.  Review guidelines for sex with heart disease.   CARDIAC REHAB PHASE II EXERCISE from 04/01/2017 in [place]  Date  03/30/17  Educator  EP  Instruction Review Code  2- meets goals/outcomes      Cardiac Drugs I:  -Group instruction provided by verbal instruction and written materials to support subject.  Instructor reviews cardiac drug classes: antiplatelets, anticoagulants, beta blockers, and statins.  Instructor discusses reasons, side effects, and lifestyle considerations for each drug class.   CARDIAC REHAB PHASE II EXERCISE  from 04/01/2017 in [place]  Instruction Review Code  2- meets goals/outcomes      Cardiac Drugs II:  -Group instruction provided by verbal instruction and written materials to support subject.  Instructor reviews cardiac drug classes: angiotensin converting enzyme inhibitors (ACE-I), angiotensin II receptor blockers (ARBs), nitrates, and calcium channel blockers.  Instructor discusses reasons, side effects, and lifestyle considerations for each drug class.   Anatomy and Physiology of the Circulatory System:  Group verbal and written instruction and models provide basic cardiac anatomy and physiology, with the coronary electrical and arterial systems. Review of: AMI, Angina, Valve disease, Heart Failure, Peripheral Artery Disease, Cardiac Arrhythmia, Pacemakers, and the ICD.   Other Education:  -Group or individual verbal, written, or video instructions that support the educational goals of the cardiac rehab program.   Knowledge Questionnaire Score:     Knowledge Questionnaire Score - 02/10/17 1106      Knowledge Questionnaire Score   Pre Score 22/24      Core Components/Risk Factors/Patient Goals at Admission:     Personal Goals and Risk Factors at Admission - 02/10/17 1418      Core Components/Risk Factors/Patient Goals on Admission   Tobacco Cessation Yes   Intervention Assist the participant in steps to quit. Provide individualized education and counseling about committing to Tobacco Cessation, relapse prevention, and pharmacological support that can be provided by physician.;Advice worker, assist with locating and accessing local/national Quit Smoking programs, and support quit date choice.   Expected Outcomes Short Term: Will demonstrate readiness to quit, by selecting a quit date.;Short Term: Will quit all tobacco product use, adhering to prevention of relapse plan.;Long Term: Complete abstinence from all tobacco products for  at least 12 months from quit date.      Core Components/Risk Factors/Patient Goals Review:      Goals and Risk Factor Review    Row Name 04/06/17 1132             Core Components/Risk Factors/Patient Goals Review   Personal Goals Review Tobacco Cessation       Review -  Charles Montoya continues to abstain from smoking       Expected Outcomes Charles Montoya is using nicotene gum to help with smoking cessation          Core Components/Risk Factors/Patient Goals at Discharge (Final Review):      Goals and Risk Factor Review - 04/06/17 1132      Core Components/Risk Factors/Patient Goals Review   Personal Goals Review Tobacco Cessation   Review --  Charles Montoya continues to abstain from smoking   Expected Outcomes Charles Montoya is using nicotene gum to help with smoking cessation      ITP Comments:     ITP Comments    Row Name 02/10/17 1111  ITP Comments Dr. Fransico Him, Medical Director          Comments: Charles Montoya is making expected progress toward personal goals after completing 21 sessions. Recommend continued exercise and life style modification education including  stress management and relaxation techniques to decrease cardiac risk profile. Charles Montoya continues to do well with smoking cessation.Barnet Pall, RN,BSN 04/06/2017 11:37 AM

## 2017-04-08 ENCOUNTER — Encounter (HOSPITAL_COMMUNITY)
Admission: RE | Admit: 2017-04-08 | Discharge: 2017-04-08 | Disposition: A | Discharge status: Home / Self Care | Payer: BLUE CROSS/BLUE SHIELD | Source: Ambulatory Visit | Attending: Cardiovascular Disease | Admitting: Cardiovascular Disease

## 2017-04-08 ENCOUNTER — Encounter (HOSPITAL_COMMUNITY): Payer: BLUE CROSS/BLUE SHIELD

## 2017-04-08 DIAGNOSIS — R748 Abnormal levels of other serum enzymes: Secondary | ICD-10-CM | POA: Diagnosis not present

## 2017-04-08 DIAGNOSIS — Z955 Presence of coronary angioplasty implant and graft: Secondary | ICD-10-CM

## 2017-04-11 ENCOUNTER — Encounter (HOSPITAL_COMMUNITY)
Admission: RE | Admit: 2017-04-11 | Discharge: 2017-04-11 | Disposition: A | Discharge status: Home / Self Care | Payer: BLUE CROSS/BLUE SHIELD | Source: Ambulatory Visit | Attending: Cardiovascular Disease | Admitting: Cardiovascular Disease

## 2017-04-11 ENCOUNTER — Encounter (HOSPITAL_COMMUNITY): Payer: BLUE CROSS/BLUE SHIELD

## 2017-04-11 DIAGNOSIS — Z955 Presence of coronary angioplasty implant and graft: Secondary | ICD-10-CM

## 2017-04-11 DIAGNOSIS — R748 Abnormal levels of other serum enzymes: Secondary | ICD-10-CM | POA: Diagnosis not present

## 2017-04-13 ENCOUNTER — Encounter (HOSPITAL_COMMUNITY): Payer: BLUE CROSS/BLUE SHIELD

## 2017-04-13 ENCOUNTER — Encounter (HOSPITAL_COMMUNITY)
Admission: RE | Admit: 2017-04-13 | Discharge: 2017-04-13 | Disposition: A | Discharge status: Home / Self Care | Payer: BLUE CROSS/BLUE SHIELD | Source: Ambulatory Visit | Attending: Cardiovascular Disease | Admitting: Cardiovascular Disease

## 2017-04-13 DIAGNOSIS — Z955 Presence of coronary angioplasty implant and graft: Secondary | ICD-10-CM

## 2017-04-13 DIAGNOSIS — R748 Abnormal levels of other serum enzymes: Secondary | ICD-10-CM | POA: Diagnosis not present

## 2017-04-15 ENCOUNTER — Encounter (HOSPITAL_COMMUNITY)
Admission: RE | Admit: 2017-04-15 | Discharge: 2017-04-15 | Disposition: A | Discharge status: Home / Self Care | Payer: BLUE CROSS/BLUE SHIELD | Source: Ambulatory Visit | Attending: Cardiovascular Disease | Admitting: Cardiovascular Disease

## 2017-04-15 ENCOUNTER — Encounter (HOSPITAL_COMMUNITY): Payer: BLUE CROSS/BLUE SHIELD

## 2017-04-15 DIAGNOSIS — R748 Abnormal levels of other serum enzymes: Secondary | ICD-10-CM | POA: Diagnosis not present

## 2017-04-15 DIAGNOSIS — Z955 Presence of coronary angioplasty implant and graft: Secondary | ICD-10-CM | POA: Diagnosis not present

## 2017-04-18 ENCOUNTER — Encounter (HOSPITAL_COMMUNITY): Payer: BLUE CROSS/BLUE SHIELD

## 2017-04-18 ENCOUNTER — Ambulatory Visit (INDEPENDENT_AMBULATORY_CARE_PROVIDER_SITE_OTHER): Payer: BLUE CROSS/BLUE SHIELD | Admitting: Cardiovascular Disease

## 2017-04-18 ENCOUNTER — Encounter (HOSPITAL_COMMUNITY)
Admission: RE | Admit: 2017-04-18 | Discharge: 2017-04-18 | Disposition: A | Discharge status: Home / Self Care | Payer: BLUE CROSS/BLUE SHIELD | Source: Ambulatory Visit | Attending: Cardiovascular Disease | Admitting: Cardiovascular Disease

## 2017-04-18 ENCOUNTER — Encounter: Payer: Self-pay | Admitting: Cardiovascular Disease

## 2017-04-18 VITALS — BP 120/84 | HR 62 | Ht 70.5 in | Wt 179.4 lb

## 2017-04-18 DIAGNOSIS — E782 Mixed hyperlipidemia: Secondary | ICD-10-CM | POA: Diagnosis not present

## 2017-04-18 DIAGNOSIS — Z955 Presence of coronary angioplasty implant and graft: Secondary | ICD-10-CM | POA: Diagnosis not present

## 2017-04-18 DIAGNOSIS — I25110 Atherosclerotic heart disease of native coronary artery with unstable angina pectoris: Secondary | ICD-10-CM

## 2017-04-18 DIAGNOSIS — I2510 Atherosclerotic heart disease of native coronary artery without angina pectoris: Secondary | ICD-10-CM | POA: Diagnosis not present

## 2017-04-18 DIAGNOSIS — R748 Abnormal levels of other serum enzymes: Secondary | ICD-10-CM | POA: Diagnosis not present

## 2017-04-18 LAB — BASIC METABOLIC PANEL
BUN/Creatinine Ratio: 14 (ref 9–20)
BUN: 16 mg/dL (ref 6–24)
CHLORIDE: 99 mmol/L (ref 96–106)
CO2: 25 mmol/L (ref 20–29)
Calcium: 10.9 mg/dL — ABNORMAL HIGH (ref 8.7–10.2)
Creatinine, Ser: 1.13 mg/dL (ref 0.76–1.27)
GFR, EST AFRICAN AMERICAN: 91 mL/min/{1.73_m2} (ref >59)
GFR, EST NON AFRICAN AMERICAN: 79 mL/min/{1.73_m2} (ref >59)
Glucose: 97 mg/dL (ref 65–99)
Potassium: 5.1 mmol/L (ref 3.5–5.2)
Sodium: 138 mmol/L (ref 134–144)

## 2017-04-18 LAB — LIPID PANEL
CHOL/HDL RATIO: 4 ratio (ref 0.0–5.0)
CHOLESTEROL TOTAL: 145 mg/dL (ref 100–199)
HDL: 36 mg/dL — ABNORMAL LOW (ref >39)
LDL CALC: 86 mg/dL (ref 0–99)
Triglycerides: 113 mg/dL (ref 0–149)
VLDL Cholesterol Cal: 23 mg/dL (ref 5–40)

## 2017-04-18 LAB — HEPATIC FUNCTION PANEL
ALBUMIN: 5 g/dL (ref 3.5–5.5)
ALT: 36 IU/L (ref 0–44)
AST: 31 IU/L (ref 0–40)
Alkaline Phosphatase: 79 IU/L (ref 39–117)
BILIRUBIN TOTAL: 0.2 mg/dL (ref 0.0–1.2)
BILIRUBIN, DIRECT: 0.08 mg/dL (ref 0.00–0.40)
Total Protein: 7.2 g/dL (ref 6.0–8.5)

## 2017-04-18 NOTE — Patient Instructions (Signed)
Medication Instructions:  Your physician recommends that you continue on your current medications as directed. Please refer to the Current Medication list given to you today.   Labwork: TODAY - cholesterol, basic metabolic panel, liver panel   Testing/Procedures: None Ordered   Follow-Up: Your physician wants you to follow-up in: 6 months with Dr. Nahser.  You will receive a reminder letter in the mail two months in advance. If you don't receive a letter, please call our office to schedule the follow-up appointment.   If you need a refill on your cardiac medications before your next appointment, please call your pharmacy.   Thank you for choosing [facility]! Swan Fairfax, RN [phone]    

## 2017-04-18 NOTE — Progress Notes (Signed)
Cardiology Office Note:    Date:  04/18/2017   ID:  OCTAVIOUS ZIDEK, DOB 1971-01-06, MRN [id number]  PCP:  London Pepper, MD  Cardiologist:  Mertie Moores, MD    Referring MD: [facility]*   Problem List 1. CAD - s/p DES to PDA 2. Hyperlipidemia:   Chief Complaint  Patient presents with  . Coronary Artery Disease    History of Present Illness:    Charles Montoya is a 46 y.o. male with a hx of Coronary artery disease. He status post stenting of his posterior descending artery in June, 2018. He has moderate disease and his left anterior descending artery and circumflex proximal artery.  Is exercising 4 times a week. Has stopped smoking  Using nicorette gum .   He was having lots of fatigue - he reduced his atorvastatin to 20 mg QOD.   Fatigue  Is better.    Past Medical History:  Diagnosis Date  . Anxiety   . CAD (coronary artery disease)    a. cath 12/2016--> s/p DES to RPDA, 50% dRCA, 25% pLAD, 40% mLAD, 25% pro to mid CX  . Tobacco abuse     Past Surgical History:  Procedure Laterality Date  . CARDIAC CATHETERIZATION    . CORONARY STENT INTERVENTION N/A 01/19/2017   Procedure: Coronary Stent Intervention;  Surgeon: Jettie Booze, MD;  Location: [place] [facility];  Service: Cardiovascular;  Laterality: N/A;  . HERNIA REPAIR    . LEFT HEART CATH AND CORONARY ANGIOGRAPHY N/A 01/19/2017   Procedure: Left Heart Cath and Coronary Angiography;  Surgeon: Jettie Booze, MD;  Location: [facility];  Service: Cardiovascular;  Laterality: N/A;    Current Medications: Current Meds  Medication Sig  . ALPRAZolam (XANAX) 0.5 MG tablet Take 0.5 mg by mouth 3 (three) times daily as needed for anxiety. Anxiety   . aspirin 81 MG chewable tablet Chew 1 tablet (81 mg total) by mouth daily.  Marland Kitchen atorvastatin (LIPITOR) 20 MG tablet Take 1 tablet (20 mg total) by mouth every other day.  . clomiPRAMINE (ANAFRANIL) 25 MG capsule Take 25 mg by mouth at bedtime.  .  nicotine polacrilex (NICORETTE) 4 MG gum Take 4 mg by mouth as needed for smoking cessation.  . nitroGLYCERIN (NITROSTAT) 0.4 MG SL tablet Place 1 tablet (0.4 mg total) under the tongue every 5 (five) minutes as needed for chest pain.  Marland Kitchen omeprazole (PRILOSEC) 40 MG capsule Take 40 mg by mouth as needed (HEARTBURN/ UPSET STOMACH).   Marland Kitchen ticagrelor (BRILINTA) 90 MG TABS tablet Take 1 tablet (90 mg total) by mouth 2 (two) times daily.     Allergies:   Codeine   Social History   Social History  . Marital status: Single    Spouse name: N/A  . Number of children: N/A  . Years of education: N/A   Social History Main Topics  . Smoking status: Former Smoker    Packs/day: 1.50    Types: Cigarettes    Quit date: 01/19/2017  . Smokeless tobacco: Never Used     Comment: Using nicoderm gum  . Alcohol use No  . Drug use: No  . Sexual activity: Not Asked   Other Topics Concern  . None   Social History Narrative  . None     Family History: The patient's family history includes CAD in his father; Hypertension in his mother. ROS:   Please see the history of present illness.  All other systems reviewed and are negative.  EKGs/Labs/Other Studies Reviewed:    The following studies were reviewed today:  EKG:  EKG is not  ordered today.     Recent Labs: 01/20/2017: Hemoglobin 13.0; Platelets 198 02/23/2017: BUN 16; Creatinine, Ser 1.13; Potassium 4.3; Sodium 140 03/01/2017: ALT 21  Recent Lipid Panel    Component Value Date/Time   CHOL 165 03/01/2017 1125   TRIG 177 (H) 03/01/2017 1125   HDL 35 (L) 03/01/2017 1125   CHOLHDL 4.7 03/01/2017 1125   CHOLHDL 6.6 01/19/2017 0845   VLDL 48 (H) 01/19/2017 0845   LDLCALC 95 03/01/2017 1125    Physical Exam:    VS:  BP 120/84   Pulse 62   Ht 5' 10.5" (1.791 m)   Wt 179 lb 6.4 oz (81.4 kg)   SpO2 97%   BMI 25.38 kg/m     Wt Readings from Last 3 Encounters:  04/18/17 179 lb 6.4 oz (81.4 kg)  02/10/17 179 lb 3.7 oz (81.3 kg)    02/01/17 178 lb 6.4 oz (80.9 kg)     GEN:  Well nourished, well developed in no acute distress HEENT: Normal NECK: No JVD; No carotid bruits LYMPHATICS: No lymphadenopathy CARDIAC: RRR, no murmurs, rubs, gallops RESPIRATORY:  Clear to auscultation without rales, wheezing or rhonchi  ABDOMEN: Soft, non-tender, non-distended MUSCULOSKELETAL:  No edema; No deformity  SKIN: Warm and dry NEUROLOGIC:  Alert and oriented x 3 PSYCHIATRIC:  Normal affect   ASSESSMENT:    No diagnosis found. PLAN:    In order of problems listed above:  1. CAD -   Stable , no angina  2. Hyperlipidema:   His diet is better.   LDL was 95 in Aug.  He has had to decrease his dose of atorvastatin  due to fatigue. Will check labs today  I anticipate changing to Crestor if his labs are not well controlled.  Will see him in 6 months    Medication Adjustments/Labs and Tests Ordered: Current medicines are reviewed at length with the patient today.  Concerns regarding medicines are outlined above.  No orders of the defined types were placed in this encounter.  No orders of the defined types were placed in this encounter.   Signed, Mertie Moores, MD  04/18/2017 9:23 AM    [place]

## 2017-04-19 ENCOUNTER — Telehealth: Payer: Self-pay | Admitting: Nurse Practitioner

## 2017-04-19 DIAGNOSIS — E782 Mixed hyperlipidemia: Secondary | ICD-10-CM

## 2017-04-19 MED ORDER — ROSUVASTATIN CALCIUM 10 MG PO TABS: 10.0000 mg | ORAL_TABLET | Freq: Every day | ORAL | 3 refills | Status: DC

## 2017-04-19 NOTE — Telephone Encounter (Signed)
-----   Message from Thayer Headings, MD sent at 04/18/2017  5:01 PM EDT ----- LDL is still not to goal. On Atorvastatin QOD ( did not tolerate daily ) Lets try rosuvastatin 10 mg a day  Check labs in 3 months

## 2017-04-19 NOTE — Telephone Encounter (Signed)
Reviewed results and plan of care with patient. He verbalized agreement to stop atorvastatin and start rosuvastatin 10 mg daily. He is scheduled for 3 mo repeat fasting lab work on 1/3. I advised him to call back prior to that appointment with any questions or concerns. He thanked me for the call.

## 2017-04-20 ENCOUNTER — Encounter (HOSPITAL_COMMUNITY): Payer: BLUE CROSS/BLUE SHIELD

## 2017-04-20 ENCOUNTER — Encounter (HOSPITAL_COMMUNITY)
Admission: RE | Admit: 2017-04-20 | Discharge: 2017-04-20 | Disposition: A | Discharge status: Home / Self Care | Payer: BLUE CROSS/BLUE SHIELD | Source: Ambulatory Visit | Attending: Cardiovascular Disease | Admitting: Cardiovascular Disease

## 2017-04-20 DIAGNOSIS — Z955 Presence of coronary angioplasty implant and graft: Secondary | ICD-10-CM | POA: Diagnosis not present

## 2017-04-20 DIAGNOSIS — R748 Abnormal levels of other serum enzymes: Secondary | ICD-10-CM | POA: Diagnosis not present

## 2017-04-22 ENCOUNTER — Encounter (HOSPITAL_COMMUNITY): Payer: BLUE CROSS/BLUE SHIELD

## 2017-04-22 ENCOUNTER — Encounter (HOSPITAL_COMMUNITY)
Admission: RE | Admit: 2017-04-22 | Discharge: 2017-04-22 | Disposition: A | Discharge status: Home / Self Care | Payer: BLUE CROSS/BLUE SHIELD | Source: Ambulatory Visit | Attending: Cardiovascular Disease | Admitting: Cardiovascular Disease

## 2017-04-22 DIAGNOSIS — Z955 Presence of coronary angioplasty implant and graft: Secondary | ICD-10-CM

## 2017-04-22 DIAGNOSIS — R748 Abnormal levels of other serum enzymes: Secondary | ICD-10-CM | POA: Diagnosis not present

## 2017-04-25 ENCOUNTER — Encounter (HOSPITAL_COMMUNITY)
Admission: RE | Admit: 2017-04-25 | Discharge: 2017-04-25 | Disposition: A | Discharge status: Home / Self Care | Payer: BLUE CROSS/BLUE SHIELD | Source: Ambulatory Visit | Attending: Cardiovascular Disease | Admitting: Cardiovascular Disease

## 2017-04-25 ENCOUNTER — Encounter (HOSPITAL_COMMUNITY): Payer: BLUE CROSS/BLUE SHIELD

## 2017-04-25 DIAGNOSIS — Z955 Presence of coronary angioplasty implant and graft: Secondary | ICD-10-CM

## 2017-04-25 DIAGNOSIS — R748 Abnormal levels of other serum enzymes: Secondary | ICD-10-CM | POA: Diagnosis not present

## 2017-04-27 ENCOUNTER — Encounter (HOSPITAL_COMMUNITY)
Admission: RE | Admit: 2017-04-27 | Discharge: 2017-04-27 | Disposition: A | Discharge status: Home / Self Care | Payer: BLUE CROSS/BLUE SHIELD | Source: Ambulatory Visit | Attending: Cardiovascular Disease | Admitting: Cardiovascular Disease

## 2017-04-27 ENCOUNTER — Encounter (HOSPITAL_COMMUNITY): Payer: BLUE CROSS/BLUE SHIELD

## 2017-04-27 DIAGNOSIS — Z955 Presence of coronary angioplasty implant and graft: Secondary | ICD-10-CM

## 2017-04-27 DIAGNOSIS — R748 Abnormal levels of other serum enzymes: Secondary | ICD-10-CM | POA: Diagnosis not present

## 2017-04-27 NOTE — Progress Notes (Signed)
Charles Montoya 46 y.o. male       Nutrition Note  1. S/P drug eluting coronary stent placement 01/19/2017    Labs:  Lipid Panel     Component Value Date/Time   CHOL 145 04/18/2017 0947   TRIG 113 04/18/2017 0947   HDL 36 (L) 04/18/2017 0947   CHOLHDL 4.0 04/18/2017 0947   CHOLHDL 6.6 01/19/2017 0845   VLDL 48 (H) 01/19/2017 0845   LDLCALC 86 04/18/2017 0947   No results found for: HGBA1C CBG (last 3)  No results for input(s): GLUCAP in the last 72 hours.  Nutrition Diagnosis ? Food-and nutrition-related knowledge deficit related to lack of exposure to information as related to diagnosis of: ? CVD  ? Overweight related to excessive energy intake as evidenced by a BMI of 25.4 Nutrition Note Spoke with pt. Nutrition plan reviewed with pt. Pt is following Step 2 of the Therapeutic Lifestyle Changes diet. Pt lipids improved over the past 3 months. Lipid panel discussed with pt. Dietary modifications to help improve lipids discussed (e.g. Increasing dietary soluble fiber and plant stanol/sterol ester foods). Pt currently does not consume many grains.  Pt expressed understanding of the information reviewed. Pt aware of nutrition education classes offered.  Nutrition Goal(s):  ? Wt loss of 1-2 lb/week to a wt loss goal of 6-10 lb at graduation from [employer].  Nutrition Intervention ? Pt's individual nutrition plan reviewed with pt. ? Pt given handouts for: ? Increasing fiber in the diet ? Nutrition I class ? Nutrition II class  Plan:  Pt to attend nutrition classes ? Nutrition I ? Nutrition II ? Portion Distortion - met 03/09/17 Will provide client-centered nutrition education as part of interdisciplinary care.   Monitor and evaluate progress toward nutrition goal with team.  Derek Mound, M.Ed, RD, LDN, CDE 04/27/2017 3:29 PM

## 2017-04-28 ENCOUNTER — Telehealth: Payer: Self-pay | Admitting: Cardiovascular Disease

## 2017-04-28 MED ORDER — ROSUVASTATIN CALCIUM 10 MG PO TABS: 10.0000 mg | ORAL_TABLET | ORAL | 3 refills | Status: DC

## 2017-04-28 NOTE — Telephone Encounter (Signed)
Spoke with patient who states he is feeling generally "not well."  He states he is having fatigue and is tiring easily when exercising. He asks if these symptoms could be from Crestor. I advised that we could try a lower dose for a while to see if symptoms improve. He agrees to take Crestor 10 mg three times per week (Mondays, Wednesdays, and Fridays) and to call back to let us know how he is doing. He thanked me for the call.

## 2017-04-28 NOTE — Telephone Encounter (Signed)
°  New Prob  Pt has questions regarding Crestor. Please call.

## 2017-04-29 ENCOUNTER — Encounter (HOSPITAL_COMMUNITY)
Admission: RE | Admit: 2017-04-29 | Discharge: 2017-04-29 | Disposition: A | Discharge status: Home / Self Care | Payer: BLUE CROSS/BLUE SHIELD | Source: Ambulatory Visit | Attending: Cardiovascular Disease | Admitting: Cardiovascular Disease

## 2017-04-29 ENCOUNTER — Encounter (HOSPITAL_COMMUNITY): Payer: BLUE CROSS/BLUE SHIELD

## 2017-04-29 DIAGNOSIS — Z955 Presence of coronary angioplasty implant and graft: Secondary | ICD-10-CM | POA: Diagnosis not present

## 2017-04-29 DIAGNOSIS — R748 Abnormal levels of other serum enzymes: Secondary | ICD-10-CM | POA: Diagnosis not present

## 2017-05-02 ENCOUNTER — Encounter (HOSPITAL_COMMUNITY): Payer: BLUE CROSS/BLUE SHIELD

## 2017-05-02 ENCOUNTER — Encounter (HOSPITAL_COMMUNITY)
Admission: RE | Admit: 2017-05-02 | Discharge: 2017-05-02 | Disposition: A | Discharge status: Home / Self Care | Payer: BLUE CROSS/BLUE SHIELD | Source: Ambulatory Visit | Attending: Cardiovascular Disease | Admitting: Cardiovascular Disease

## 2017-05-02 DIAGNOSIS — R748 Abnormal levels of other serum enzymes: Secondary | ICD-10-CM | POA: Diagnosis not present

## 2017-05-02 DIAGNOSIS — Z955 Presence of coronary angioplasty implant and graft: Secondary | ICD-10-CM | POA: Diagnosis not present

## 2017-05-02 DIAGNOSIS — F410 Panic disorder [episodic paroxysmal anxiety] without agoraphobia: Secondary | ICD-10-CM | POA: Diagnosis not present

## 2017-05-03 NOTE — Progress Notes (Signed)
Cardiac Individual Treatment Plan  Patient Details  Name: Charles Montoya MRN: [id number] Date of Birth: 1970/11/18 Referring Provider:     CARDIAC REHAB PHASE II ORIENTATION from 02/10/2017 in [place]  Referring Provider  Grayland Jack MD      Initial Encounter Date:    CARDIAC REHAB PHASE II ORIENTATION from 02/10/2017 in [place]  Date  02/10/17  Referring Provider  Nahser, Doren Custard MD      Visit Diagnosis: S/P drug eluting coronary stent placement 01/19/2017  Patient's Home Medications on Admission:  Current Outpatient Prescriptions:  .  ALPRAZolam (XANAX) 0.5 MG tablet, Take 0.5 mg by mouth 3 (three) times daily as needed for anxiety. Anxiety , Disp: , Rfl:  .  aspirin 81 MG chewable tablet, Chew 1 tablet (81 mg total) by mouth daily., Disp: 30 tablet, Rfl: 11 .  clomiPRAMINE (ANAFRANIL) 25 MG capsule, Take 25 mg by mouth at bedtime., Disp: , Rfl:  .  nicotine polacrilex (NICORETTE) 4 MG gum, Take 4 mg by mouth as needed for smoking cessation., Disp: , Rfl:  .  nitroGLYCERIN (NITROSTAT) 0.4 MG SL tablet, Place 1 tablet (0.4 mg total) under the tongue every 5 (five) minutes as needed for chest pain., Disp: 25 tablet, Rfl: 3 .  omeprazole (PRILOSEC) 40 MG capsule, Take 40 mg by mouth as needed (HEARTBURN/ UPSET STOMACH). , Disp: , Rfl:  .  rosuvastatin (CRESTOR) 10 MG tablet, Take 1 tablet (10 mg total) by mouth 3 (three) times a week., Disp: 90 tablet, Rfl: 3 .  ticagrelor (BRILINTA) 90 MG TABS tablet, Take 1 tablet (90 mg total) by mouth 2 (two) times daily., Disp: 60 tablet, Rfl: 11  Past Medical History: Past Medical History:  Diagnosis Date  . Anxiety   . CAD (coronary artery disease)    a. cath 12/2016--> s/p DES to RPDA, 50% dRCA, 25% pLAD, 40% mLAD, 25% pro to mid CX  . Tobacco abuse     Tobacco Use: History  Smoking Status  . Former Smoker  . Packs/day: 1.50  . Types: Cigarettes  . Quit date:  01/19/2017  Smokeless Tobacco  . Never Used    Comment: Using nicoderm gum    Labs: Recent Review Flowsheet Data    Labs for ITP Cardiac and Pulmonary Rehab Latest Ref Rng & Units 01/19/2017 03/01/2017 04/18/2017   Cholestrol 100 - 199 mg/dL 219(H) 165 145   LDLCALC 0 - 99 mg/dL 138(H) 95 86   HDL >39 mg/dL 33(L) 35(L) 36(L)   Trlycerides 0 - 149 mg/dL 241(H) 177(H) 113      Capillary Blood Glucose: No results found for: GLUCAP   Exercise Target Goals:    Exercise Program Goal: Individual exercise prescription set with THRR, safety & activity barriers. Participant demonstrates ability to understand and report RPE using BORG scale, to self-measure pulse accurately, and to acknowledge the importance of the exercise prescription.  Exercise Prescription Goal: Starting with aerobic activity 30 plus minutes a day, 3 days per week for initial exercise prescription. Provide home exercise prescription and guidelines that participant acknowledges understanding prior to discharge.  Activity Barriers & Risk Stratification:     Activity Barriers & Cardiac Risk Stratification - 02/10/17 1427      Activity Barriers & Cardiac Risk Stratification   Activity Barriers Other (comment)   Comments Occasional hip pain   Cardiac Risk Stratification Moderate      6 Minute Walk:     6  Minute Walk    Row Name 02/10/17 1112 04/27/17 1524       6 Minute Walk   Phase Initial Discharge    Distance 1636 feet 2000 feet    Distance % Change  - 22.25 %    Distance Feet Change  - 364 ft    Walk Time 6 minutes 6 minutes    # of Rest Breaks 0 0    MPH 3.1 3.79    METS 5 6.07    RPE 11 15    VO2 Peak 17.3 21.26    Symptoms No No    Resting HR 60 bpm 83 bpm    Resting BP 122/82 118/80    Max Ex. HR 87 bpm 112 bpm    Max Ex. BP 128/86 158/82    2 Minute Post BP 124/82  -       Oxygen Initial Assessment:   Oxygen Re-Evaluation:   Oxygen Discharge (Final Oxygen Re-Evaluation):   Initial  Exercise Prescription:     Initial Exercise Prescription - 02/10/17 1100      Date of Initial Exercise RX and Referring Provider   Date 02/10/17   Referring Provider Nahser, Doren Custard MD     Treadmill   MPH 3.2   Grade 1   Minutes 10   METs 3.4     Bike   Level 1   Minutes 10   METs 3.2     NuStep   Level 3   SPM 85   Minutes 10   METs 3     Prescription Details   Frequency (times per week) 3   Duration Progress to 45 minutes of aerobic exercise without signs/symptoms of physical distress     Intensity   THRR 40-80% of Max Heartrate 70-141   Ratings of Perceived Exertion 11-13   Perceived Dyspnea 0-4     Progression   Progression Continue to progress workloads to maintain intensity without signs/symptoms of physical distress.     Resistance Training   Training Prescription Yes   Weight 3   Reps 10-15      Perform Capillary Blood Glucose checks as needed.  Exercise Prescription Changes:      Exercise Prescription Changes    Row Name 03/04/17 1400 03/16/17 1700 03/18/17 1600 04/01/17 1700 04/12/17 1600     Response to Exercise   Blood Pressure (Admit) 138/88 122/78 136/78 128/78 140/80   Blood Pressure (Exercise) 134/82 162/80 148/80 158/82 144/80   Blood Pressure (Exit) 128/84 122/70 138/82 138/80 138/80   Heart Rate (Admit) 78 bpm 73 bpm 82 bpm 80 bpm 67 bpm   Heart Rate (Exercise) 106 bpm 116 bpm 122 bpm 113 bpm 114 bpm   Heart Rate (Exit) 87 bpm 72 bpm 92 bpm 76 bpm 66 bpm   Rating of Perceived Exertion (Exercise) _0 Symptoms  - none none none none   Comments  -  -  - Started using rower. Started using rower.   Duration Progress to 45 minutes of aerobic exercise without signs/symptoms of physical distress Progress to 45 minutes of aerobic exercise without signs/symptoms of physical distress Progress to 45 minutes of aerobic exercise without signs/symptoms of physical distress Progress to 45 minutes of aerobic exercise without  signs/symptoms of physical distress Progress to 45 minutes of aerobic exercise without signs/symptoms of physical distress   Intensity _1      Progression   Progression Continue  to progress workloads to maintain intensity without signs/symptoms of physical distress. Continue to progress workloads to maintain intensity without signs/symptoms of physical distress. Continue to progress workloads to maintain intensity without signs/symptoms of physical distress. Continue to progress workloads to maintain intensity without signs/symptoms of physical distress. Continue to progress workloads to maintain intensity without signs/symptoms of physical distress.   Average METs 3.6 4.9 5.2 4.8 5     Resistance Training   Training Prescription _0    Weight 4lb 5lbs 5lbs 5lbs 5lbs   Reps 10-15 10-15 10-15 10-15 10-15   Time  - 10 Minutes 10 Minutes 10 Minutes 10 Minutes     Treadmill   MPH 3.2 3.2 3.2 3.2 3.2   Grade _1 Minutes _2 METs 3.9 4.33 4.33 4.33 4.33     Bike   Level 1 -  -  -  -   Minutes 10 -  -  -  -   METs 3.25 -  -  -  -     NuStep   Level _3 SPM 85 85 85 85 85   Minutes _4 METs 3.6 4.1 4.8 4.6 4.8     Rower   Level  -  -  - 4 4   Watts  -  -  - 43 53   Minutes  -  -  - 10 10   METs  -  -  - 5.4 5.8     Adaptive Motion Trainer   Resistance  - 1 1 -  -   Grade  - 1 1 -  -   Minutes  - 5 4 -  -   METs  - 6.3 6.4 -  -     Home Exercise Plan   Plans to continue exercise at  - Home (comment) Home (comment) Home (comment) Home (comment)   Frequency  - Add 4 additional days to program exercise sessions. Add 4 additional days to program exercise sessions. Add 4 additional days to program exercise sessions. Add 4 additional days to program exercise sessions.   Initial Home Exercises Provided  - 03/04/17 03/04/17 03/04/17 03/04/17   Row Name 04/25/17  1700             Response to Exercise   Blood Pressure (Admit) 110/82       Blood Pressure (Exercise) 158/82       Blood Pressure (Exit) 122/82       Heart Rate (Admit) 85 bpm       Heart Rate (Exercise) 115 bpm       Heart Rate (Exit) 83 bpm       Rating of Perceived Exertion (Exercise) 12       Symptoms none       Comments -       Duration Progress to 45 minutes of aerobic exercise without signs/symptoms of physical distress       Intensity THRR unchanged         Progression   Progression Continue to progress workloads to maintain intensity without signs/symptoms of physical distress.       Average METs 5.2         Resistance Training   Training Prescription Yes       Weight 5lbs       Reps 10-15  Time 10 Minutes         Interval Training   Interval Training No         Treadmill   MPH 3.2       Grade 4       Minutes 15       METs 5.21         NuStep   Level 7       SPM 85       Minutes 10       METs 4.8         Rower   Level 7       Watts 49       Minutes 10       METs 5.55         Home Exercise Plan   Plans to continue exercise at Home (comment)       Frequency Add 4 additional days to program exercise sessions.       Initial Home Exercises Provided 03/04/17          Exercise Comments:      Exercise Comments    Row Name 03/04/17 1429 04/01/17 1717 04/13/17 1500 04/27/17 1419     Exercise Comments Reviewed goals and home exercise with pt.  Making excellent progress with exercise. Reviewed METs with patient. Reviewed METs and goals with patient.       Exercise Goals and Review:      Exercise Goals    Row Name 02/10/17 1120             Exercise Goals   Increase Physical Activity Yes       Intervention Provide advice, education, support and counseling about physical activity/exercise needs.;Develop an individualized exercise prescription for aerobic and resistive training based on initial evaluation findings, risk stratification,  comorbidities and participant's personal goals.       Expected Outcomes Achievement of increased cardiorespiratory fitness and enhanced flexibility, muscular endurance and strength shown through measurements of functional capacity and personal statement of participant.       Increase Strength and Stamina Yes       Intervention Provide advice, education, support and counseling about physical activity/exercise needs.;Develop an individualized exercise prescription for aerobic and resistive training based on initial evaluation findings, risk stratification, comorbidities and participant's personal goals.       Expected Outcomes Achievement of increased cardiorespiratory fitness and enhanced flexibility, muscular endurance and strength shown through measurements of functional capacity and personal statement of participant.          Exercise Goals Re-Evaluation :     Exercise Goals Re-Evaluation    Row Name 03/04/17 1427 04/01/17 1717 04/27/17 1417         Exercise Goal Re-Evaluation   Exercise Goals Review Increase Physical Activity;Increase Strenth and Stamina Increase Physical Activity;Increase Strength and Stamina;Knowledge and understanding of Target Heart Rate Range (THRR);Able to understand and use rate of perceived exertion (RPE) scale Increase Physical Activity;Understanding of Exercise Prescription     Comments Pt states he is getting comfortable with exercise and tolerating increases well. He says he is building his own bike and plans to start riding it next week and he is doing some walking at home Pt is doing well with exercise at cardiac rehab. Pt started using the rower and MET level average is 4.8. Pt know his THRR and understands and uses the RPE scale. Pt walks at least 1 day in addition to cardiac rehab and plans to ride his  bike more weather permitting. Patient is making excellent progress with exercise and feel that all is going well with his exercise prescription.     Expected  Outcomes continue with exercise Rx, home exercise program and gradually incresae workloads as tolerated in order to increase cardiorespiratory fitness.  Increase workloads as tolerated and incorporate biking back into home exercise program. Continue home exercise prescription and exercise at cardiac rehab to maintain health and fitness benefits.         Discharge Exercise Prescription (Final Exercise Prescription Changes):     Exercise Prescription Changes - 04/25/17 1700      Response to Exercise   Blood Pressure (Admit) 110/82   Blood Pressure (Exercise) 158/82   Blood Pressure (Exit) 122/82   Heart Rate (Admit) 85 bpm   Heart Rate (Exercise) 115 bpm   Heart Rate (Exit) 83 bpm   Rating of Perceived Exertion (Exercise) 12   Symptoms none   Comments --   Duration Progress to 45 minutes of aerobic exercise without signs/symptoms of physical distress   Intensity THRR unchanged     Progression   Progression Continue to progress workloads to maintain intensity without signs/symptoms of physical distress.   Average METs 5.2     Resistance Training   Training Prescription Yes   Weight 5lbs   Reps 10-15   Time 10 Minutes     Interval Training   Interval Training No     Treadmill   MPH 3.2   Grade 4   Minutes 15   METs 5.21     NuStep   Level 7   SPM 85   Minutes 10   METs 4.8     Rower   Level 7   Watts 49   Minutes 10   METs 5.55     Home Exercise Plan   Plans to continue exercise at Home (comment)   Frequency Add 4 additional days to program exercise sessions.   Initial Home Exercises Provided 03/04/17      Nutrition:  Target Goals: Understanding of nutrition guidelines, daily intake of sodium <1582m, cholesterol <2030m calories 30% from fat and 7% or less from saturated fats, daily to have 5 or more servings of fruits and vegetables.  Biometrics:     Pre Biometrics - 02/10/17 1423      Pre Biometrics   Height 5' 10.5" (1.791 m)   Weight 179 lb 3.7  oz (81.3 kg)   Waist Circumference 35 inches   Hip Circumference 40 inches   Waist to Hip Ratio 0.88 %   BMI (Calculated) 25.4   Triceps Skinfold 13 mm   % Body Fat 22.7 %   Grip Strength 44 kg   Flexibility 13 in   Single Leg Stand 30 seconds         Post Biometrics - 04/27/17 1524       Post  Biometrics   Waist Circumference 35.25 inches   Hip Circumference 39.25 inches   Waist to Hip Ratio 0.9 %   Triceps Skinfold 8 mm   Grip Strength 52.5 kg   Flexibility 15 in   Single Leg Stand 30 seconds      Nutrition Therapy Plan and Nutrition Goals:     Nutrition Therapy & Goals - 02/10/17 1417      Nutrition Therapy   Diet Therapeutic Lifestyle Changes     Personal Nutrition Goals   Nutrition Goal Wt loss of 1-2 lb/week to a wt loss goal of 6-10 lb at  graduation from [place], educate and counsel regarding individualized specific dietary modifications aiming towards targeted core components such as weight, hypertension, lipid management, diabetes, heart failure and other comorbidities.   Expected Outcomes Short Term Goal: Understand basic principles of dietary content, such as calories, fat, sodium, cholesterol and nutrients.;Long Term Goal: Adherence to prescribed nutrition plan.      Nutrition Discharge: Nutrition Scores:     Nutrition Assessments - 04/27/17 1545      MEDFICTS Scores   Pre Score 6   Post Score 12   Score Difference 6      Nutrition Goals Re-Evaluation:   Nutrition Goals Re-Evaluation:   Nutrition Goals Discharge (Final Nutrition Goals Re-Evaluation):   Psychosocial: Target Goals: Acknowledge presence or absence of significant depression and/or stress, maximize coping skills, provide positive support system. Participant is able to verbalize types and ability to use techniques and skills needed for reducing stress and depression.  Initial Review & Psychosocial Screening:     Initial  Psych Review & Screening - 02/10/17 1422      Initial Review   Current issues with None Identified     Family Dynamics   Good Support System? Yes     Barriers   Psychosocial barriers to participate in program There are no identifiable barriers or psychosocial needs.      Quality of Life Scores:     Quality of Life - 04/27/17 1706      Quality of Life Scores   Health/Function Pre 21.2 %   Health/Function Post 26.8 %   Health/Function % Change 26.42 %   Socioeconomic Pre 21.36 %   Socioeconomic Post 29.14 %   Socioeconomic % Change  36.42 %   Psych/Spiritual Pre 20.57 %   Psych/Spiritual Post 27.43 %   Psych/Spiritual % Change 33.35 %   Family Pre 20 %   Family Post 30 %   Family % Change 50 %   GLOBAL Pre 20.98 %   GLOBAL Post 27.75 %   GLOBAL % Change 32.27 %      PHQ-9: Recent Review Flowsheet Data    Depression screen [facility] 2/9 02/16/2017   Decreased Interest 1   Down, Depressed, Hopeless 1   PHQ - 2 Score 2   Altered sleeping 1   Tired, decreased energy 1   Change in appetite 1   Feeling bad or failure about yourself  1   Trouble concentrating 0   Moving slowly or fidgety/restless 0   Suicidal thoughts 0   PHQ-9 Score 6   Difficult doing work/chores Somewhat difficult     Interpretation of Total Score  Total Score Depression Severity:  1-4 = Minimal depression, 5-9 = Mild depression, 10-14 = Moderate depression, 15-19 = Moderately severe depression, 20-27 = Severe depression   Psychosocial Evaluation and Intervention:     Psychosocial Evaluation - 02/16/17 1717      Psychosocial Evaluation & Interventions   Interventions Encouraged to exercise with the program and follow exercise prescription;Relaxation education;Stress management education   Comments pt exhibits health related anxiety symptoms.  pt encouraged to participate in program to reduce these symptoms. pt does have psychologist who he  has discussed his emotional concerns with.    Expected  Outcomes pt will demonstrate positive outlook with good coping skills.    Continue Psychosocial Services  Follow up required by staff      Psychosocial Re-Evaluation:     Psychosocial Re-Evaluation  [place] Name 03/10/17 1025 04/06/17 1128 05/03/17 1541         Psychosocial Re-Evaluation   Current issues with None Identified Current Stress Concerns None Identified     Comments  - -  steve reports that he sometimes feels anxious   -     Interventions Encouraged to attend Cardiac Rehabilitation for the exercise Encouraged to attend Cardiac Rehabilitation for the exercise;Stress management education Encouraged to attend Cardiac Rehabilitation for the exercise;Stress management education     Continue Psychosocial Services  No Follow up required No Follow up required No Follow up required     Comments  - -  [name] says he has had some anxiety since quitting smoking  -       Initial Review   Source of Stress Concerns  -  - None Identified        Psychosocial Discharge (Final Psychosocial Re-Evaluation):     Psychosocial Re-Evaluation - 05/03/17 1541      Psychosocial Re-Evaluation   Current issues with None Identified   Interventions Encouraged to attend Cardiac Rehabilitation for the exercise;Stress management education   Continue Psychosocial Services  No Follow up required     Initial Review   Source of Stress Concerns None Identified      Vocational Rehabilitation: Provide vocational rehab assistance to qualifying candidates.   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      Education: Education Goals: Education classes will be provided on a weekly basis, covering required topics. Participant will state  understanding/return demonstration of topics presented.  Learning Barriers/Preferences:     Learning Barriers/Preferences - 02/10/17 1420      Learning Barriers/Preferences   Learning Barriers None   Learning Preferences None      Education Topics: Count Your Pulse:  -Group instruction provided by verbal instruction, demonstration, patient participation and written materials to support subject.  Instructors address importance of being able to find your pulse and how to count your pulse when at home without a heart monitor.  Patients get hands on experience counting their pulse with staff help and individually.   CARDIAC REHAB PHASE II EXERCISE from 04/29/2017 in [place]  Date  04/01/17  Instruction Review Code  2- meets goals/outcomes      Heart Attack, Angina, and Risk Factor Modification:  -Group instruction provided by verbal instruction, video, and written materials to support subject.  Instructors address signs and symptoms of angina and heart attacks.    Also discuss risk factors for heart disease and how to make changes to improve heart health risk factors.   CARDIAC REHAB PHASE II EXERCISE from 04/29/2017 in [place]  Date  02/16/17  Instruction Review Code  2- meets goals/outcomes      Functional Fitness:  -Group instruction provided by verbal instruction, demonstration, patient participation, and written materials to support subject.  Instructors address safety measures for doing things around the house.  Discuss how to get up and down off the floor, how to pick things up properly, how to safely get out of a chair without assistance, and balance training.   CARDIAC REHAB PHASE II EXERCISE from 04/29/2017 in [place]  Date  03/11/17  Educator  EP  Instruction Review Code  2- meets goals/outcomes      Meditation and Mindfulness:  -Group instruction provided by verbal  instruction, patient participation, and written materials to support subject.  Instructor addresses importance of mindfulness and meditation practice to help reduce stress and improve awareness.  Instructor also leads participants through a meditation exercise.    Stretching for Flexibility and Mobility:  -Group instruction provided by verbal instruction, patient participation, and written materials to support subject.  Instructors lead participants through series of stretches that are designed to increase flexibility thus improving mobility.  These stretches are additional exercise for major muscle groups that are typically performed during regular warm up and cool down.   CARDIAC REHAB PHASE II EXERCISE from 04/29/2017 in [place]  Date  04/29/17  Instruction Review Code  2- meets goals/outcomes      Hands Only CPR:  -Group verbal, video, and participation provides a basic overview of AHA guidelines for community CPR. Role-play of emergencies allow participants the opportunity to practice calling for help and chest compression technique with discussion of AED use.   Hypertension: -Group verbal and written instruction that provides a basic overview of hypertension including the most recent diagnostic guidelines, risk factor reduction with self-care instructions and medication management.   CARDIAC REHAB PHASE II EXERCISE from 04/29/2017 in [place]  Date  03/04/17  Instruction Review Code  2- meets goals/outcomes       Nutrition I class: Heart Healthy Eating:  -Group instruction provided by PowerPoint slides, verbal discussion, and written materials to support subject matter. The instructor gives an explanation and review of the Therapeutic Lifestyle Changes diet recommendations, which includes a discussion on lipid goals, dietary fat, sodium, fiber, plant stanol/sterol esters, sugar, and the components of a well-balanced,  healthy diet.   CARDIAC REHAB PHASE II EXERCISE from 04/29/2017 in [place]  Date  04/27/17  Educator  RD  Instruction Review Code  Not applicable      Nutrition II class: Lifestyle Skills:  -Group instruction provided by PowerPoint slides, verbal discussion, and written materials to support subject matter. The instructor gives an explanation and review of label reading, grocery shopping for heart health, heart healthy recipe modifications, and ways to make healthier choices when eating out.   CARDIAC REHAB PHASE II EXERCISE from 04/29/2017 in [place]  Date  04/27/17  Educator  RD  Instruction Review Code  Not applicable      Diabetes Question & Answer:  -Group instruction provided by PowerPoint slides, verbal discussion, and written materials to support subject matter. The instructor gives an explanation and review of diabetes co-morbidities, pre- and post-prandial blood glucose goals, pre-exercise blood glucose goals, signs, symptoms, and treatment of hypoglycemia and hyperglycemia, and foot care basics.   Diabetes Blitz:  -Group instruction provided by PowerPoint slides, verbal discussion, and written materials to support subject matter. The instructor gives an explanation and review of the physiology behind type 1 and type 2 diabetes, diabetes medications and rational behind using different medications, pre- and post-prandial blood glucose recommendations and Hemoglobin A1c goals, diabetes diet, and exercise including blood glucose guidelines for exercising safely.    Portion Distortion:  -Group instruction provided by PowerPoint slides, verbal discussion, written materials, and food models to support subject matter. The instructor gives an explanation of serving size versus portion size, changes in portions sizes over the last  20 years, and what consists of a serving from each food group.   CARDIAC REHAB PHASE II  EXERCISE from 04/29/2017 in [place]  Date  03/09/17  Educator  RD  Instruction Review Code  2- meets goals/outcomes      Stress Management:  -Group instruction provided by verbal instruction, video, and written materials to support subject matter.  Instructors review role of stress in heart disease and how to cope with stress positively.     Exercising on Your Own:  -Group instruction provided by verbal instruction, power point, and written materials to support subject.  Instructors discuss benefits of exercise, components of exercise, frequency and intensity of exercise, and end points for exercise.  Also discuss use of nitroglycerin and activating EMS.  Review options of places to exercise outside of rehab.  Review guidelines for sex with heart disease.   CARDIAC REHAB PHASE II EXERCISE from 04/29/2017 in [place]  Date  03/30/17  Educator  EP  Instruction Review Code  2- meets goals/outcomes      Cardiac Drugs I:  -Group instruction provided by verbal instruction and written materials to support subject.  Instructor reviews cardiac drug classes: antiplatelets, anticoagulants, beta blockers, and statins.  Instructor discusses reasons, side effects, and lifestyle considerations for each drug class.   CARDIAC REHAB PHASE II EXERCISE from 04/29/2017 in [place]  Instruction Review Code  2- meets goals/outcomes      Cardiac Drugs II:  -Group instruction provided by verbal instruction and written materials to support subject.  Instructor reviews cardiac drug classes: angiotensin converting enzyme inhibitors (ACE-I), angiotensin II receptor blockers (ARBs), nitrates, and calcium channel blockers.  Instructor discusses reasons, side effects, and lifestyle considerations for each drug class.   Anatomy and Physiology of the Circulatory System:  Group verbal and written instruction and models  provide basic cardiac anatomy and physiology, with the coronary electrical and arterial systems. Review of: AMI, Angina, Valve disease, Heart Failure, Peripheral Artery Disease, Cardiac Arrhythmia, Pacemakers, and the ICD.   CARDIAC REHAB PHASE II EXERCISE from 04/29/2017 in [place]  Date  04/20/17  Instruction Review Code  2- meets goals/outcomes      Other Education:  -Group or individual verbal, written, or video instructions that support the educational goals of the cardiac rehab program.   Knowledge Questionnaire Score:     Knowledge Questionnaire Score - 04/27/17 1419      Knowledge Questionnaire Score   Pre Score 22/24   Post Score 28/28      Core Components/Risk Factors/Patient Goals at Admission:     Personal Goals and Risk Factors at Admission - 02/10/17 1418      Core Components/Risk Factors/Patient Goals on Admission   Tobacco Cessation Yes   Intervention Assist the participant in steps to quit. Provide individualized education and counseling about committing to Tobacco Cessation, relapse prevention, and pharmacological support that can be provided by physician.;Advice worker, assist with locating and accessing local/national Quit Smoking programs, and support quit date choice.   Expected Outcomes Short Term: Will demonstrate readiness to quit, by selecting a quit date.;Short Term: Will quit all tobacco product use, adhering to prevention of relapse plan.;Long Term: Complete abstinence from all tobacco products for at least 12 months from quit date.      Core Components/Risk Factors/Patient Goals Review:      Goals and Risk Factor Review    Row  Name 04/06/17 1132 05/03/17 1540 05/03/17 1546         Core Components/Risk Factors/Patient Goals Review   Personal Goals Review Tobacco Cessation Tobacco Cessation;Lipids Tobacco Cessation;Lipids     Review -  [name] continues to abstain from smoking [name] continues to  work on smoking cessation. [name] still uses Nicorette gum. Steve's vital signs have been stable at cardiac rehab [name] continues to work on smoking cessation. [name] still uses Nicorette gum. Steve's vital signs have been stable at cardiac rehab     Expected Outcomes [name] is using nicotene gum to help with smoking cessation  - [name] is using nicotene gum to help with smoking cessation        Core Components/Risk Factors/Patient Goals at Discharge (Final Review):      Goals and Risk Factor Review - 05/03/17 1546      Core Components/Risk Factors/Patient Goals Review   Personal Goals Review Tobacco Cessation;Lipids   Review [name] continues to work on smoking cessation. [name] still uses Nicorette gum. Steve's vital signs have been stable at cardiac rehab   Expected Outcomes [name] is using nicotene gum to help with smoking cessation      ITP Comments:     ITP Comments    Row Name 02/10/17 1111           ITP Comments Dr. Fransico Him, Medical Director          Comments: [name] is making expected progress toward personal goals after completing 33 sessions. Recommend continued exercise and life style modification education including  stress management and relaxation techniques to decrease cardiac risk profile.[name] plans to participate in the maintenance program when he graduates next week.Barnet Pall, RN,BSN 05/03/2017 3:49 PM

## 2017-05-04 ENCOUNTER — Encounter (HOSPITAL_COMMUNITY)
Admission: RE | Admit: 2017-05-04 | Discharge: 2017-05-04 | Disposition: A | Discharge status: Home / Self Care | Payer: BLUE CROSS/BLUE SHIELD | Source: Ambulatory Visit | Attending: Cardiovascular Disease | Admitting: Cardiovascular Disease

## 2017-05-04 ENCOUNTER — Encounter (HOSPITAL_COMMUNITY): Payer: BLUE CROSS/BLUE SHIELD

## 2017-05-04 DIAGNOSIS — Z955 Presence of coronary angioplasty implant and graft: Secondary | ICD-10-CM | POA: Diagnosis not present

## 2017-05-04 DIAGNOSIS — R748 Abnormal levels of other serum enzymes: Secondary | ICD-10-CM | POA: Diagnosis not present

## 2017-05-06 ENCOUNTER — Encounter (HOSPITAL_COMMUNITY): Payer: BLUE CROSS/BLUE SHIELD

## 2017-05-06 ENCOUNTER — Encounter (HOSPITAL_COMMUNITY)
Admission: RE | Admit: 2017-05-06 | Discharge: 2017-05-06 | Disposition: A | Discharge status: Home / Self Care | Payer: BLUE CROSS/BLUE SHIELD | Source: Ambulatory Visit | Attending: Cardiovascular Disease | Admitting: Cardiovascular Disease

## 2017-05-06 DIAGNOSIS — Z955 Presence of coronary angioplasty implant and graft: Secondary | ICD-10-CM | POA: Diagnosis not present

## 2017-05-06 DIAGNOSIS — R748 Abnormal levels of other serum enzymes: Secondary | ICD-10-CM | POA: Diagnosis not present

## 2017-05-09 ENCOUNTER — Encounter (HOSPITAL_COMMUNITY)
Admission: RE | Admit: 2017-05-09 | Discharge: 2017-05-09 | Disposition: A | Discharge status: Home / Self Care | Payer: BLUE CROSS/BLUE SHIELD | Source: Ambulatory Visit | Attending: Cardiovascular Disease | Admitting: Cardiovascular Disease

## 2017-05-09 ENCOUNTER — Encounter (HOSPITAL_COMMUNITY): Payer: BLUE CROSS/BLUE SHIELD

## 2017-05-09 VITALS — BP 132/80 | HR 87 | Ht 70.5 in | Wt 181.9 lb

## 2017-05-09 DIAGNOSIS — Z955 Presence of coronary angioplasty implant and graft: Secondary | ICD-10-CM

## 2017-05-09 DIAGNOSIS — R748 Abnormal levels of other serum enzymes: Secondary | ICD-10-CM | POA: Diagnosis not present

## 2017-05-10 NOTE — Progress Notes (Signed)
Discharge Progress Report  Patient Details  Name: Charles Montoya MRN: [id number] Date of Birth: Nov 24, 1970 Referring Provider:     CARDIAC REHAB PHASE II ORIENTATION from 02/10/2017 in [place]  Referring Provider  Grayland Jack MD       Number of Visits: 36  Reason for Discharge:  Patient independent in their exercise.  Smoking History:  History  Smoking Status  . Former Smoker  . Packs/day: 1.50  . Types: Cigarettes  . Quit date: 01/19/2017  Smokeless Tobacco  . Never Used    Comment: Using nicoderm gum    Diagnosis:  S/P drug eluting coronary stent placement 01/19/2017  ADL UCSD:   Initial Exercise Prescription:     Initial Exercise Prescription - 02/10/17 1100      Date of Initial Exercise RX and Referring Provider   Date 02/10/17   Referring Provider Nahser, Doren Custard MD     Treadmill   MPH 3.2   Grade 1   Minutes 10   METs 3.4     Bike   Level 1   Minutes 10   METs 3.2     NuStep   Level 3   SPM 85   Minutes 10   METs 3     Prescription Details   Frequency (times per week) 3   Duration Progress to 45 minutes of aerobic exercise without signs/symptoms of physical distress     Intensity   THRR 40-80% of Max Heartrate 70-141   Ratings of Perceived Exertion 11-13   Perceived Dyspnea 0-4     Progression   Progression Continue to progress workloads to maintain intensity without signs/symptoms of physical distress.     Resistance Training   Training Prescription Yes   Weight 3   Reps 10-15      Discharge Exercise Prescription (Final Exercise Prescription Changes):     Exercise Prescription Changes - 05/09/17 1600      Response to Exercise   Blood Pressure (Admit) 132/80   Blood Pressure (Exercise) 142/84   Blood Pressure (Exit) 120/84   Heart Rate (Admit) 87 bpm   Heart Rate (Exercise) 122 bpm   Heart Rate (Exit) 86 bpm   Rating of Perceived Exertion (Exercise) 12   Symptoms none   Duration  Progress to 45 minutes of aerobic exercise without signs/symptoms of physical distress   Intensity THRR unchanged     Progression   Progression Continue to progress workloads to maintain intensity without signs/symptoms of physical distress.   Average METs 5.5     Resistance Training   Training Prescription Yes   Weight 5lbs   Reps 10-15   Time 10 Minutes     Interval Training   Interval Training No     Treadmill   MPH 3.2   Grade 4   Minutes 15   METs 5.21     NuStep   Level 7   SPM 85   Minutes 10   METs 5.5     Rower   Level 7   Watts 60   Minutes 10   METs 5.9     Home Exercise Plan   Plans to continue exercise at Home (comment)   Frequency Add 4 additional days to program exercise sessions.   Initial Home Exercises Provided 03/04/17      Functional Capacity:     6 Minute Walk    Row Name 02/10/17 1112 04/27/17 1524       6 Minute Walk  Phase Initial Discharge    Distance 1636 feet 2000 feet    Distance % Change  - 22.25 %    Distance Feet Change  - 364 ft    Walk Time 6 minutes 6 minutes    # of Rest Breaks 0 0    MPH 3.1 3.79    METS 5 6.07    RPE 11 15    VO2 Peak 17.3 21.26    Symptoms No No    Resting HR 60 bpm 83 bpm    Resting BP 122/82 118/80    Max Ex. HR 87 bpm 112 bpm    Max Ex. BP 128/86 158/82    2 Minute Post BP 124/82  -       Psychological, QOL, Others - Outcomes: PHQ 2/9: Depression screen [facility] 2/9 05/10/2017 02/16/2017  Decreased Interest 0 1  Down, Depressed, Hopeless 0 1  PHQ - 2 Score 0 2  Altered sleeping - 1  Tired, decreased energy - 1  Change in appetite - 1  Feeling bad or failure about yourself  - 1  Trouble concentrating - 0  Moving slowly or fidgety/restless - 0  Suicidal thoughts - 0  PHQ-9 Score - 6  Difficult doing work/chores - Somewhat difficult    Quality of Life:     Quality of Life - 04/27/17 1706      Quality of Life Scores   Health/Function Pre 21.2 %   Health/Function Post 26.8 %    Health/Function % Change 26.42 %   Socioeconomic Pre 21.36 %   Socioeconomic Post 29.14 %   Socioeconomic % Change  36.42 %   Psych/Spiritual Pre 20.57 %   Psych/Spiritual Post 27.43 %   Psych/Spiritual % Change 33.35 %   Family Pre 20 %   Family Post 30 %   Family % Change 50 %   GLOBAL Pre 20.98 %   GLOBAL Post 27.75 %   GLOBAL % Change 32.27 %      Personal Goals: Goals established at orientation with interventions provided to work toward goal.     Personal Goals and Risk Factors at Admission - 02/10/17 1418      Core Components/Risk Factors/Patient Goals on Admission   Tobacco Cessation Yes   Intervention Assist the participant in steps to quit. Provide individualized education and counseling about committing to Tobacco Cessation, relapse prevention, and pharmacological support that can be provided by physician.;Advice worker, assist with locating and accessing local/national Quit Smoking programs, and support quit date choice.   Expected Outcomes Short Term: Will demonstrate readiness to quit, by selecting a quit date.;Short Term: Will quit all tobacco product use, adhering to prevention of relapse plan.;Long Term: Complete abstinence from all tobacco products for at least 12 months from quit date.       Personal Goals Discharge:     Goals and Risk Factor Review    Row Name 04/06/17 1132 05/03/17 1540 05/03/17 1546         Core Components/Risk Factors/Patient Goals Review   Personal Goals Review Tobacco Cessation Tobacco Cessation;Lipids Tobacco Cessation;Lipids     Review -  [name] continues to abstain from smoking [name] continues to work on smoking cessation. [name] still uses Nicorette gum. Steve's vital signs have been stable at cardiac rehab [name] continues to work on smoking cessation. [name] still uses Nicorette gum. Steve's vital signs have been stable at cardiac rehab     Expected Outcomes [name] is using nicotene gum to help with  smoking cessation   - [name] is using nicotene gum to help with smoking cessation        Exercise Goals and Review:     Exercise Goals    Row Name 02/10/17 1120             Exercise Goals   Increase Physical Activity Yes       Intervention Provide advice, education, support and counseling about physical activity/exercise needs.;Develop an individualized exercise prescription for aerobic and resistive training based on initial evaluation findings, risk stratification, comorbidities and participant's personal goals.       Expected Outcomes Achievement of increased cardiorespiratory fitness and enhanced flexibility, muscular endurance and strength shown through measurements of functional capacity and personal statement of participant.       Increase Strength and Stamina Yes       Intervention Provide advice, education, support and counseling about physical activity/exercise needs.;Develop an individualized exercise prescription for aerobic and resistive training based on initial evaluation findings, risk stratification, comorbidities and participant's personal goals.       Expected Outcomes Achievement of increased cardiorespiratory fitness and enhanced flexibility, muscular endurance and strength shown through measurements of functional capacity and personal statement of participant.          Nutrition & Weight - Outcomes:     Pre Biometrics - 02/10/17 1423      Pre Biometrics   Height 5' 10.5" (1.791 m)   Weight 179 lb 3.7 oz (81.3 kg)   Waist Circumference 35 inches   Hip Circumference 40 inches   Waist to Hip Ratio 0.88 %   BMI (Calculated) 25.4   Triceps Skinfold 13 mm   % Body Fat 22.7 %   Grip Strength 44 kg   Flexibility 13 in   Single Leg Stand 30 seconds         Post Biometrics - 05/09/17 1635       Post  Biometrics   Height 5' 10.5" (1.791 m)   Weight 181 lb 14.1 oz (82.5 kg)   Waist Circumference 35.25 inches   Hip Circumference 39.25 inches   Waist to Hip Ratio 0.9 %   BMI  (Calculated) 25.72   Triceps Skinfold 8 mm   % Body Fat 21 %   Grip Strength 52.5 kg   Flexibility 15 in   Single Leg Stand 30 seconds      Nutrition:     Nutrition Therapy & Goals - 02/10/17 1417      Nutrition Therapy   Diet Therapeutic Lifestyle Changes     Personal Nutrition Goals   Nutrition Goal Wt loss of 1-2 lb/week to a wt loss goal of 6-10 lb at graduation from [facility], educate and counsel regarding individualized specific dietary modifications aiming towards targeted core components such as weight, hypertension, lipid management, diabetes, heart failure and other comorbidities.   Expected Outcomes Short Term Goal: Understand basic principles of dietary content, such as calories, fat, sodium, cholesterol and nutrients.;Long Term Goal: Adherence to prescribed nutrition plan.      Nutrition Discharge:     Nutrition Assessments - 04/27/17 1545      MEDFICTS Scores   Pre Score 6   Post Score 12   Score Difference 6      Education Questionnaire Score:     Knowledge Questionnaire Score - 04/27/17 1419      Knowledge Questionnaire Score   Pre Score 22/24   Post Score  28/28      Goals reviewed with patient; copy given to patient. [name] graduated from cardiac rehab program today with completion of 36 exercise sessions in Phase II. Pt maintained good attendance and progressed nicely during his participation in rehab as evidenced by increased MET level. [name] increased his distance on his post exercise walk test. Medication list reconciled. Repeat  PHQ score- 0 .  Pt has made significant lifestyle changes and should be commended for his success. Pt feels he has achieved his goals during cardiac rehab.   Pt plans to continue exercise in cardiac maintenance program. [name] is using Nicotine Gum and is not smoking cigarettes. [name] says he does not feel as anxious as he did when he first started the program.Deuntae Kocsis  Venetia Maxon, RN,BSN 05/10/2017 4:31 PM

## 2017-05-11 ENCOUNTER — Telehealth: Payer: Self-pay | Admitting: Cardiovascular Disease

## 2017-05-11 ENCOUNTER — Encounter (HOSPITAL_COMMUNITY): Payer: BLUE CROSS/BLUE SHIELD

## 2017-05-11 NOTE — Telephone Encounter (Signed)
Pt c/o medication issue:  1. Name of Medication: crestor  2. How are you currently taking this medication (dosage and times per day)? 20mg  3x week  3. Are you having a reaction (difficulty breathing--STAT)? Yes, some days  4. What is your medication issue? He is extrem fatigued

## 2017-05-11 NOTE — Telephone Encounter (Signed)
Pt states fatigue has improved some since decreasing Crestor to 10mg  3x a week from QD.  Pt states he is still very fatigued though.  Says he eats dinner and wants to go straight to bed and then has a hard time getting up in the morning. Advised I will send message to Dr. Acie Fredrickson for review and advisement.

## 2017-05-12 NOTE — Telephone Encounter (Signed)
Intolerant to Crestor 10 mg 3 times a week I dont think he will be able to take enough Crestor to get his LDL to goal. Please refer to Lipid clinic for consideration for PCSK9 Inhibitor

## 2017-05-13 ENCOUNTER — Encounter (HOSPITAL_COMMUNITY): Payer: BLUE CROSS/BLUE SHIELD

## 2017-05-13 NOTE — Telephone Encounter (Signed)
Spoke with patient and reviewed Dr. Elmarie Shiley advice with him. I answered his questions about PCSK9 to the best of my ability and scheduled him with our pharmacy team on 11/7. He will stop Crestor to see if his symptoms resolve. He verbalized understanding and agreement with plan of care and thanked me for the call.

## 2017-05-16 ENCOUNTER — Encounter (HOSPITAL_COMMUNITY): Payer: BLUE CROSS/BLUE SHIELD

## 2017-05-17 ENCOUNTER — Ambulatory Visit: Payer: BLUE CROSS/BLUE SHIELD | Admitting: Cardiovascular Disease

## 2017-05-18 ENCOUNTER — Encounter (HOSPITAL_COMMUNITY): Payer: BLUE CROSS/BLUE SHIELD

## 2017-05-18 ENCOUNTER — Encounter (HOSPITAL_COMMUNITY): Payer: Self-pay

## 2017-05-18 ENCOUNTER — Encounter (HOSPITAL_COMMUNITY)
Admission: RE | Admit: 2017-05-18 | Discharge: 2017-05-18 | Disposition: A | Discharge status: Home / Self Care | Payer: Self-pay | Source: Ambulatory Visit | Attending: Cardiovascular Disease | Admitting: Cardiovascular Disease

## 2017-05-18 DIAGNOSIS — Z48812 Encounter for surgical aftercare following surgery on the circulatory system: Secondary | ICD-10-CM | POA: Insufficient documentation

## 2017-05-18 DIAGNOSIS — R748 Abnormal levels of other serum enzymes: Secondary | ICD-10-CM | POA: Insufficient documentation

## 2017-05-18 DIAGNOSIS — Z955 Presence of coronary angioplasty implant and graft: Secondary | ICD-10-CM | POA: Insufficient documentation

## 2017-05-20 ENCOUNTER — Encounter (HOSPITAL_COMMUNITY): Payer: BLUE CROSS/BLUE SHIELD

## 2017-05-20 ENCOUNTER — Encounter (HOSPITAL_COMMUNITY): Payer: Self-pay

## 2017-05-20 ENCOUNTER — Encounter (HOSPITAL_COMMUNITY)
Admission: RE | Admit: 2017-05-20 | Discharge: 2017-05-20 | Disposition: A | Discharge status: Home / Self Care | Payer: Self-pay | Source: Ambulatory Visit | Attending: Cardiovascular Disease | Admitting: Cardiovascular Disease

## 2017-05-23 ENCOUNTER — Encounter (HOSPITAL_COMMUNITY)
Admission: RE | Admit: 2017-05-23 | Discharge: 2017-05-23 | Disposition: A | Discharge status: Home / Self Care | Payer: Self-pay | Source: Ambulatory Visit | Attending: Cardiovascular Disease | Admitting: Cardiovascular Disease

## 2017-05-23 ENCOUNTER — Encounter (HOSPITAL_COMMUNITY): Payer: Self-pay

## 2017-05-25 ENCOUNTER — Encounter (HOSPITAL_COMMUNITY): Payer: Self-pay

## 2017-05-25 ENCOUNTER — Encounter (HOSPITAL_COMMUNITY)
Admission: RE | Admit: 2017-05-25 | Discharge: 2017-05-25 | Disposition: A | Discharge status: Home / Self Care | Payer: Self-pay | Source: Ambulatory Visit | Attending: Cardiovascular Disease | Admitting: Cardiovascular Disease

## 2017-05-27 ENCOUNTER — Encounter (HOSPITAL_COMMUNITY)
Admission: RE | Admit: 2017-05-27 | Discharge: 2017-05-27 | Disposition: A | Discharge status: Home / Self Care | Payer: BLUE CROSS/BLUE SHIELD | Source: Ambulatory Visit | Attending: Cardiovascular Disease | Admitting: Cardiovascular Disease

## 2017-05-27 ENCOUNTER — Encounter (HOSPITAL_COMMUNITY): Payer: Self-pay

## 2017-05-27 DIAGNOSIS — R748 Abnormal levels of other serum enzymes: Secondary | ICD-10-CM | POA: Insufficient documentation

## 2017-05-27 DIAGNOSIS — Z955 Presence of coronary angioplasty implant and graft: Secondary | ICD-10-CM | POA: Insufficient documentation

## 2017-05-27 DIAGNOSIS — Z48812 Encounter for surgical aftercare following surgery on the circulatory system: Secondary | ICD-10-CM | POA: Insufficient documentation

## 2017-05-30 ENCOUNTER — Encounter (HOSPITAL_COMMUNITY): Payer: Self-pay

## 2017-05-30 ENCOUNTER — Encounter (HOSPITAL_COMMUNITY)
Admission: RE | Admit: 2017-05-30 | Discharge: 2017-05-30 | Disposition: A | Discharge status: Home / Self Care | Payer: Self-pay | Source: Ambulatory Visit | Attending: Cardiovascular Disease | Admitting: Cardiovascular Disease

## 2017-05-31 NOTE — Progress Notes (Signed)
Patient ID: Charles Montoya                 DOB: March 10, 1971                    MRN: [id number]     HPI: Charles Montoya is a 46 y.o. male patient referred to lipid clinic by Dr. Acie Fredrickson. PMH is significant for CAD (s/p stent in 01/19/17) and HLD. He has a history of multiple statin intolerances and presents today for further management.  Pt presents today in good spirits to discuss his options for his cholesterol. Reports fatigue is improved since stopping Crestor ~3 weeks ago. He reports that he noticed the fatigue within a week of starting the Crestor and within a week of starting the higher dose of Lipitor. He states that he did fine on Lipitor 20mg  daily previously and felt that he tolerated Lipitor better than Crestor. He has not tried any other statins or Zetia in the past.   Current Medications: No current medications for cholesterol due to recent discontinuation of Crestor Intolerances: atorvastatin 20mg  daily, 20mg  QOD (felt OK on 20mg  doses), 40mg  daily (fatigue), rosuvastatin 10mg  daily, 10mg  three times weekly (fatigue, may have impaired memory, "didn't feel right") Risk Factors: CAD, family history LDL goal: < 70 mg/dL  Diet:  Breakfast: green smoothie (banana, mango, coconut water, kale, whey protein) and decaf coffee with a splash of milk (states this helps with his SOB from [place]) Lunch: grilled chicken salad with balsamic vinaigrette or chicken sub from [employer]: Zoe's hummus and salad plate with salmon skewer and falafel from [place] [employer] Weakness was ice cream, but he hasn't had any since all this started in June No fried foods or fast foods Drinks: unsweet tea or half and half, occasionally a [employer] root beer  Exercise: Maintenance at cardiac rehab three times weekly and has been increasing intensity of workouts. Tries to work out in between rehab visits.  Family History: CAD in father. HTN in mother.  Social History: Former smoker 1.5-2 PPD, quit 01/19/17. Still  using nicorette gum. No alcohol or illicit drug use.  Previous Lipid Panels:  04/18/17 TC 145, TG 113, HDL 36, LDL 86 - patient was taking Lipitor 20mg  every other day and was  switched to Crestor 10mg  daily after visit 03/01/17 TC 165, TG 177, HDL 35, LDL 95 - patient was taking Lipitor 40mg  daily, then switched to 20mg  daily after visit 01/19/17 TC 219, TG 241, HDL 33, LDL 138 - prior to starting Lipitor 20mg  daily  Past Medical History:  Diagnosis Date  . Anxiety   . CAD (coronary artery disease)    a. cath 12/2016--> s/p DES to RPDA, 50% dRCA, 25% pLAD, 40% mLAD, 25% pro to mid CX  . Tobacco abuse     Current Outpatient Medications on File Prior to Visit  Medication Sig Dispense Refill  . ALPRAZolam (XANAX) 0.5 MG tablet Take 0.5 mg by mouth 3 (three) times daily as needed for anxiety. Anxiety     . aspirin 81 MG chewable tablet Chew 1 tablet (81 mg total) by mouth daily. 30 tablet 11  . clomiPRAMINE (ANAFRANIL) 25 MG capsule Take 25 mg by mouth at bedtime.    . nicotine polacrilex (NICORETTE) 4 MG gum Take 4 mg by mouth as needed for smoking cessation.    . nitroGLYCERIN (NITROSTAT) 0.4 MG SL tablet Place 1 tablet (0.4 mg total) under the tongue every 5 (five) minutes as needed for  chest pain. 25 tablet 3  . ticagrelor (BRILINTA) 90 MG TABS tablet Take 1 tablet (90 mg total) by mouth 2 (two) times daily. 60 tablet 11   No current facility-administered medications on file prior to visit.     Allergies  Allergen Reactions  . Codeine Itching    Assessment/Plan:  1. Hyperlipidemia - Given recent history of cardiac stenting, patient needs continued LDL reduction to achieve goal of < 70 mg/dL. Patient would like to try atorvastatin 10mg  daily and slowly self-titrate up to 20mg  daily since he previously tolerated this. Could consider adding Zetia in the future if LDL remains elevated. Pt would like advanced lipid panel testing and will come in tomorrow AM for fasting labs. Will follow up  with pt via telephone in 1 month to assess Lipitor tolerability.  Pt seen in clinic with Levonne Lapping, P4 pharmacy student.  Heloise Gordan E. Ardene Remley, PharmD, CPP, [name] [address], [place], [place] [address] Phone: [phone]; Fax: [phone] 06/01/2017 1:18 PM

## 2017-06-01 ENCOUNTER — Encounter (HOSPITAL_COMMUNITY)
Admission: RE | Admit: 2017-06-01 | Discharge: 2017-06-01 | Disposition: A | Discharge status: Home / Self Care | Payer: Self-pay | Source: Ambulatory Visit | Attending: Cardiovascular Disease | Admitting: Cardiovascular Disease

## 2017-06-01 ENCOUNTER — Ambulatory Visit (INDEPENDENT_AMBULATORY_CARE_PROVIDER_SITE_OTHER): Payer: BLUE CROSS/BLUE SHIELD | Admitting: Pharmacist

## 2017-06-01 ENCOUNTER — Telehealth: Payer: Self-pay | Admitting: Cardiovascular Disease

## 2017-06-01 ENCOUNTER — Encounter (HOSPITAL_COMMUNITY): Payer: Self-pay

## 2017-06-01 DIAGNOSIS — E782 Mixed hyperlipidemia: Secondary | ICD-10-CM | POA: Diagnosis not present

## 2017-06-01 MED ORDER — ATORVASTATIN CALCIUM 10 MG PO TABS: 10.0000 mg | ORAL_TABLET | Freq: Every day | ORAL | 11 refills | Status: DC

## 2017-06-01 NOTE — Telephone Encounter (Signed)
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 He verbalized understanding and agreement and thanked me for the call.

## 2017-06-01 NOTE — Patient Instructions (Signed)
It was great to see you today.  Your goal LDL is < 70 and your last reading in September was 86 while on Lipitor.  1. START atorvastatin (Lipitor) 10mg  daily and increase as tolerated up to 20mg  daily  2. Return to clinic tomorrow to get a fasting lipid panel, we will call with results when they come in  3. Will follow up with a phone call in 4 weeks or you can call us before then at [phone]  Keep up the good work with your diet and exercise!

## 2017-06-01 NOTE — Telephone Encounter (Signed)
Patient is scheduled to have his teeth cleaned in the near future and forgot to ask Fuller Canada, Pharm D, in his Lipid appointment today if he needed to change anything for his medication.  Patient specifically requested that [facility] call him.

## 2017-06-02 ENCOUNTER — Other Ambulatory Visit: Payer: BLUE CROSS/BLUE SHIELD | Admitting: *Deleted

## 2017-06-02 DIAGNOSIS — E782 Mixed hyperlipidemia: Secondary | ICD-10-CM | POA: Diagnosis not present

## 2017-06-03 ENCOUNTER — Encounter (HOSPITAL_COMMUNITY): Payer: Self-pay

## 2017-06-03 ENCOUNTER — Encounter (HOSPITAL_COMMUNITY)
Admission: RE | Admit: 2017-06-03 | Discharge: 2017-06-03 | Disposition: A | Discharge status: Home / Self Care | Payer: Self-pay | Source: Ambulatory Visit | Attending: Cardiovascular Disease | Admitting: Cardiovascular Disease

## 2017-06-03 LAB — NMR, LIPOPROFILE
Cholesterol: 253 mg/dL — ABNORMAL HIGH (ref 100–199)
HDL CHOLESTEROL BY NMR: 36 mg/dL — AB (ref >39)
HDL PARTICLE NUMBER: 27.1 umol/L — AB (ref >=30.5)
LDL Particle Number: 2334 nmol/L — ABNORMAL HIGH (ref <1000)
LDL SIZE: 20.1 nm (ref >20.5)
LDL-C: 180 mg/dL — ABNORMAL HIGH (ref 0–99)
LP-IR Score: 80 — ABNORMAL HIGH (ref <=45)
SMALL LDL PARTICLE NUMBER: 1311 nmol/L — AB (ref <=527)
Triglycerides by NMR: 186 mg/dL — ABNORMAL HIGH (ref 0–149)

## 2017-06-03 LAB — APOLIPOPROTEIN B: APOLIPOPROTEIN B: 155 mg/dL — AB (ref 52–135)

## 2017-06-06 ENCOUNTER — Encounter (HOSPITAL_COMMUNITY): Payer: Self-pay

## 2017-06-06 ENCOUNTER — Encounter (HOSPITAL_COMMUNITY)
Admission: RE | Admit: 2017-06-06 | Discharge: 2017-06-06 | Disposition: A | Discharge status: Home / Self Care | Payer: Self-pay | Source: Ambulatory Visit | Attending: Cardiovascular Disease | Admitting: Cardiovascular Disease

## 2017-06-06 ENCOUNTER — Telehealth: Payer: Self-pay | Admitting: Cardiovascular Disease

## 2017-06-06 DIAGNOSIS — E782 Mixed hyperlipidemia: Secondary | ICD-10-CM

## 2017-06-06 NOTE — Telephone Encounter (Signed)
New message    Patient calling wants to talk with nurse regarding blood work seems to be a mistake.

## 2017-06-06 NOTE — Telephone Encounter (Signed)
Clinical manager Georgana Curio, RN reviewed the lab schedule and the patient's concern and advised that patient may return for repeat NMR testing to verify the results. Of note, there were no other patient who received NMR screening on 11/8, the same day this patient came in. I called patient and reviewed this information and he would like to come in tomorrow for fasting labs. I have scheduled the appointment and notified Georgana Curio.

## 2017-06-06 NOTE — Telephone Encounter (Signed)
Spoke with patient who states he thinks that the NMR results that he received from blood work done last week seem very different from his last lipid profile and he thinks they were mixed up at the lab. He states his highest total cholesterol ever was approximately  219 mg/dL and that was prior to making healthy dietary changes and exercising. He is concerned that his blood sample was switched with another patient's. I advised that I will investigate further and will call him back. I advised that we will not make any changes in therapy until we verify his correct lab values, which could possibly require another blood draw. He states he is fine with getting his lab work retested. I advised him that I will call him back once I have more information. He thanked me for the call.

## 2017-06-07 ENCOUNTER — Other Ambulatory Visit: Payer: BLUE CROSS/BLUE SHIELD

## 2017-06-07 DIAGNOSIS — E782 Mixed hyperlipidemia: Secondary | ICD-10-CM

## 2017-06-07 LAB — NMR LIPOPROF + GRAPH
Cholesterol: 185 mg/dL (ref 100–199)
HDL CHOLESTEROL BY NMR: 34 mg/dL — AB (ref >39)
HDL PARTICLE NUMBER: 28 umol/L — AB (ref >=30.5)
LDL PARTICLE NUMBER: 1572 nmol/L — AB (ref <1000)
LDL Size: 20.3 nm (ref >20.5)
LDL-C: 118 mg/dL — AB (ref 0–99)
LP-IR SCORE: 56 — AB (ref <=45)
Small LDL Particle Number: 884 nmol/L — ABNORMAL HIGH (ref <=527)
Triglycerides by NMR: 163 mg/dL — ABNORMAL HIGH (ref 0–149)

## 2017-06-07 LAB — APOLIPOPROTEIN B: APOLIPOPROTEIN B: 123 mg/dL (ref 52–135)

## 2017-06-08 ENCOUNTER — Encounter (HOSPITAL_COMMUNITY)
Admission: RE | Admit: 2017-06-08 | Discharge: 2017-06-08 | Disposition: A | Discharge status: Home / Self Care | Payer: Self-pay | Source: Ambulatory Visit | Attending: Cardiovascular Disease | Admitting: Cardiovascular Disease

## 2017-06-08 ENCOUNTER — Encounter (HOSPITAL_COMMUNITY): Payer: Self-pay

## 2017-06-10 ENCOUNTER — Encounter (HOSPITAL_COMMUNITY)
Admission: RE | Admit: 2017-06-10 | Discharge: 2017-06-10 | Disposition: A | Discharge status: Home / Self Care | Payer: BLUE CROSS/BLUE SHIELD | Source: Ambulatory Visit | Attending: Cardiovascular Disease | Admitting: Cardiovascular Disease

## 2017-06-10 ENCOUNTER — Encounter (HOSPITAL_COMMUNITY): Payer: Self-pay

## 2017-06-13 ENCOUNTER — Encounter (HOSPITAL_COMMUNITY)
Admission: RE | Admit: 2017-06-13 | Discharge: 2017-06-13 | Disposition: A | Discharge status: Home / Self Care | Payer: Self-pay | Source: Ambulatory Visit | Attending: Cardiovascular Disease | Admitting: Cardiovascular Disease

## 2017-06-13 ENCOUNTER — Encounter (HOSPITAL_COMMUNITY): Payer: Self-pay

## 2017-06-15 ENCOUNTER — Encounter (HOSPITAL_COMMUNITY): Payer: Self-pay

## 2017-06-15 ENCOUNTER — Encounter (HOSPITAL_COMMUNITY)
Admission: RE | Admit: 2017-06-15 | Discharge: 2017-06-15 | Disposition: A | Discharge status: Home / Self Care | Payer: Self-pay | Source: Ambulatory Visit | Attending: Cardiovascular Disease | Admitting: Cardiovascular Disease

## 2017-06-20 ENCOUNTER — Encounter (HOSPITAL_COMMUNITY): Payer: Self-pay

## 2017-06-22 ENCOUNTER — Encounter (HOSPITAL_COMMUNITY): Payer: Self-pay

## 2017-06-23 DIAGNOSIS — L409 Psoriasis, unspecified: Secondary | ICD-10-CM | POA: Diagnosis not present

## 2017-06-23 DIAGNOSIS — R238 Other skin changes: Secondary | ICD-10-CM | POA: Diagnosis not present

## 2017-06-24 ENCOUNTER — Encounter (HOSPITAL_COMMUNITY): Payer: Self-pay

## 2017-06-27 ENCOUNTER — Encounter (HOSPITAL_COMMUNITY): Payer: Self-pay

## 2017-06-27 ENCOUNTER — Encounter (HOSPITAL_COMMUNITY)
Admission: RE | Admit: 2017-06-27 | Discharge: 2017-06-27 | Disposition: A | Discharge status: Home / Self Care | Payer: Self-pay | Source: Ambulatory Visit | Attending: Cardiovascular Disease | Admitting: Cardiovascular Disease

## 2017-06-27 DIAGNOSIS — Z48812 Encounter for surgical aftercare following surgery on the circulatory system: Secondary | ICD-10-CM | POA: Insufficient documentation

## 2017-06-27 DIAGNOSIS — Z955 Presence of coronary angioplasty implant and graft: Secondary | ICD-10-CM | POA: Insufficient documentation

## 2017-06-27 DIAGNOSIS — R748 Abnormal levels of other serum enzymes: Secondary | ICD-10-CM | POA: Insufficient documentation

## 2017-06-29 ENCOUNTER — Encounter (HOSPITAL_COMMUNITY): Payer: Self-pay

## 2017-06-29 ENCOUNTER — Encounter (HOSPITAL_COMMUNITY)
Admission: RE | Admit: 2017-06-29 | Discharge: 2017-06-29 | Disposition: A | Discharge status: Home / Self Care | Payer: Self-pay | Source: Ambulatory Visit | Attending: Cardiovascular Disease | Admitting: Cardiovascular Disease

## 2017-07-01 ENCOUNTER — Encounter (HOSPITAL_COMMUNITY)
Admission: RE | Admit: 2017-07-01 | Discharge: 2017-07-01 | Disposition: A | Discharge status: Home / Self Care | Payer: BLUE CROSS/BLUE SHIELD | Source: Ambulatory Visit | Attending: Cardiovascular Disease | Admitting: Cardiovascular Disease

## 2017-07-01 ENCOUNTER — Encounter (HOSPITAL_COMMUNITY): Payer: Self-pay

## 2017-07-04 ENCOUNTER — Encounter (HOSPITAL_COMMUNITY): Payer: Self-pay

## 2017-07-06 ENCOUNTER — Encounter (HOSPITAL_COMMUNITY): Payer: Self-pay

## 2017-07-06 ENCOUNTER — Encounter (HOSPITAL_COMMUNITY)
Admission: RE | Admit: 2017-07-06 | Discharge: 2017-07-06 | Disposition: A | Discharge status: Home / Self Care | Payer: Self-pay | Source: Ambulatory Visit | Attending: Cardiovascular Disease | Admitting: Cardiovascular Disease

## 2017-07-06 ENCOUNTER — Telehealth: Payer: Self-pay | Admitting: Cardiovascular Disease

## 2017-07-06 ENCOUNTER — Ambulatory Visit (INDEPENDENT_AMBULATORY_CARE_PROVIDER_SITE_OTHER): Payer: BLUE CROSS/BLUE SHIELD

## 2017-07-06 DIAGNOSIS — R0602 Shortness of breath: Secondary | ICD-10-CM

## 2017-07-06 DIAGNOSIS — R002 Palpitations: Secondary | ICD-10-CM

## 2017-07-06 NOTE — Telephone Encounter (Signed)
Spoke with patient who states he is having a fluttering feeling occasionally and notices that it causes a strange breathing sensation like a gasp. States it occurred yesterday and lasted longer than in previous instances; states it concerned him because he felt strange during that time and had a difficult time catching his breath. He states he used to snore when he drank alcohol but does not know if he snores now because he sleeps alone. He states these episodes occur almost daily now. I discussed with Dr. Acie Fredrickson who is in the office today. Patient is in agreement to wear a 48 hour monitor. He will come in later today for a monitor appointment. He thanked me for the call.

## 2017-07-06 NOTE — Telephone Encounter (Signed)
Patient c/o Palpitations:  High priority if patient c/o lightheadedness, shortness of breath, or chest pain  How long have you had palpitations/irregular HR/ Afib? Are you having the symptoms now?  Yesterday, not right now  1) Are you currently experiencing lightheadedness, SOB or CP? sob  2) Do you have a history of afib (atrial fibrillation) or irregular heart rhythm? no  3) Have you checked your BP or HR? (document readings if available): 138/80  4) Are you experiencing any other symptoms? Sob

## 2017-07-08 ENCOUNTER — Encounter (HOSPITAL_COMMUNITY)
Admission: RE | Admit: 2017-07-08 | Discharge: 2017-07-08 | Disposition: A | Discharge status: Home / Self Care | Payer: BLUE CROSS/BLUE SHIELD | Source: Ambulatory Visit | Attending: Cardiovascular Disease | Admitting: Cardiovascular Disease

## 2017-07-08 ENCOUNTER — Encounter (HOSPITAL_COMMUNITY): Payer: Self-pay

## 2017-07-11 ENCOUNTER — Encounter (HOSPITAL_COMMUNITY)
Admission: RE | Admit: 2017-07-11 | Discharge: 2017-07-11 | Disposition: A | Discharge status: Home / Self Care | Payer: Self-pay | Source: Ambulatory Visit | Attending: Cardiovascular Disease | Admitting: Cardiovascular Disease

## 2017-07-11 ENCOUNTER — Encounter (HOSPITAL_COMMUNITY): Payer: Self-pay

## 2017-07-11 DIAGNOSIS — M542 Cervicalgia: Secondary | ICD-10-CM | POA: Diagnosis not present

## 2017-07-11 DIAGNOSIS — M545 Low back pain: Secondary | ICD-10-CM | POA: Diagnosis not present

## 2017-07-13 ENCOUNTER — Encounter (HOSPITAL_COMMUNITY)
Admission: RE | Admit: 2017-07-13 | Discharge: 2017-07-13 | Disposition: A | Discharge status: Home / Self Care | Payer: BLUE CROSS/BLUE SHIELD | Source: Ambulatory Visit | Attending: Cardiovascular Disease | Admitting: Cardiovascular Disease

## 2017-07-13 ENCOUNTER — Encounter (HOSPITAL_COMMUNITY): Payer: Self-pay

## 2017-07-15 ENCOUNTER — Encounter (HOSPITAL_COMMUNITY)
Admission: RE | Admit: 2017-07-15 | Discharge: 2017-07-15 | Disposition: A | Discharge status: Home / Self Care | Payer: Self-pay | Source: Ambulatory Visit | Attending: Cardiovascular Disease | Admitting: Cardiovascular Disease

## 2017-07-15 ENCOUNTER — Encounter (HOSPITAL_COMMUNITY): Payer: Self-pay

## 2017-07-20 ENCOUNTER — Encounter (HOSPITAL_COMMUNITY): Payer: Self-pay

## 2017-07-20 ENCOUNTER — Encounter (HOSPITAL_COMMUNITY)
Admission: RE | Admit: 2017-07-20 | Discharge: 2017-07-20 | Disposition: A | Discharge status: Home / Self Care | Payer: BLUE CROSS/BLUE SHIELD | Source: Ambulatory Visit | Attending: Cardiovascular Disease | Admitting: Cardiovascular Disease

## 2017-07-22 ENCOUNTER — Encounter (HOSPITAL_COMMUNITY)
Admission: RE | Admit: 2017-07-22 | Discharge: 2017-07-22 | Disposition: A | Discharge status: Home / Self Care | Payer: Self-pay | Source: Ambulatory Visit | Attending: Cardiovascular Disease | Admitting: Cardiovascular Disease

## 2017-07-22 ENCOUNTER — Encounter (HOSPITAL_COMMUNITY): Payer: Self-pay

## 2017-07-25 ENCOUNTER — Encounter (HOSPITAL_COMMUNITY): Payer: Self-pay

## 2017-07-27 ENCOUNTER — Encounter (HOSPITAL_COMMUNITY)
Admission: RE | Admit: 2017-07-27 | Discharge: 2017-07-27 | Disposition: A | Discharge status: Home / Self Care | Payer: Self-pay | Source: Ambulatory Visit | Attending: Cardiovascular Disease | Admitting: Cardiovascular Disease

## 2017-07-27 ENCOUNTER — Encounter (HOSPITAL_COMMUNITY): Payer: Self-pay

## 2017-07-27 DIAGNOSIS — Z955 Presence of coronary angioplasty implant and graft: Secondary | ICD-10-CM | POA: Insufficient documentation

## 2017-07-27 DIAGNOSIS — Z48812 Encounter for surgical aftercare following surgery on the circulatory system: Secondary | ICD-10-CM | POA: Insufficient documentation

## 2017-07-27 DIAGNOSIS — R748 Abnormal levels of other serum enzymes: Secondary | ICD-10-CM | POA: Insufficient documentation

## 2017-07-28 ENCOUNTER — Other Ambulatory Visit: Payer: BLUE CROSS/BLUE SHIELD

## 2017-07-29 ENCOUNTER — Encounter (HOSPITAL_COMMUNITY)
Admission: RE | Admit: 2017-07-29 | Discharge: 2017-07-29 | Disposition: A | Discharge status: Home / Self Care | Payer: Self-pay | Source: Ambulatory Visit | Attending: Cardiovascular Disease | Admitting: Cardiovascular Disease

## 2017-07-29 ENCOUNTER — Encounter (HOSPITAL_COMMUNITY): Payer: Self-pay

## 2017-08-01 ENCOUNTER — Emergency Department (HOSPITAL_COMMUNITY)
Admission: EM | Admit: 2017-08-01 | Discharge: 2017-08-01 | Disposition: A | Discharge status: Home / Self Care | Payer: BLUE CROSS/BLUE SHIELD | Attending: Emergency Medicine | Admitting: Emergency Medicine

## 2017-08-01 ENCOUNTER — Emergency Department (HOSPITAL_COMMUNITY): Payer: BLUE CROSS/BLUE SHIELD

## 2017-08-01 ENCOUNTER — Other Ambulatory Visit: Payer: Self-pay

## 2017-08-01 ENCOUNTER — Encounter (HOSPITAL_COMMUNITY): Payer: Self-pay

## 2017-08-01 DIAGNOSIS — M792 Neuralgia and neuritis, unspecified: Secondary | ICD-10-CM | POA: Diagnosis not present

## 2017-08-01 DIAGNOSIS — Z79899 Other long term (current) drug therapy: Secondary | ICD-10-CM | POA: Insufficient documentation

## 2017-08-01 DIAGNOSIS — Z87891 Personal history of nicotine dependence: Secondary | ICD-10-CM | POA: Diagnosis not present

## 2017-08-01 DIAGNOSIS — M62830 Muscle spasm of back: Secondary | ICD-10-CM | POA: Diagnosis not present

## 2017-08-01 DIAGNOSIS — I2510 Atherosclerotic heart disease of native coronary artery without angina pectoris: Secondary | ICD-10-CM | POA: Diagnosis not present

## 2017-08-01 DIAGNOSIS — Z955 Presence of coronary angioplasty implant and graft: Secondary | ICD-10-CM | POA: Diagnosis not present

## 2017-08-01 DIAGNOSIS — Z7984 Long term (current) use of oral hypoglycemic drugs: Secondary | ICD-10-CM | POA: Diagnosis not present

## 2017-08-01 DIAGNOSIS — Z7982 Long term (current) use of aspirin: Secondary | ICD-10-CM | POA: Insufficient documentation

## 2017-08-01 DIAGNOSIS — M549 Dorsalgia, unspecified: Secondary | ICD-10-CM | POA: Diagnosis not present

## 2017-08-01 LAB — BASIC METABOLIC PANEL
ANION GAP: 6 (ref 5–15)
BUN: 17 mg/dL (ref 6–20)
CHLORIDE: 105 mmol/L (ref 101–111)
CO2: 26 mmol/L (ref 22–32)
Calcium: 9.6 mg/dL (ref 8.9–10.3)
Creatinine, Ser: 1.05 mg/dL (ref 0.61–1.24)
GFR calc Af Amer: >60 mL/min (ref >60)
GLUCOSE: 105 mg/dL — AB (ref 65–99)
POTASSIUM: 4.8 mmol/L (ref 3.5–5.1)
Sodium: 137 mmol/L (ref 135–145)

## 2017-08-01 LAB — CBC
HEMATOCRIT: 41.2 % (ref 39.0–52.0)
HEMOGLOBIN: 14.1 g/dL (ref 13.0–17.0)
MCH: 29.7 pg (ref 26.0–34.0)
MCHC: 34.2 g/dL (ref 30.0–36.0)
MCV: 86.9 fL (ref 78.0–100.0)
Platelets: 222 10*3/uL (ref 150–400)
RBC: 4.74 MIL/uL (ref 4.22–5.81)
RDW: 12.8 % (ref 11.5–15.5)
WBC: 5.7 10*3/uL (ref 4.0–10.5)

## 2017-08-01 LAB — I-STAT TROPONIN, ED: Troponin i, poc: 0 ng/mL (ref 0.00–0.08)

## 2017-08-01 MED ORDER — CYCLOBENZAPRINE HCL 10 MG PO TABS: 10.0000 mg | ORAL_TABLET | Freq: Two times a day (BID) | ORAL | 0 refills | Status: DC | PRN

## 2017-08-01 NOTE — ED Triage Notes (Signed)
Pt reports pain to left upper back that radiates into left shoulder. Endorses numb sensation to shoulder. Pt reports he noticed the pain this morning when he woke up and decided to get evaluated d./t cardiac hx. Denies chest pain, n/v, sob, diaphoresis.

## 2017-08-01 NOTE — ED Provider Notes (Signed)
[facility] EMERGENCY DEPARTMENT Provider Note   CSN: [id number] Arrival date & time: 08/01/17  1307     History   Chief Complaint Chief Complaint  Patient presents with  . Back Pain    radiates to left arm w/ cardiac hx    HPI Charles Montoya is a 47 y.o. male.  Patient presents to the ED with a chief complaint of constant, moderate upper back pain.  Patient states that he awoke with the pain.  He reports some associated tingling/numbness into his left arm.  He denies any chest pain, SOB, diaphoresis, or nausea.  He states that it feels like a muscle pain, but he wanted to be evaluated because he had an MI 6 months ago and had a stent placed.  The pain is reproducible with palpation. He has not taken anything to alleviate the symptoms.   The history is provided by the patient. No language interpreter was used.    Past Medical History:  Diagnosis Date  . Anxiety   . CAD (coronary artery disease)    a. cath 12/2016--> s/p DES to RPDA, 50% dRCA, 25% pLAD, 40% mLAD, 25% pro to mid CX  . Tobacco abuse     Patient Active Problem List   Diagnosis Date Noted  . Mixed hyperlipidemia 02/01/2017  . Cardiac enzymes elevated   . Coronary artery disease involving native coronary artery of native heart with unstable angina pectoris ([place])   . Chest pain 01/18/2017  . Tobacco abuse 01/18/2017    Past Surgical History:  Procedure Laterality Date  . CARDIAC CATHETERIZATION    . CORONARY STENT INTERVENTION N/A 01/19/2017   Procedure: Coronary Stent Intervention;  Surgeon: Jettie Booze, MD;  Location: [facility];  Service: Cardiovascular;  Laterality: N/A;  . HERNIA REPAIR    . LEFT HEART CATH AND CORONARY ANGIOGRAPHY N/A 01/19/2017   Procedure: Left Heart Cath and Coronary Angiography;  Surgeon: Jettie Booze, MD;  Location: [facility];  Service: Cardiovascular;  Laterality: N/A;       Home Medications    Prior to Admission medications    Medication Sig Start Date End Date Taking? Authorizing Provider  ALPRAZolam Duanne Moron) 0.5 MG tablet Take 0.5 mg by mouth 3 (three) times daily as needed for anxiety. Anxiety    Yes [provider]  aspirin 81 MG chewable tablet Chew 1 tablet (81 mg total) by mouth daily. 01/20/17  Yes Reyne Dumas, MD  atorvastatin (LIPITOR) 10 MG tablet Take 1 tablet (10 mg total) daily by mouth. Patient taking differently: Take 5 mg by mouth daily.  06/01/17 08/30/17 Yes Nahser, Wonda Cheng, MD  clomiPRAMINE (ANAFRANIL) 25 MG capsule Take 25 mg by mouth at bedtime.   Yes [provider]  nicotine polacrilex (NICORETTE) 4 MG gum Take 4 mg by mouth as needed for smoking cessation.   Yes [provider]  nitroGLYCERIN (NITROSTAT) 0.4 MG SL tablet Place 1 tablet (0.4 mg total) under the tongue every 5 (five) minutes as needed for chest pain. 01/21/17 08/01/17 Yes Jettie Booze, MD  ticagrelor (BRILINTA) 90 MG TABS tablet Take 1 tablet (90 mg total) by mouth 2 (two) times daily. 01/20/17  Yes Reyne Dumas, MD  cyclobenzaprine (FLEXERIL) 10 MG tablet Take 1 tablet (10 mg total) by mouth 2 (two) times daily as needed for muscle spasms. 08/01/17   Montine Circle, PA-C    Family History Family History  Problem Relation Age of Onset  .  CAD Father   . Hypertension Mother     Social History Social History   Tobacco Use  . Smoking status: Former Smoker    Packs/day: 1.50    Types: Cigarettes    Last attempt to quit: 01/19/2017    Years since quitting: 0.5  . Smokeless tobacco: Never Used  . Tobacco comment: Using nicoderm gum  Substance Use Topics  . Alcohol use: No  . Drug use: No     Allergies   Codeine   Review of Systems Review of Systems  All other systems reviewed and are negative.    Physical Exam Updated Vital Signs BP (!) 141/86 (BP Location: Right Arm)   Pulse 70   Temp 99 F (37.2 C)   Resp 18   SpO2 99%   Physical Exam  Constitutional: He is oriented to  person, place, and time. He appears well-developed and well-nourished.  HENT:  Head: Normocephalic and atraumatic.  Eyes: Conjunctivae and EOM are normal. Pupils are equal, round, and reactive to light. Right eye exhibits no discharge. Left eye exhibits no discharge. No scleral icterus.  Neck: Normal range of motion. Neck supple. No JVD present.  Cardiovascular: Normal rate, regular rhythm and normal heart sounds. Exam reveals no gallop and no friction rub.  No murmur heard. Pulmonary/Chest: Effort normal and breath sounds normal. No respiratory distress. He has no wheezes. He has no rales. He exhibits no tenderness.  Abdominal: Soft. He exhibits no distension and no mass. There is no tenderness. There is no rebound and no guarding.  Musculoskeletal: Normal range of motion. He exhibits no edema or tenderness.  Left sided rhomboids ttp  Neurological: He is alert and oriented to person, place, and time.  Skin: Skin is warm and dry.  Psychiatric: He has a normal mood and affect. His behavior is normal. Judgment and thought content normal.  Nursing note and vitals reviewed.    ED Treatments / Results  Labs (all labs ordered are listed, but only abnormal results are displayed) Labs Reviewed  BASIC METABOLIC PANEL - Abnormal; Notable for the following components:      Result Value   Glucose, Bld 105 (*)    All other components within normal limits  CBC  I-STAT TROPONIN, ED    EKG  EKG Interpretation  Date/Time:  Monday August 01 2017 13:24:02 EST Ventricular Rate:  79 PR Interval:  122 QRS Duration: 104 QT Interval:  372 QTC Calculation: 426 R Axis:   72 Text Interpretation:  Normal sinus rhythm Normal ECG No significant change since last tracing Confirmed by Wandra Arthurs [phone]) on 08/01/2017 6:40:07 PM       Radiology Dg Chest 2 View  Result Date: 08/01/2017 CLINICAL DATA:  Left arm and left-sided back pain for the past day. EXAM: CHEST  2 VIEW COMPARISON:  Chest x-ray dated  January 18, 2017. FINDINGS: The cardiomediastinal silhouette is normal in size. Normal pulmonary vascularity. No focal consolidation, pleural effusion, or pneumothorax. No acute osseous abnormality. IMPRESSION: No active cardiopulmonary disease. Electronically Signed   By: Titus Dubin M.D.   On: 08/01/2017 14:16    Procedures Procedures (including critical care time)  Medications Ordered in ED Medications - No data to display   Initial Impression / Assessment and Plan / ED Course  I have reviewed the triage vital signs and the nursing notes.  Pertinent labs & imaging results that were available during my care of the patient were reviewed by me and considered in my medical  decision making (see chart for details).     Patient with left sided upper back pain.  Radiates to left arm.  No CP, SOB, diaphoresis, or nausea.  He has not taken anything for the symptoms.  Troponin is negative.  EKG is normal.  HEART score is 2.  PERC negative.  Doubt PE.  Doubt ACS.  Will treat with flexeril and will discharge to home with PCP follow--up.  Final Clinical Impressions(s) / ED Diagnoses   Final diagnoses:  Muscle spasm of back  Neuralgia    ED Discharge Orders        Ordered    cyclobenzaprine (FLEXERIL) 10 MG tablet  2 times daily PRN     08/01/17 2013       Montine Circle, PA-C 08/01/17 2022    Drenda Freeze, MD 08/01/17 2356

## 2017-08-02 DIAGNOSIS — M5430 Sciatica, unspecified side: Secondary | ICD-10-CM | POA: Diagnosis not present

## 2017-08-02 DIAGNOSIS — M542 Cervicalgia: Secondary | ICD-10-CM | POA: Diagnosis not present

## 2017-08-03 ENCOUNTER — Encounter (HOSPITAL_COMMUNITY)
Admission: RE | Admit: 2017-08-03 | Discharge: 2017-08-03 | Disposition: A | Discharge status: Home / Self Care | Payer: Self-pay | Source: Ambulatory Visit | Attending: Cardiovascular Disease | Admitting: Cardiovascular Disease

## 2017-08-03 ENCOUNTER — Encounter (HOSPITAL_COMMUNITY): Payer: Self-pay

## 2017-08-05 ENCOUNTER — Encounter (HOSPITAL_COMMUNITY): Payer: Self-pay

## 2017-08-05 ENCOUNTER — Encounter (HOSPITAL_COMMUNITY)
Admission: RE | Admit: 2017-08-05 | Discharge: 2017-08-05 | Disposition: A | Discharge status: Home / Self Care | Payer: BLUE CROSS/BLUE SHIELD | Source: Ambulatory Visit | Attending: Cardiovascular Disease | Admitting: Cardiovascular Disease

## 2017-08-08 ENCOUNTER — Encounter (HOSPITAL_COMMUNITY)
Admission: RE | Admit: 2017-08-08 | Discharge: 2017-08-08 | Disposition: A | Discharge status: Home / Self Care | Payer: Self-pay | Source: Ambulatory Visit | Attending: Cardiovascular Disease | Admitting: Cardiovascular Disease

## 2017-08-08 ENCOUNTER — Encounter (HOSPITAL_COMMUNITY): Payer: Self-pay

## 2017-08-10 ENCOUNTER — Encounter (HOSPITAL_COMMUNITY)
Admission: RE | Admit: 2017-08-10 | Discharge: 2017-08-10 | Disposition: A | Discharge status: Home / Self Care | Payer: Self-pay | Source: Ambulatory Visit | Attending: Cardiovascular Disease | Admitting: Cardiovascular Disease

## 2017-08-10 ENCOUNTER — Encounter (HOSPITAL_COMMUNITY): Payer: Self-pay

## 2017-08-10 DIAGNOSIS — E7800 Pure hypercholesterolemia, unspecified: Secondary | ICD-10-CM | POA: Diagnosis not present

## 2017-08-10 DIAGNOSIS — F419 Anxiety disorder, unspecified: Secondary | ICD-10-CM | POA: Diagnosis not present

## 2017-08-10 DIAGNOSIS — L409 Psoriasis, unspecified: Secondary | ICD-10-CM | POA: Diagnosis not present

## 2017-08-10 DIAGNOSIS — I2510 Atherosclerotic heart disease of native coronary artery without angina pectoris: Secondary | ICD-10-CM | POA: Diagnosis not present

## 2017-08-10 DIAGNOSIS — L290 Pruritus ani: Secondary | ICD-10-CM | POA: Diagnosis not present

## 2017-08-10 DIAGNOSIS — Z0000 Encounter for general adult medical examination without abnormal findings: Secondary | ICD-10-CM | POA: Diagnosis not present

## 2017-08-12 ENCOUNTER — Telehealth: Payer: Self-pay | Admitting: Cardiovascular Disease

## 2017-08-12 ENCOUNTER — Encounter: Payer: Self-pay | Admitting: Cardiovascular Disease

## 2017-08-12 ENCOUNTER — Encounter (HOSPITAL_COMMUNITY)
Admission: RE | Admit: 2017-08-12 | Discharge: 2017-08-12 | Disposition: A | Discharge status: Home / Self Care | Payer: Self-pay | Source: Ambulatory Visit | Attending: Cardiovascular Disease | Admitting: Cardiovascular Disease

## 2017-08-12 ENCOUNTER — Encounter (HOSPITAL_COMMUNITY): Payer: Self-pay

## 2017-08-12 NOTE — Telephone Encounter (Signed)
°  New Prob   Patient has some questions regarding his Atorvastatin medication. Please call.

## 2017-08-12 NOTE — Telephone Encounter (Signed)
Patient wants to know if he could switch from taking Lipitor to "Beyond Red Yeast Rice" supplement . Patient is wanting to take a more natural approach. Patient stated he stopped taking his Lipitor 3 weeks ago. Patient stated he had recent lab work done with his PCP. Patient reported.  Total cholesterol 231 HDL 39 LDL 169 Requested a copy of lab work to be sent to our office. Patient stated he decided to take Lipitor last night and it made him feel terrible. Patient stated he had symptoms of mental fog and depression after taking Lipitor this one time. Told patient to hold medication for now and will send message to Dr. Acie Fredrickson for advisement. Patient is also going to cardiac rehab today, and he will ask the nutritionist about the red yeast rice supplement.

## 2017-08-14 NOTE — Telephone Encounter (Signed)
The red yeast rice is not a good option .   It's not strong enough Please refer to Lipid clinic for consideration for PCSK-9 inhibitor

## 2017-08-15 ENCOUNTER — Encounter (HOSPITAL_COMMUNITY): Payer: Self-pay

## 2017-08-15 ENCOUNTER — Encounter (HOSPITAL_COMMUNITY)
Admission: RE | Admit: 2017-08-15 | Discharge: 2017-08-15 | Disposition: A | Discharge status: Home / Self Care | Payer: BLUE CROSS/BLUE SHIELD | Source: Ambulatory Visit | Attending: Cardiovascular Disease | Admitting: Cardiovascular Disease

## 2017-08-17 ENCOUNTER — Encounter (HOSPITAL_COMMUNITY): Payer: Self-pay

## 2017-08-17 ENCOUNTER — Encounter (HOSPITAL_COMMUNITY)
Admission: RE | Admit: 2017-08-17 | Discharge: 2017-08-17 | Disposition: A | Discharge status: Home / Self Care | Payer: Self-pay | Source: Ambulatory Visit | Attending: Cardiovascular Disease | Admitting: Cardiovascular Disease

## 2017-08-17 NOTE — Telephone Encounter (Signed)
Left message for patient to call back  

## 2017-08-17 NOTE — Telephone Encounter (Signed)
Typically need ~6-8 week washout after stopping statin therapy to assess more accurate baseline. However, recent lipid panel within 3 weeks of stopping Lipitor shows LDL already far above goal < 70 at 169.  Agree that red yeast rice will not bring LDL near goal. Inflammation does not really cause cholesterol to be high, but it can be a contributing risk factor towards cardiovascular disease. Would recommend checking lipid panel (ok to order advanced NMR panel) and CRP in another 3-4 weeks if pt is wishing to look at inflammatory marker prior to discussing alternative lipid-lowering therapies. However, natural approach will not bring cholesterol to goal.

## 2017-08-17 NOTE — Telephone Encounter (Signed)
Spoke with patient who states he did not tolerate Lipitor and stopped taking it 3-4 weeks ago. He asks if he can get another particle number test to see if his numbers have improved. He states he would also like to get blood work to determine if inflammation is causing his cholesterol to be high. He states he would like to try a natural approach to treating his cholesterol because the statin medication made him feel so bad. I advised that I will forward message to Fuller [place], [place] to help with determining best treatment method. He asked about getting repeat lipid testing tomorrow and I advised that we will wait to hear back from Jinny Blossom so that his time and money are not wasted if testing would be more beneficial at a later time. He verbalized understanding and agreement with plan and thanked me for the call.

## 2017-08-18 NOTE — Telephone Encounter (Signed)
Spoke with patient and reviewed Megan Supple's advice with him. He verbalized understanding and is scheduled for an appointment with our pharamacist on 2/12 to discuss starting PCSK9 agent to lower cholesterol. I advised that timeline for lab work would be discussed at that appointment. He thanked me for the call.

## 2017-08-19 ENCOUNTER — Encounter (HOSPITAL_COMMUNITY): Payer: Self-pay

## 2017-08-19 ENCOUNTER — Encounter (HOSPITAL_COMMUNITY)
Admission: RE | Admit: 2017-08-19 | Discharge: 2017-08-19 | Disposition: A | Discharge status: Home / Self Care | Payer: BLUE CROSS/BLUE SHIELD | Source: Ambulatory Visit | Attending: Cardiovascular Disease | Admitting: Cardiovascular Disease

## 2017-08-19 ENCOUNTER — Telehealth: Payer: Self-pay | Admitting: Cardiovascular Disease

## 2017-08-22 ENCOUNTER — Encounter (HOSPITAL_COMMUNITY): Payer: Self-pay

## 2017-08-24 ENCOUNTER — Encounter (HOSPITAL_COMMUNITY)
Admission: RE | Admit: 2017-08-24 | Discharge: 2017-08-24 | Disposition: A | Discharge status: Home / Self Care | Payer: Self-pay | Source: Ambulatory Visit | Attending: Cardiovascular Disease | Admitting: Cardiovascular Disease

## 2017-08-24 ENCOUNTER — Encounter (HOSPITAL_COMMUNITY): Payer: Self-pay

## 2017-08-26 ENCOUNTER — Encounter (HOSPITAL_COMMUNITY): Payer: Self-pay

## 2017-08-26 ENCOUNTER — Encounter (HOSPITAL_COMMUNITY)
Admission: RE | Admit: 2017-08-26 | Discharge: 2017-08-26 | Disposition: A | Discharge status: Home / Self Care | Payer: Self-pay | Source: Ambulatory Visit | Attending: Cardiovascular Disease | Admitting: Cardiovascular Disease

## 2017-08-26 DIAGNOSIS — Z48812 Encounter for surgical aftercare following surgery on the circulatory system: Secondary | ICD-10-CM | POA: Insufficient documentation

## 2017-08-26 DIAGNOSIS — R748 Abnormal levels of other serum enzymes: Secondary | ICD-10-CM | POA: Insufficient documentation

## 2017-08-26 DIAGNOSIS — Z955 Presence of coronary angioplasty implant and graft: Secondary | ICD-10-CM | POA: Insufficient documentation

## 2017-08-29 ENCOUNTER — Encounter (HOSPITAL_COMMUNITY)
Admission: RE | Admit: 2017-08-29 | Discharge: 2017-08-29 | Disposition: A | Discharge status: Home / Self Care | Payer: Self-pay | Source: Ambulatory Visit | Attending: Cardiovascular Disease | Admitting: Cardiovascular Disease

## 2017-08-29 ENCOUNTER — Encounter (HOSPITAL_COMMUNITY): Payer: Self-pay

## 2017-08-30 NOTE — Progress Notes (Signed)
Patient ID: Charles Montoya                 DOB: 1971/03/17                    MRN: [id number]     HPI: Charles Montoya is a 47 y.o. male patient of Dr. Acie Fredrickson that presents today for lipid evaluation.  PMH includes CAD (s/p stent in 01/19/17) and HLD. He has a history of multiple statin intolerances and presents today for further management. He has previously been seen in lipid clinic and was started on lipitor 20mg  every other day. However, he called a few weeks ago to report that he had stopped his Lipitor and was looking for a more natural approach. He had a cholesterol panel drawn at primary care office and results revealed total cholesterol 231, HDL 39, and LDL 169. He would like to start red yeast rice. He was agreeable to appointment to discuss PCSK9i therapy and would also like to discuss inflammation's role with high cholesterol.   He presents today for discussion of options for cholesterol management. He states he can not recall what dose of Lipitor he was taking when the cholesterol panel was drawn in November. He requests another NMR panel be drawn today now that he is off the medication and has been working hard on his diet and exercise. We also discussed role of inflammation in cardiovascular disease and he would like a CRP drawn as well. He requests an A1c as well, but I will defer to Dr. Acie Fredrickson to order this.   He states that Lipitor caused him to be in a "fog" and to be forgetful. He states that anytime he takes statin medications he just doesn't feel right.    Risk Factors: CAD, family history LDL Goal: <70  Current Medications:  Intolerances: atorvastatin 20mg  daily (mental fog and depression), 20mg  QOD (mental fog), 40mg  daily (fatigue), rosuvastatin 10mg  daily, 10mg  three times weekly (fatigue, may have impaired memory, "didn't feel right")  Diet:  Breakfast: green smoothie (banana, mango, coconut water, kale, whey protein) and decaf coffee with a splash of milk (states this helps  with his SOB from [place]) Lunch: grilled chicken salad with balsamic vinaigrette or chicken sub from [employer]: Zoe[employer] hummus and salad plate with salmon skewer and falafel from [place] [employer] Weakness was ice cream, but he hasn't had any since all this started in June No fried foods or fast foods Drinks: unsweet tea or half and half, occasionally a [employer] root beer  Exercise: Maintenance at cardiac rehab three times weekly and has been increasing intensity of workouts. Tries to work out in between rehab visits.  Family History: CAD in father. HTN in mother.  Social History: Former smoker 1.5-2 PPD, quit 01/19/17. Still using nicorette gum. No alcohol or illicit drug use.  Labs: 06/07/17: LDL particle number: 0037, LDL-c 118, HDL 34, TG 163, TC 185, ApoB 123 - Lipitor 20mg  daily had been on hold about 3 weeks 04/18/17 TC 145, TG 113, HDL 36, LDL 86 - patient was taking Lipitor 20mg  every other day and was  switched to Crestor 10mg  daily after visit 03/01/17 TC 165, TG 177, HDL 35, LDL 95 - patient was taking Lipitor 40mg  daily, then switched to 20mg  daily after visit 01/19/17 TC 219, TG 241, HDL 33, LDL 138 - prior to starting Lipitor 20mg  daily  Past Medical History:  Diagnosis Date  . Anxiety   . CAD (coronary artery disease)  a. cath 12/2016--> s/p DES to RPDA, 50% dRCA, 25% pLAD, 40% mLAD, 25% pro to mid CX  . Tobacco abuse     Current Outpatient Medications on File Prior to Visit  Medication Sig Dispense Refill  . ALPRAZolam (XANAX) 0.5 MG tablet Take 0.5 mg by mouth 3 (three) times daily as needed for anxiety. Anxiety     . aspirin 81 MG chewable tablet Chew 1 tablet (81 mg total) by mouth daily. 30 tablet 11  . clomiPRAMINE (ANAFRANIL) 25 MG capsule Take 25 mg by mouth at bedtime.    . cyclobenzaprine (FLEXERIL) 10 MG tablet Take 1 tablet (10 mg total) by mouth 2 (two) times daily as needed for muscle spasms. 20 tablet 0  . nicotine polacrilex (NICORETTE) 4 MG gum Take  4 mg by mouth as needed for smoking cessation.    . nitroGLYCERIN (NITROSTAT) 0.4 MG SL tablet Place 1 tablet (0.4 mg total) under the tongue every 5 (five) minutes as needed for chest pain. 25 tablet 3  . ticagrelor (BRILINTA) 90 MG TABS tablet Take 1 tablet (90 mg total) by mouth 2 (two) times daily. 60 tablet 11   No current facility-administered medications on file prior to visit.     Allergies  Allergen Reactions  . Codeine Itching    Assessment/Plan: Hyperlipidemia: LDL not at goal on previous panel with patient taking statin. Will reorder panel as patient would like to know his baseline with diet and exercise only. Have also ordered CRP to evaluate inflammation status. Anticipate NMR will reveal markedly elevated LDL. Discussed options including trial of another statin, Zetia, PCSK9i therapy, and clinical trial. At this time PCSK9i therapy will be best option to bring LDL to goal without intolerable side effects. Discussed risks, benefits, injection technique, and financial obligation of PCSK9i therapy. Will plan to send for PCSK9i therapy based on insurance coverage once labs result.   Will send request for A1c to Dr. Acie Fredrickson.   Thank you,  Lelan Pons. Patterson Hammersmith, [facility]  08/30/2017 4:45 PM

## 2017-08-31 ENCOUNTER — Encounter: Payer: Self-pay | Admitting: Pharmacist

## 2017-08-31 ENCOUNTER — Ambulatory Visit (INDEPENDENT_AMBULATORY_CARE_PROVIDER_SITE_OTHER): Payer: BLUE CROSS/BLUE SHIELD | Admitting: Pharmacist

## 2017-08-31 ENCOUNTER — Encounter (HOSPITAL_COMMUNITY): Payer: Self-pay

## 2017-08-31 ENCOUNTER — Encounter (HOSPITAL_COMMUNITY)
Admission: RE | Admit: 2017-08-31 | Discharge: 2017-08-31 | Disposition: A | Discharge status: Home / Self Care | Payer: BLUE CROSS/BLUE SHIELD | Source: Ambulatory Visit | Attending: Cardiovascular Disease | Admitting: Cardiovascular Disease

## 2017-08-31 DIAGNOSIS — E782 Mixed hyperlipidemia: Secondary | ICD-10-CM | POA: Diagnosis not present

## 2017-08-31 NOTE — Patient Instructions (Addendum)
We will send for coverage for PCSK9 inhibitor therapy. We will call you once we have obtained coverage.   Cholesterol Cholesterol is a fat. Your body needs a small amount of cholesterol. Cholesterol (plaque) may build up in your blood vessels (arteries). That makes you more likely to have a heart attack or stroke. You cannot feel your cholesterol level. Having a blood test is the only way to find out if your level is high. Keep your test results. Work with your doctor to keep your cholesterol at a good level. What do the results mean?  Total cholesterol is how much cholesterol is in your blood.  LDL is bad cholesterol. This is the type that can build up. Try to have low LDL.  HDL is good cholesterol. It cleans your blood vessels and carries LDL away. Try to have high HDL.  Triglycerides are fat that the body can store or burn for energy. What are good levels of cholesterol?  Total cholesterol below 200.  LDL below 100 is good for people who have health risks. LDL below 70 is good for people who have very high risks.  HDL above 40 is good. It is best to have HDL of 60 or higher.  Triglycerides below 150. How can I lower my cholesterol? Diet Follow your diet program as told by your doctor.  Choose fish, white meat chicken, or [place] that is roasted or baked. Try not to eat red meat, fried foods, sausage, or lunch meats.  Eat lots of fresh fruits and vegetables.  Choose whole grains, beans, pasta, potatoes, and cereals.  Choose olive oil, corn oil, or canola oil. Only use small amounts.  Try not to eat butter, mayonnaise, shortening, or palm kernel oils.  Try not to eat foods with trans fats.  Choose low-fat or nonfat dairy foods. ? Drink skim or nonfat milk. ? Eat low-fat or nonfat yogurt and cheeses. ? Try not to drink whole milk or cream. ? Try not to eat ice cream, egg yolks, or full-fat cheeses.  Healthy desserts include angel food cake, ginger snaps, animal crackers,  hard candy, popsicles, and low-fat or nonfat frozen yogurt. Try not to eat pastries, cakes, pies, and cookies.  Exercise Follow your exercise program as told by your doctor.  Be more active. Try gardening, walking, and taking the stairs.  Ask your doctor about ways that you can be more active.  Medicine  Take over-the-counter and prescription medicines only as told by your doctor. This information is not intended to replace advice given to you by your health care provider. Make sure you discuss any questions you have with your health care provider. Document Released: 10/08/2008 Document Revised: 02/11/2016 Document Reviewed: 01/22/2016 Elsevier Interactive Patient Education  [employer].

## 2017-09-01 LAB — C-REACTIVE PROTEIN

## 2017-09-01 LAB — APOLIPOPROTEIN B: Apolipoprotein B: 141 mg/dL — ABNORMAL HIGH (ref <90)

## 2017-09-01 LAB — NMR, LIPOPROFILE
Cholesterol, Total: 229 mg/dL — ABNORMAL HIGH (ref 100–199)
HDL Particle Number: 28.5 umol/L — ABNORMAL LOW (ref >=30.5)
HDL-C: 38 mg/dL — AB (ref >39)
LDL Particle Number: 2294 nmol/L — ABNORMAL HIGH (ref <1000)
LDL Size: 20.2 nm — ABNORMAL LOW (ref >20.5)
LDL-C: 166 mg/dL — ABNORMAL HIGH (ref 0–99)
LP-IR Score: 95 — ABNORMAL HIGH (ref <=45)
SMALL LDL PARTICLE NUMBER: 1402 nmol/L — AB (ref <=527)
Triglycerides: 127 mg/dL (ref 0–149)

## 2017-09-02 ENCOUNTER — Encounter (HOSPITAL_COMMUNITY): Payer: Self-pay

## 2017-09-02 ENCOUNTER — Encounter (HOSPITAL_COMMUNITY)
Admission: RE | Admit: 2017-09-02 | Discharge: 2017-09-02 | Disposition: A | Discharge status: Home / Self Care | Payer: Self-pay | Source: Ambulatory Visit | Attending: Cardiovascular Disease | Admitting: Cardiovascular Disease

## 2017-09-05 ENCOUNTER — Encounter (HOSPITAL_COMMUNITY): Payer: Self-pay

## 2017-09-05 ENCOUNTER — Telehealth: Payer: Self-pay | Admitting: Pharmacist

## 2017-09-05 DIAGNOSIS — E782 Mixed hyperlipidemia: Secondary | ICD-10-CM

## 2017-09-05 MED ORDER — EVOLOCUMAB 140 MG/ML ~~LOC~~ SOAJ: 140.0000 mg | SUBCUTANEOUS | 3 refills | Status: DC

## 2017-09-05 NOTE — Telephone Encounter (Signed)
PA for repatha approved.   Pt notified, RX sent to [facility] for determination of copay.

## 2017-09-06 ENCOUNTER — Ambulatory Visit: Payer: BLUE CROSS/BLUE SHIELD

## 2017-09-06 NOTE — Telephone Encounter (Signed)
Spoke with patient and copay will be $20 per month, which is cost effective for him at this time. He will pick up on Thursday and come to office to perform 1st injection.

## 2017-09-07 ENCOUNTER — Encounter (HOSPITAL_COMMUNITY): Payer: Self-pay

## 2017-09-07 ENCOUNTER — Encounter (HOSPITAL_COMMUNITY)
Admission: RE | Admit: 2017-09-07 | Discharge: 2017-09-07 | Disposition: A | Discharge status: Home / Self Care | Payer: Self-pay | Source: Ambulatory Visit | Attending: Cardiovascular Disease | Admitting: Cardiovascular Disease

## 2017-09-08 NOTE — Telephone Encounter (Signed)
Pt in office today to give first Repatha injection. He demonstrated appropriate injection technique. Labs ordered for after 4 dose.

## 2017-09-09 ENCOUNTER — Encounter (HOSPITAL_COMMUNITY): Payer: Self-pay

## 2017-09-09 ENCOUNTER — Encounter (HOSPITAL_COMMUNITY)
Admission: RE | Admit: 2017-09-09 | Discharge: 2017-09-09 | Disposition: A | Discharge status: Home / Self Care | Payer: Self-pay | Source: Ambulatory Visit | Attending: Cardiovascular Disease | Admitting: Cardiovascular Disease

## 2017-09-12 ENCOUNTER — Encounter (HOSPITAL_COMMUNITY)
Admission: RE | Admit: 2017-09-12 | Discharge: 2017-09-12 | Disposition: A | Discharge status: Home / Self Care | Payer: Self-pay | Source: Ambulatory Visit | Attending: Cardiovascular Disease | Admitting: Cardiovascular Disease

## 2017-09-12 ENCOUNTER — Encounter (HOSPITAL_COMMUNITY): Payer: Self-pay

## 2017-09-14 ENCOUNTER — Encounter (HOSPITAL_COMMUNITY): Payer: Self-pay

## 2017-09-14 ENCOUNTER — Encounter (HOSPITAL_COMMUNITY)
Admission: RE | Admit: 2017-09-14 | Discharge: 2017-09-14 | Disposition: A | Discharge status: Home / Self Care | Payer: Self-pay | Source: Ambulatory Visit | Attending: Cardiovascular Disease | Admitting: Cardiovascular Disease

## 2017-09-16 ENCOUNTER — Encounter (HOSPITAL_COMMUNITY)
Admission: RE | Admit: 2017-09-16 | Discharge: 2017-09-16 | Disposition: A | Discharge status: Home / Self Care | Payer: Self-pay | Source: Ambulatory Visit | Attending: Cardiovascular Disease | Admitting: Cardiovascular Disease

## 2017-09-16 ENCOUNTER — Encounter (HOSPITAL_COMMUNITY): Payer: Self-pay

## 2017-09-19 ENCOUNTER — Encounter (HOSPITAL_COMMUNITY)
Admission: RE | Admit: 2017-09-19 | Discharge: 2017-09-19 | Disposition: A | Discharge status: Home / Self Care | Payer: Self-pay | Source: Ambulatory Visit | Attending: Cardiovascular Disease | Admitting: Cardiovascular Disease

## 2017-09-19 ENCOUNTER — Encounter (HOSPITAL_COMMUNITY): Payer: Self-pay

## 2017-09-19 DIAGNOSIS — M546 Pain in thoracic spine: Secondary | ICD-10-CM | POA: Diagnosis not present

## 2017-09-19 DIAGNOSIS — M542 Cervicalgia: Secondary | ICD-10-CM | POA: Diagnosis not present

## 2017-09-19 DIAGNOSIS — M9901 Segmental and somatic dysfunction of cervical region: Secondary | ICD-10-CM | POA: Diagnosis not present

## 2017-09-19 DIAGNOSIS — M9902 Segmental and somatic dysfunction of thoracic region: Secondary | ICD-10-CM | POA: Diagnosis not present

## 2017-09-20 DIAGNOSIS — M9902 Segmental and somatic dysfunction of thoracic region: Secondary | ICD-10-CM | POA: Diagnosis not present

## 2017-09-20 DIAGNOSIS — M546 Pain in thoracic spine: Secondary | ICD-10-CM | POA: Diagnosis not present

## 2017-09-20 DIAGNOSIS — M542 Cervicalgia: Secondary | ICD-10-CM | POA: Diagnosis not present

## 2017-09-20 DIAGNOSIS — M9901 Segmental and somatic dysfunction of cervical region: Secondary | ICD-10-CM | POA: Diagnosis not present

## 2017-09-21 ENCOUNTER — Encounter (HOSPITAL_COMMUNITY): Payer: Self-pay

## 2017-09-21 ENCOUNTER — Encounter (HOSPITAL_COMMUNITY)
Admission: RE | Admit: 2017-09-21 | Discharge: 2017-09-21 | Disposition: A | Discharge status: Home / Self Care | Payer: Self-pay | Source: Ambulatory Visit | Attending: Cardiovascular Disease | Admitting: Cardiovascular Disease

## 2017-09-22 DIAGNOSIS — M9902 Segmental and somatic dysfunction of thoracic region: Secondary | ICD-10-CM | POA: Diagnosis not present

## 2017-09-22 DIAGNOSIS — M542 Cervicalgia: Secondary | ICD-10-CM | POA: Diagnosis not present

## 2017-09-22 DIAGNOSIS — M546 Pain in thoracic spine: Secondary | ICD-10-CM | POA: Diagnosis not present

## 2017-09-22 DIAGNOSIS — M9901 Segmental and somatic dysfunction of cervical region: Secondary | ICD-10-CM | POA: Diagnosis not present

## 2017-09-23 ENCOUNTER — Encounter (HOSPITAL_COMMUNITY)
Admission: RE | Admit: 2017-09-23 | Discharge: 2017-09-23 | Disposition: A | Discharge status: Home / Self Care | Payer: Self-pay | Source: Ambulatory Visit | Attending: Cardiovascular Disease | Admitting: Cardiovascular Disease

## 2017-09-23 ENCOUNTER — Encounter (HOSPITAL_COMMUNITY): Payer: Self-pay

## 2017-09-23 DIAGNOSIS — Z48812 Encounter for surgical aftercare following surgery on the circulatory system: Secondary | ICD-10-CM | POA: Insufficient documentation

## 2017-09-23 DIAGNOSIS — Z955 Presence of coronary angioplasty implant and graft: Secondary | ICD-10-CM | POA: Insufficient documentation

## 2017-09-23 DIAGNOSIS — R748 Abnormal levels of other serum enzymes: Secondary | ICD-10-CM | POA: Insufficient documentation

## 2017-09-26 ENCOUNTER — Encounter (HOSPITAL_COMMUNITY): Payer: Self-pay

## 2017-09-26 ENCOUNTER — Encounter (HOSPITAL_COMMUNITY)
Admission: RE | Admit: 2017-09-26 | Discharge: 2017-09-26 | Disposition: A | Discharge status: Home / Self Care | Payer: BLUE CROSS/BLUE SHIELD | Source: Ambulatory Visit | Attending: Cardiovascular Disease | Admitting: Cardiovascular Disease

## 2017-09-28 ENCOUNTER — Encounter (HOSPITAL_COMMUNITY)
Admission: RE | Admit: 2017-09-28 | Discharge: 2017-09-28 | Disposition: A | Discharge status: Home / Self Care | Payer: Self-pay | Source: Ambulatory Visit | Attending: Cardiovascular Disease | Admitting: Cardiovascular Disease

## 2017-09-28 ENCOUNTER — Encounter (HOSPITAL_COMMUNITY): Payer: Self-pay

## 2017-09-30 ENCOUNTER — Encounter (HOSPITAL_COMMUNITY): Payer: Self-pay

## 2017-09-30 ENCOUNTER — Encounter (HOSPITAL_COMMUNITY)
Admission: RE | Admit: 2017-09-30 | Discharge: 2017-09-30 | Disposition: A | Discharge status: Home / Self Care | Payer: Self-pay | Source: Ambulatory Visit | Attending: Cardiovascular Disease | Admitting: Cardiovascular Disease

## 2017-10-03 ENCOUNTER — Telehealth (HOSPITAL_COMMUNITY): Payer: Self-pay | Admitting: *Deleted

## 2017-10-03 ENCOUNTER — Encounter (HOSPITAL_COMMUNITY): Payer: Self-pay

## 2017-10-05 ENCOUNTER — Encounter (HOSPITAL_COMMUNITY): Payer: Self-pay

## 2017-10-05 ENCOUNTER — Encounter (HOSPITAL_COMMUNITY)
Admission: RE | Admit: 2017-10-05 | Discharge: 2017-10-05 | Disposition: A | Discharge status: Home / Self Care | Payer: Self-pay | Source: Ambulatory Visit | Attending: Cardiovascular Disease | Admitting: Cardiovascular Disease

## 2017-10-07 ENCOUNTER — Encounter (HOSPITAL_COMMUNITY)
Admission: RE | Admit: 2017-10-07 | Discharge: 2017-10-07 | Disposition: A | Discharge status: Home / Self Care | Payer: BLUE CROSS/BLUE SHIELD | Source: Ambulatory Visit | Attending: Cardiovascular Disease | Admitting: Cardiovascular Disease

## 2017-10-07 ENCOUNTER — Encounter (HOSPITAL_COMMUNITY): Payer: Self-pay

## 2017-10-10 ENCOUNTER — Encounter (HOSPITAL_COMMUNITY)
Admission: RE | Admit: 2017-10-10 | Discharge: 2017-10-10 | Disposition: A | Discharge status: Home / Self Care | Payer: Self-pay | Source: Ambulatory Visit | Attending: Cardiovascular Disease | Admitting: Cardiovascular Disease

## 2017-10-10 ENCOUNTER — Encounter (HOSPITAL_COMMUNITY): Payer: Self-pay

## 2017-10-12 ENCOUNTER — Encounter (HOSPITAL_COMMUNITY): Payer: Self-pay

## 2017-10-12 ENCOUNTER — Encounter (HOSPITAL_COMMUNITY)
Admission: RE | Admit: 2017-10-12 | Discharge: 2017-10-12 | Disposition: A | Discharge status: Home / Self Care | Payer: Self-pay | Source: Ambulatory Visit | Attending: Cardiovascular Disease | Admitting: Cardiovascular Disease

## 2017-10-14 ENCOUNTER — Encounter (HOSPITAL_COMMUNITY): Payer: Self-pay

## 2017-10-17 ENCOUNTER — Encounter (HOSPITAL_COMMUNITY)
Admission: RE | Admit: 2017-10-17 | Discharge: 2017-10-17 | Disposition: A | Discharge status: Home / Self Care | Payer: Self-pay | Source: Ambulatory Visit | Attending: Cardiovascular Disease | Admitting: Cardiovascular Disease

## 2017-10-17 ENCOUNTER — Encounter (HOSPITAL_COMMUNITY): Payer: Self-pay

## 2017-10-19 ENCOUNTER — Encounter (HOSPITAL_COMMUNITY): Payer: Self-pay

## 2017-10-19 ENCOUNTER — Encounter (HOSPITAL_COMMUNITY)
Admission: RE | Admit: 2017-10-19 | Discharge: 2017-10-19 | Disposition: A | Discharge status: Home / Self Care | Payer: Self-pay | Source: Ambulatory Visit | Attending: Cardiovascular Disease | Admitting: Cardiovascular Disease

## 2017-10-21 ENCOUNTER — Encounter (HOSPITAL_COMMUNITY): Payer: Self-pay

## 2017-10-21 ENCOUNTER — Encounter (HOSPITAL_COMMUNITY)
Admission: RE | Admit: 2017-10-21 | Discharge: 2017-10-21 | Disposition: A | Discharge status: Home / Self Care | Payer: BLUE CROSS/BLUE SHIELD | Source: Ambulatory Visit | Attending: Cardiovascular Disease | Admitting: Cardiovascular Disease

## 2017-10-24 ENCOUNTER — Encounter (HOSPITAL_COMMUNITY)
Admission: RE | Admit: 2017-10-24 | Discharge: 2017-10-24 | Disposition: A | Discharge status: Home / Self Care | Payer: Self-pay | Source: Ambulatory Visit | Attending: Cardiovascular Disease | Admitting: Cardiovascular Disease

## 2017-10-24 ENCOUNTER — Encounter (HOSPITAL_COMMUNITY): Payer: Self-pay

## 2017-10-24 DIAGNOSIS — F4010 Social phobia, unspecified: Secondary | ICD-10-CM | POA: Diagnosis not present

## 2017-10-24 DIAGNOSIS — Z48812 Encounter for surgical aftercare following surgery on the circulatory system: Secondary | ICD-10-CM | POA: Insufficient documentation

## 2017-10-24 DIAGNOSIS — Z955 Presence of coronary angioplasty implant and graft: Secondary | ICD-10-CM | POA: Insufficient documentation

## 2017-10-24 DIAGNOSIS — R748 Abnormal levels of other serum enzymes: Secondary | ICD-10-CM | POA: Insufficient documentation

## 2017-10-25 ENCOUNTER — Other Ambulatory Visit: Payer: BLUE CROSS/BLUE SHIELD

## 2017-10-25 DIAGNOSIS — E782 Mixed hyperlipidemia: Secondary | ICD-10-CM

## 2017-10-25 LAB — HEPATIC FUNCTION PANEL
ALT: 20 IU/L (ref 0–44)
AST: 11 IU/L (ref 0–40)
Albumin: 4.6 g/dL (ref 3.5–5.5)
Alkaline Phosphatase: 72 IU/L (ref 39–117)
Bilirubin Total: 0.3 mg/dL (ref 0.0–1.2)
Bilirubin, Direct: 0.12 mg/dL (ref 0.00–0.40)
Total Protein: 6.7 g/dL (ref 6.0–8.5)

## 2017-10-25 LAB — LIPID PANEL
Chol/HDL Ratio: 2.8 ratio (ref 0.0–5.0)
Cholesterol, Total: 131 mg/dL (ref 100–199)
HDL: 47 mg/dL (ref >39)
LDL CALC: 66 mg/dL (ref 0–99)
Triglycerides: 88 mg/dL (ref 0–149)
VLDL Cholesterol Cal: 18 mg/dL (ref 5–40)

## 2017-10-26 ENCOUNTER — Encounter (HOSPITAL_COMMUNITY)
Admission: RE | Admit: 2017-10-26 | Discharge: 2017-10-26 | Disposition: A | Discharge status: Home / Self Care | Payer: BLUE CROSS/BLUE SHIELD | Source: Ambulatory Visit | Attending: Cardiovascular Disease | Admitting: Cardiovascular Disease

## 2017-10-26 ENCOUNTER — Encounter (HOSPITAL_COMMUNITY): Payer: Self-pay

## 2017-10-28 ENCOUNTER — Encounter (HOSPITAL_COMMUNITY): Payer: Self-pay

## 2017-10-28 ENCOUNTER — Encounter (HOSPITAL_COMMUNITY)
Admission: RE | Admit: 2017-10-28 | Discharge: 2017-10-28 | Disposition: A | Discharge status: Home / Self Care | Payer: BLUE CROSS/BLUE SHIELD | Source: Ambulatory Visit | Attending: Cardiovascular Disease | Admitting: Cardiovascular Disease

## 2017-10-31 ENCOUNTER — Encounter (HOSPITAL_COMMUNITY): Payer: Self-pay

## 2017-10-31 ENCOUNTER — Encounter (HOSPITAL_COMMUNITY)
Admission: RE | Admit: 2017-10-31 | Discharge: 2017-10-31 | Disposition: A | Discharge status: Home / Self Care | Payer: Self-pay | Source: Ambulatory Visit | Attending: Cardiovascular Disease | Admitting: Cardiovascular Disease

## 2017-11-02 ENCOUNTER — Encounter (HOSPITAL_COMMUNITY): Payer: Self-pay

## 2017-11-04 ENCOUNTER — Encounter (HOSPITAL_COMMUNITY): Payer: Self-pay

## 2017-11-04 ENCOUNTER — Encounter (HOSPITAL_COMMUNITY)
Admission: RE | Admit: 2017-11-04 | Discharge: 2017-11-04 | Disposition: A | Discharge status: Home / Self Care | Payer: Self-pay | Source: Ambulatory Visit | Attending: Cardiovascular Disease | Admitting: Cardiovascular Disease

## 2017-11-07 ENCOUNTER — Encounter (HOSPITAL_COMMUNITY): Payer: Self-pay

## 2017-11-07 ENCOUNTER — Encounter (HOSPITAL_COMMUNITY)
Admission: RE | Admit: 2017-11-07 | Discharge: 2017-11-07 | Disposition: A | Discharge status: Home / Self Care | Payer: Self-pay | Source: Ambulatory Visit | Attending: Cardiovascular Disease | Admitting: Cardiovascular Disease

## 2017-11-08 ENCOUNTER — Ambulatory Visit (INDEPENDENT_AMBULATORY_CARE_PROVIDER_SITE_OTHER): Payer: BLUE CROSS/BLUE SHIELD | Admitting: Cardiovascular Disease

## 2017-11-08 ENCOUNTER — Encounter: Payer: Self-pay | Admitting: Cardiovascular Disease

## 2017-11-08 VITALS — BP 120/82 | HR 90 | Ht 71.0 in | Wt 180.0 lb

## 2017-11-08 DIAGNOSIS — I2510 Atherosclerotic heart disease of native coronary artery without angina pectoris: Secondary | ICD-10-CM | POA: Diagnosis not present

## 2017-11-08 DIAGNOSIS — E782 Mixed hyperlipidemia: Secondary | ICD-10-CM | POA: Diagnosis not present

## 2017-11-08 NOTE — Patient Instructions (Addendum)
Medication Instructions:  Your physician recommends that you continue on your current medications as directed. Please refer to the Current Medication list given to you today.   Labwork: Your physician recommends that you return for lab work on Thursday - you may come in anytime after 7:30 am You will need to FAST for this appointment - nothing to eat or drink after midnight the night before except water.   Testing/Procedures: None Ordered   Follow-Up: Your physician wants you to follow-up in: 6 months with Dr. Acie Fredrickson.  You will receive a reminder letter in the mail two months in advance. If you don't receive a letter, please call our office to schedule the follow-up appointment.   If you need a refill on your cardiac medications before your next appointment, please call your pharmacy.   Thank you for choosing [facility]! Christen Bame, RN [phone]

## 2017-11-08 NOTE — Progress Notes (Signed)
Cardiology Office Note:    Date:  11/08/2017   ID:  Charles Montoya, DOB [birth year], MRN [id number]  PCP:  Lujean Amel, MD  Cardiologist:  Mertie Moores, MD    Referring MD: London Pepper, MD   Problem List 1. CAD - s/p DES to PDA 2. Hyperlipidemia:   Chief Complaint  Patient presents with  . Coronary Artery Disease  . Hyperlipidemia       Charles Montoya is a 47 y.o. male with a hx of Coronary artery disease. He status post stenting of his posterior descending artery in June, 2018. He has moderate disease and his left anterior descending artery and circumflex proximal artery.  Is exercising 4 times a week. Has stopped smoking  Using nicorette gum .   He was having lots of fatigue - he reduced his atorvastatin to 20 mg QOD.   Fatigue  Is better.     November 08, 2017 Doing well.  Lipid levels look good  Has vague aches and pains.   No angina pain  Exercising regularly  intolerent to statins ,  Is on Repatha Asked questions about insulin resistance   Past Medical History:  Diagnosis Date  . Anxiety   . CAD (coronary artery disease)    a. cath 12/2016--> s/p DES to RPDA, 50% dRCA, 25% pLAD, 40% mLAD, 25% pro to mid CX  . Tobacco abuse     Past Surgical History:  Procedure Laterality Date  . CARDIAC CATHETERIZATION    . CORONARY STENT INTERVENTION N/A 01/19/2017   Procedure: Coronary Stent Intervention;  Surgeon: Jettie Booze, MD;  Location: [facility];  Service: Cardiovascular;  Laterality: N/A;  . HERNIA REPAIR    . LEFT HEART CATH AND CORONARY ANGIOGRAPHY N/A 01/19/2017   Procedure: Left Heart Cath and Coronary Angiography;  Surgeon: Jettie Booze, MD;  Location: [facility];  Service: Cardiovascular;  Laterality: N/A;    Current Medications: Current Meds  Medication Sig  . ALPRAZolam (XANAX) 0.5 MG tablet Take 0.5 mg by mouth 3 (three) times daily as needed for anxiety. Anxiety   . aspirin 81 MG chewable tablet Chew 1 tablet (81 mg  total) by mouth daily.  . clomiPRAMINE (ANAFRANIL) 25 MG capsule Take 25 mg by mouth at bedtime.  . clotrimazole-betamethasone (LOTRISONE) cream Apply 1 application topically daily.  . cyclobenzaprine (FLEXERIL) 10 MG tablet Take 1 tablet (10 mg total) by mouth 2 (two) times daily as needed for muscle spasms.  . Evolocumab (REPATHA SURECLICK) 409 MG/ML SOAJ Inject 140 mg into the skin every 14 (fourteen) days.  . nicotine polacrilex (NICORETTE) 4 MG gum Take 4 mg by mouth as needed for smoking cessation.  . nitroGLYCERIN (NITROSTAT) 0.4 MG SL tablet Place 1 tablet (0.4 mg total) under the tongue every 5 (five) minutes as needed for chest pain.  . ticagrelor (BRILINTA) 90 MG TABS tablet Take 1 tablet (90 mg total) by mouth 2 (two) times daily.     Allergies:   Codeine   Social History   Socioeconomic History  . Marital status: Single    Spouse name: Not on file  . Number of children: Not on file  . Years of education: Not on file  . Highest education level: Not on file  Occupational History  . Not on file  Social Needs  . Financial resource strain: Not on file  . Food insecurity:    Worry: Not on file    Inability: Not on file  .  Transportation needs:    Medical: Not on file    Non-medical: Not on file  Tobacco Use  . Smoking status: Former Smoker    Packs/day: 1.50    Types: Cigarettes    Last attempt to quit: 01/19/2017    Years since quitting: 0.8  . Smokeless tobacco: Never Used  . Tobacco comment: Using nicoderm gum  Substance and Sexual Activity  . Alcohol use: No  . Drug use: No  . Sexual activity: Not on file  Lifestyle  . Physical activity:    Days per week: Not on file    Minutes per session: Not on file  . Stress: Not on file  Relationships  . Social connections:    Talks on phone: Not on file    Gets together: Not on file    Attends religious service: Not on file    Active member of club or organization: Not on file    Attends meetings of clubs or  organizations: Not on file    Relationship status: Not on file  Other Topics Concern  . Not on file  Social History Narrative  . Not on file     Family History: The patient's family history includes CAD in his father; Hypertension in his mother. ROS:   Please see the history of present illness.     All other systems reviewed and are negative.  EKGs/Labs/Other Studies Reviewed:    The following studies were reviewed today:  EKG:  EKG is not  ordered today.     Recent Labs: 08/01/2017: BUN 17; Creatinine, Ser 1.05; Hemoglobin 14.1; Platelets 222; Potassium 4.8; Sodium 137 10/25/2017: ALT 20  Recent Lipid Panel    Component Value Date/Time   CHOL 131 10/25/2017 1147   TRIG 88 10/25/2017 1147   TRIG 163 (H) 06/07/2017 0918   HDL 47 10/25/2017 1147   HDL 34 (L) 06/07/2017 0918   CHOLHDL 2.8 10/25/2017 1147   CHOLHDL 6.6 01/19/2017 0845   VLDL 48 (H) 01/19/2017 0845   LDLCALC 66 10/25/2017 1147   Physical Exam: Blood pressure 120/82, pulse 90, height 5\' 11"  (1.803 m), weight 180 lb (81.6 kg), SpO2 97 %.  GEN:  Well nourished, well developed in no acute distress HEENT: Normal NECK: No JVD; No carotid bruits LYMPHATICS: No lymphadenopathy CARDIAC: RRR , no murmurs, rubs, gallops RESPIRATORY:  Clear to auscultation without rales, wheezing or rhonchi  ABDOMEN: Soft, non-tender, non-distended MUSCULOSKELETAL:  No edema; No deformity  SKIN: Warm and dry NEUROLOGIC:  Alert and oriented x 3    ASSESSMENT:    1. Mixed hyperlipidemia   2. Coronary artery disease involving native coronary artery of native heart without angina pectoris    PLAN:    In order of problems listed above:  1. CAD -    No angina , exercising well  He is exercising at cardiac rehab.  He may need to increase his exercise a bit more than just a cardiac rehab.  2. Hyperlipidema:    - is on Repatha . Numbers are better  Recheck lipiomed profile  later this week.   Medication Adjustments/Labs and  Tests Ordered: Current medicines are reviewed at length with the patient today.  Concerns regarding medicines are outlined above.  Orders Placed This Encounter  Procedures  . NMR LipoProf + Graph  . Apolipoprotein B  . Lipoprotein A (LPA)  . Hepatic function panel  . Basic Metabolic Panel (BMET)   No orders of the defined types were placed in this  encounter.   Signed, Mertie Moores, MD  11/08/2017 6:31 PM    [place]

## 2017-11-09 ENCOUNTER — Encounter (HOSPITAL_COMMUNITY): Payer: Self-pay

## 2017-11-09 ENCOUNTER — Encounter (HOSPITAL_COMMUNITY)
Admission: RE | Admit: 2017-11-09 | Discharge: 2017-11-09 | Disposition: A | Discharge status: Home / Self Care | Payer: Self-pay | Source: Ambulatory Visit | Attending: Cardiovascular Disease | Admitting: Cardiovascular Disease

## 2017-11-10 ENCOUNTER — Other Ambulatory Visit: Payer: BLUE CROSS/BLUE SHIELD

## 2017-11-10 DIAGNOSIS — E782 Mixed hyperlipidemia: Secondary | ICD-10-CM | POA: Diagnosis not present

## 2017-11-10 DIAGNOSIS — I2510 Atherosclerotic heart disease of native coronary artery without angina pectoris: Secondary | ICD-10-CM | POA: Diagnosis not present

## 2017-11-11 ENCOUNTER — Encounter (HOSPITAL_COMMUNITY): Payer: Self-pay

## 2017-11-11 LAB — NMR LIPOPROF + GRAPH
Cholesterol, Total: 148 mg/dL (ref 100–199)
HDL PARTICLE NUMBER: 37.2 umol/L (ref >=30.5)
HDL-C: 46 mg/dL (ref >39)
LDL PARTICLE NUMBER: 898 nmol/L (ref <1000)
LDL SIZE: 20 nm — AB (ref >20.5)
LDL-C: 71 mg/dL (ref 0–99)
LP-IR SCORE: 64 — AB (ref <=45)
SMALL LDL PARTICLE NUMBER: 601 nmol/L — AB (ref <=527)
Triglycerides: 155 mg/dL — ABNORMAL HIGH (ref 0–149)

## 2017-11-11 LAB — BASIC METABOLIC PANEL
BUN/Creatinine Ratio: 12 (ref 9–20)
BUN: 16 mg/dL (ref 6–24)
CALCIUM: 10 mg/dL (ref 8.7–10.2)
CHLORIDE: 103 mmol/L (ref 96–106)
CO2: 25 mmol/L (ref 20–29)
Creatinine, Ser: 1.33 mg/dL — ABNORMAL HIGH (ref 0.76–1.27)
GFR calc non Af Amer: 64 mL/min/{1.73_m2} (ref >59)
GFR, EST AFRICAN AMERICAN: 74 mL/min/{1.73_m2} (ref >59)
GLUCOSE: 93 mg/dL (ref 65–99)
POTASSIUM: 4.8 mmol/L (ref 3.5–5.2)
Sodium: 143 mmol/L (ref 134–144)

## 2017-11-11 LAB — LIPID PANEL
CHOLESTEROL TOTAL: 143 mg/dL (ref 100–199)
Chol/HDL Ratio: 3 ratio (ref 0.0–5.0)
HDL: 47 mg/dL (ref >39)
LDL Calculated: 68 mg/dL (ref 0–99)
Triglycerides: 141 mg/dL (ref 0–149)
VLDL CHOLESTEROL CAL: 28 mg/dL (ref 5–40)

## 2017-11-11 LAB — HEPATIC FUNCTION PANEL
ALT: 20 IU/L (ref 0–44)
AST: 19 IU/L (ref 0–40)
Albumin: 4.7 g/dL (ref 3.5–5.5)
Alkaline Phosphatase: 74 IU/L (ref 39–117)
Bilirubin Total: 0.5 mg/dL (ref 0.0–1.2)
Bilirubin, Direct: 0.12 mg/dL (ref 0.00–0.40)
Total Protein: 6.8 g/dL (ref 6.0–8.5)

## 2017-11-11 LAB — APOLIPOPROTEIN B: APOLIPOPROTEIN B: 71 mg/dL (ref <90)

## 2017-11-11 LAB — LIPOPROTEIN A (LPA): LIPOPROTEIN (A): 7 nmol/L (ref <75)

## 2017-11-14 ENCOUNTER — Encounter (HOSPITAL_COMMUNITY)
Admission: RE | Admit: 2017-11-14 | Discharge: 2017-11-14 | Disposition: A | Discharge status: Home / Self Care | Payer: BLUE CROSS/BLUE SHIELD | Source: Ambulatory Visit | Attending: Cardiovascular Disease | Admitting: Cardiovascular Disease

## 2017-11-14 ENCOUNTER — Encounter (HOSPITAL_COMMUNITY): Payer: Self-pay

## 2017-11-16 ENCOUNTER — Encounter (HOSPITAL_COMMUNITY)
Admission: RE | Admit: 2017-11-16 | Discharge: 2017-11-16 | Disposition: A | Discharge status: Home / Self Care | Payer: BLUE CROSS/BLUE SHIELD | Source: Ambulatory Visit | Attending: Cardiovascular Disease | Admitting: Cardiovascular Disease

## 2017-11-16 ENCOUNTER — Encounter (HOSPITAL_COMMUNITY): Payer: Self-pay

## 2017-11-18 ENCOUNTER — Encounter (HOSPITAL_COMMUNITY): Payer: Self-pay

## 2017-11-18 ENCOUNTER — Encounter (HOSPITAL_COMMUNITY)
Admission: RE | Admit: 2017-11-18 | Discharge: 2017-11-18 | Disposition: A | Discharge status: Home / Self Care | Payer: BLUE CROSS/BLUE SHIELD | Source: Ambulatory Visit | Attending: Cardiovascular Disease | Admitting: Cardiovascular Disease

## 2017-11-21 ENCOUNTER — Encounter (HOSPITAL_COMMUNITY): Payer: Self-pay

## 2017-11-21 ENCOUNTER — Encounter (HOSPITAL_COMMUNITY)
Admission: RE | Admit: 2017-11-21 | Discharge: 2017-11-21 | Disposition: A | Discharge status: Home / Self Care | Payer: Self-pay | Source: Ambulatory Visit | Attending: Cardiovascular Disease | Admitting: Cardiovascular Disease

## 2017-11-23 ENCOUNTER — Encounter (HOSPITAL_COMMUNITY)
Admission: RE | Admit: 2017-11-23 | Discharge: 2017-11-23 | Disposition: A | Discharge status: Home / Self Care | Payer: Self-pay | Source: Ambulatory Visit | Attending: Cardiovascular Disease | Admitting: Cardiovascular Disease

## 2017-11-23 DIAGNOSIS — I25110 Atherosclerotic heart disease of native coronary artery with unstable angina pectoris: Secondary | ICD-10-CM | POA: Insufficient documentation

## 2017-11-25 ENCOUNTER — Encounter (HOSPITAL_COMMUNITY): Payer: Self-pay

## 2017-11-28 ENCOUNTER — Encounter (HOSPITAL_COMMUNITY)
Admission: RE | Admit: 2017-11-28 | Discharge: 2017-11-28 | Disposition: A | Discharge status: Home / Self Care | Payer: Self-pay | Source: Ambulatory Visit | Attending: Cardiovascular Disease | Admitting: Cardiovascular Disease

## 2017-11-29 DIAGNOSIS — M545 Low back pain: Secondary | ICD-10-CM | POA: Diagnosis not present

## 2017-11-29 DIAGNOSIS — M546 Pain in thoracic spine: Secondary | ICD-10-CM | POA: Diagnosis not present

## 2017-11-29 DIAGNOSIS — M9903 Segmental and somatic dysfunction of lumbar region: Secondary | ICD-10-CM | POA: Diagnosis not present

## 2017-11-29 DIAGNOSIS — M9902 Segmental and somatic dysfunction of thoracic region: Secondary | ICD-10-CM | POA: Diagnosis not present

## 2017-11-30 ENCOUNTER — Encounter (HOSPITAL_COMMUNITY)
Admission: RE | Admit: 2017-11-30 | Discharge: 2017-11-30 | Disposition: A | Discharge status: Home / Self Care | Payer: Self-pay | Source: Ambulatory Visit | Attending: Cardiovascular Disease | Admitting: Cardiovascular Disease

## 2017-12-02 ENCOUNTER — Encounter (HOSPITAL_COMMUNITY)
Admission: RE | Admit: 2017-12-02 | Discharge: 2017-12-02 | Disposition: A | Discharge status: Home / Self Care | Payer: Self-pay | Source: Ambulatory Visit | Attending: Cardiovascular Disease | Admitting: Cardiovascular Disease

## 2017-12-05 ENCOUNTER — Encounter (HOSPITAL_COMMUNITY)
Admission: RE | Admit: 2017-12-05 | Discharge: 2017-12-05 | Disposition: A | Discharge status: Home / Self Care | Payer: Self-pay | Source: Ambulatory Visit | Attending: Cardiovascular Disease | Admitting: Cardiovascular Disease

## 2017-12-07 ENCOUNTER — Encounter (HOSPITAL_COMMUNITY)
Admission: RE | Admit: 2017-12-07 | Discharge: 2017-12-07 | Disposition: A | Discharge status: Home / Self Care | Payer: Self-pay | Source: Ambulatory Visit | Attending: Cardiovascular Disease | Admitting: Cardiovascular Disease

## 2017-12-09 ENCOUNTER — Encounter (HOSPITAL_COMMUNITY)
Admission: RE | Admit: 2017-12-09 | Discharge: 2017-12-09 | Disposition: A | Discharge status: Home / Self Care | Payer: Self-pay | Source: Ambulatory Visit | Attending: Cardiovascular Disease | Admitting: Cardiovascular Disease

## 2017-12-12 ENCOUNTER — Encounter (HOSPITAL_COMMUNITY)
Admission: RE | Admit: 2017-12-12 | Discharge: 2017-12-12 | Disposition: A | Discharge status: Home / Self Care | Payer: Self-pay | Source: Ambulatory Visit | Attending: Cardiovascular Disease | Admitting: Cardiovascular Disease

## 2017-12-14 ENCOUNTER — Encounter (HOSPITAL_COMMUNITY)
Admission: RE | Admit: 2017-12-14 | Discharge: 2017-12-14 | Disposition: A | Discharge status: Home / Self Care | Payer: Self-pay | Source: Ambulatory Visit | Attending: Cardiovascular Disease | Admitting: Cardiovascular Disease

## 2017-12-16 ENCOUNTER — Encounter (HOSPITAL_COMMUNITY): Payer: Self-pay

## 2017-12-16 DIAGNOSIS — L659 Nonscarring hair loss, unspecified: Secondary | ICD-10-CM | POA: Diagnosis not present

## 2017-12-16 DIAGNOSIS — N529 Male erectile dysfunction, unspecified: Secondary | ICD-10-CM | POA: Diagnosis not present

## 2017-12-16 DIAGNOSIS — R7301 Impaired fasting glucose: Secondary | ICD-10-CM | POA: Diagnosis not present

## 2017-12-21 ENCOUNTER — Encounter (HOSPITAL_COMMUNITY)
Admission: RE | Admit: 2017-12-21 | Discharge: 2017-12-21 | Disposition: A | Discharge status: Home / Self Care | Payer: Self-pay | Source: Ambulatory Visit | Attending: Cardiovascular Disease | Admitting: Cardiovascular Disease

## 2017-12-23 ENCOUNTER — Encounter (HOSPITAL_COMMUNITY)
Admission: RE | Admit: 2017-12-23 | Discharge: 2017-12-23 | Disposition: A | Discharge status: Home / Self Care | Payer: Self-pay | Source: Ambulatory Visit | Attending: Cardiovascular Disease | Admitting: Cardiovascular Disease

## 2017-12-26 ENCOUNTER — Encounter (HOSPITAL_COMMUNITY): Payer: Self-pay

## 2017-12-26 DIAGNOSIS — I25110 Atherosclerotic heart disease of native coronary artery with unstable angina pectoris: Secondary | ICD-10-CM | POA: Insufficient documentation

## 2017-12-27 DIAGNOSIS — M9903 Segmental and somatic dysfunction of lumbar region: Secondary | ICD-10-CM | POA: Diagnosis not present

## 2017-12-27 DIAGNOSIS — M545 Low back pain: Secondary | ICD-10-CM | POA: Diagnosis not present

## 2017-12-27 DIAGNOSIS — M546 Pain in thoracic spine: Secondary | ICD-10-CM | POA: Diagnosis not present

## 2017-12-27 DIAGNOSIS — M9902 Segmental and somatic dysfunction of thoracic region: Secondary | ICD-10-CM | POA: Diagnosis not present

## 2017-12-28 ENCOUNTER — Encounter (HOSPITAL_COMMUNITY)
Admission: RE | Admit: 2017-12-28 | Discharge: 2017-12-28 | Disposition: A | Discharge status: Home / Self Care | Payer: Self-pay | Source: Ambulatory Visit | Attending: Cardiovascular Disease | Admitting: Cardiovascular Disease

## 2017-12-28 DIAGNOSIS — M9903 Segmental and somatic dysfunction of lumbar region: Secondary | ICD-10-CM | POA: Diagnosis not present

## 2017-12-28 DIAGNOSIS — M9902 Segmental and somatic dysfunction of thoracic region: Secondary | ICD-10-CM | POA: Diagnosis not present

## 2017-12-28 DIAGNOSIS — M546 Pain in thoracic spine: Secondary | ICD-10-CM | POA: Diagnosis not present

## 2017-12-28 DIAGNOSIS — M545 Low back pain: Secondary | ICD-10-CM | POA: Diagnosis not present

## 2017-12-30 ENCOUNTER — Encounter (HOSPITAL_COMMUNITY)
Admission: RE | Admit: 2017-12-30 | Discharge: 2017-12-30 | Disposition: A | Discharge status: Home / Self Care | Payer: Self-pay | Source: Ambulatory Visit | Attending: Cardiovascular Disease | Admitting: Cardiovascular Disease

## 2018-01-02 ENCOUNTER — Encounter (HOSPITAL_COMMUNITY)
Admission: RE | Admit: 2018-01-02 | Discharge: 2018-01-02 | Disposition: A | Discharge status: Home / Self Care | Payer: Self-pay | Source: Ambulatory Visit | Attending: Cardiovascular Disease | Admitting: Cardiovascular Disease

## 2018-01-04 ENCOUNTER — Encounter (HOSPITAL_COMMUNITY)
Admission: RE | Admit: 2018-01-04 | Discharge: 2018-01-04 | Disposition: A | Discharge status: Home / Self Care | Payer: Self-pay | Source: Ambulatory Visit | Attending: Cardiovascular Disease | Admitting: Cardiovascular Disease

## 2018-01-06 ENCOUNTER — Encounter (HOSPITAL_COMMUNITY)
Admission: RE | Admit: 2018-01-06 | Discharge: 2018-01-06 | Disposition: A | Discharge status: Home / Self Care | Payer: Self-pay | Source: Ambulatory Visit | Attending: Cardiovascular Disease | Admitting: Cardiovascular Disease

## 2018-01-09 ENCOUNTER — Encounter (HOSPITAL_COMMUNITY)
Admission: RE | Admit: 2018-01-09 | Discharge: 2018-01-09 | Disposition: A | Discharge status: Home / Self Care | Payer: Self-pay | Source: Ambulatory Visit | Attending: Cardiovascular Disease | Admitting: Cardiovascular Disease

## 2018-01-11 ENCOUNTER — Encounter (HOSPITAL_COMMUNITY): Payer: Self-pay

## 2018-01-13 ENCOUNTER — Encounter (HOSPITAL_COMMUNITY)
Admission: RE | Admit: 2018-01-13 | Discharge: 2018-01-13 | Disposition: A | Discharge status: Home / Self Care | Payer: Self-pay | Source: Ambulatory Visit | Attending: Cardiovascular Disease | Admitting: Cardiovascular Disease

## 2018-01-13 NOTE — Telephone Encounter (Signed)
Disregard opened in error °

## 2018-01-16 ENCOUNTER — Encounter (HOSPITAL_COMMUNITY)
Admission: RE | Admit: 2018-01-16 | Discharge: 2018-01-16 | Disposition: A | Discharge status: Home / Self Care | Payer: BLUE CROSS/BLUE SHIELD | Source: Ambulatory Visit | Attending: Cardiovascular Disease | Admitting: Cardiovascular Disease

## 2018-01-16 ENCOUNTER — Other Ambulatory Visit: Payer: Self-pay | Admitting: Cardiovascular Disease

## 2018-01-18 ENCOUNTER — Encounter (HOSPITAL_COMMUNITY): Payer: Self-pay

## 2018-01-20 ENCOUNTER — Encounter (HOSPITAL_COMMUNITY)
Admission: RE | Admit: 2018-01-20 | Discharge: 2018-01-20 | Disposition: A | Discharge status: Home / Self Care | Payer: BLUE CROSS/BLUE SHIELD | Source: Ambulatory Visit | Attending: Cardiovascular Disease | Admitting: Cardiovascular Disease

## 2018-01-23 ENCOUNTER — Encounter (HOSPITAL_COMMUNITY): Payer: Self-pay

## 2018-01-23 DIAGNOSIS — I25110 Atherosclerotic heart disease of native coronary artery with unstable angina pectoris: Secondary | ICD-10-CM | POA: Insufficient documentation

## 2018-01-25 ENCOUNTER — Encounter (HOSPITAL_COMMUNITY)
Admission: RE | Admit: 2018-01-25 | Discharge: 2018-01-25 | Disposition: A | Discharge status: Home / Self Care | Payer: Self-pay | Source: Ambulatory Visit | Attending: Cardiovascular Disease | Admitting: Cardiovascular Disease

## 2018-01-27 ENCOUNTER — Encounter (HOSPITAL_COMMUNITY): Payer: Self-pay

## 2018-01-30 ENCOUNTER — Encounter (HOSPITAL_COMMUNITY)
Admission: RE | Admit: 2018-01-30 | Discharge: 2018-01-30 | Disposition: A | Discharge status: Home / Self Care | Payer: Self-pay | Source: Ambulatory Visit | Attending: Cardiovascular Disease | Admitting: Cardiovascular Disease

## 2018-02-01 ENCOUNTER — Encounter (HOSPITAL_COMMUNITY)
Admission: RE | Admit: 2018-02-01 | Discharge: 2018-02-01 | Disposition: A | Discharge status: Home / Self Care | Payer: Self-pay | Source: Ambulatory Visit | Attending: Cardiovascular Disease | Admitting: Cardiovascular Disease

## 2018-02-03 ENCOUNTER — Encounter (HOSPITAL_COMMUNITY): Payer: Self-pay

## 2018-02-06 ENCOUNTER — Encounter (HOSPITAL_COMMUNITY): Payer: Self-pay

## 2018-02-08 ENCOUNTER — Encounter (HOSPITAL_COMMUNITY)
Admission: RE | Admit: 2018-02-08 | Discharge: 2018-02-08 | Disposition: A | Discharge status: Home / Self Care | Payer: Self-pay | Source: Ambulatory Visit | Attending: Cardiovascular Disease | Admitting: Cardiovascular Disease

## 2018-02-10 ENCOUNTER — Encounter (HOSPITAL_COMMUNITY)
Admission: RE | Admit: 2018-02-10 | Discharge: 2018-02-10 | Disposition: A | Discharge status: Home / Self Care | Payer: Self-pay | Source: Ambulatory Visit | Attending: Cardiovascular Disease | Admitting: Cardiovascular Disease

## 2018-02-13 ENCOUNTER — Encounter (HOSPITAL_COMMUNITY): Payer: Self-pay

## 2018-02-15 ENCOUNTER — Encounter (HOSPITAL_COMMUNITY)
Admission: RE | Admit: 2018-02-15 | Discharge: 2018-02-15 | Disposition: A | Discharge status: Home / Self Care | Payer: Self-pay | Source: Ambulatory Visit | Attending: Cardiovascular Disease | Admitting: Cardiovascular Disease

## 2018-02-17 ENCOUNTER — Encounter (HOSPITAL_COMMUNITY)
Admission: RE | Admit: 2018-02-17 | Discharge: 2018-02-17 | Disposition: A | Discharge status: Home / Self Care | Payer: Self-pay | Source: Ambulatory Visit | Attending: Cardiovascular Disease | Admitting: Cardiovascular Disease

## 2018-02-20 ENCOUNTER — Encounter (HOSPITAL_COMMUNITY)
Admission: RE | Admit: 2018-02-20 | Discharge: 2018-02-20 | Disposition: A | Discharge status: Home / Self Care | Payer: Self-pay | Source: Ambulatory Visit | Attending: Cardiovascular Disease | Admitting: Cardiovascular Disease

## 2018-02-21 DIAGNOSIS — M9901 Segmental and somatic dysfunction of cervical region: Secondary | ICD-10-CM | POA: Diagnosis not present

## 2018-02-21 DIAGNOSIS — M542 Cervicalgia: Secondary | ICD-10-CM | POA: Diagnosis not present

## 2018-02-21 DIAGNOSIS — M9902 Segmental and somatic dysfunction of thoracic region: Secondary | ICD-10-CM | POA: Diagnosis not present

## 2018-02-21 DIAGNOSIS — M546 Pain in thoracic spine: Secondary | ICD-10-CM | POA: Diagnosis not present

## 2018-02-22 ENCOUNTER — Encounter (HOSPITAL_COMMUNITY): Payer: Self-pay

## 2018-02-23 DIAGNOSIS — R5383 Other fatigue: Secondary | ICD-10-CM | POA: Diagnosis not present

## 2018-02-23 DIAGNOSIS — R8290 Unspecified abnormal findings in urine: Secondary | ICD-10-CM | POA: Diagnosis not present

## 2018-02-23 DIAGNOSIS — Z79899 Other long term (current) drug therapy: Secondary | ICD-10-CM | POA: Diagnosis not present

## 2018-02-23 DIAGNOSIS — M791 Myalgia, unspecified site: Secondary | ICD-10-CM | POA: Diagnosis not present

## 2018-02-24 ENCOUNTER — Encounter (HOSPITAL_COMMUNITY): Payer: Self-pay

## 2018-02-24 DIAGNOSIS — I25110 Atherosclerotic heart disease of native coronary artery with unstable angina pectoris: Secondary | ICD-10-CM | POA: Insufficient documentation

## 2018-02-27 ENCOUNTER — Encounter (HOSPITAL_COMMUNITY): Payer: Self-pay

## 2018-03-01 ENCOUNTER — Encounter (HOSPITAL_COMMUNITY)
Admission: RE | Admit: 2018-03-01 | Discharge: 2018-03-01 | Disposition: A | Discharge status: Home / Self Care | Payer: Self-pay | Source: Ambulatory Visit | Attending: Cardiovascular Disease | Admitting: Cardiovascular Disease

## 2018-03-01 ENCOUNTER — Encounter: Payer: Self-pay | Admitting: Cardiovascular Disease

## 2018-03-03 ENCOUNTER — Encounter (HOSPITAL_COMMUNITY)
Admission: RE | Admit: 2018-03-03 | Discharge: 2018-03-03 | Disposition: A | Discharge status: Home / Self Care | Payer: Self-pay | Source: Ambulatory Visit | Attending: Cardiovascular Disease | Admitting: Cardiovascular Disease

## 2018-03-06 ENCOUNTER — Encounter (HOSPITAL_COMMUNITY)
Admission: RE | Admit: 2018-03-06 | Discharge: 2018-03-06 | Disposition: A | Discharge status: Home / Self Care | Payer: Self-pay | Source: Ambulatory Visit | Attending: Cardiovascular Disease | Admitting: Cardiovascular Disease

## 2018-03-08 ENCOUNTER — Encounter (HOSPITAL_COMMUNITY): Payer: Self-pay

## 2018-03-10 ENCOUNTER — Encounter (HOSPITAL_COMMUNITY)
Admission: RE | Admit: 2018-03-10 | Discharge: 2018-03-10 | Disposition: A | Discharge status: Home / Self Care | Payer: Self-pay | Source: Ambulatory Visit | Attending: Cardiovascular Disease | Admitting: Cardiovascular Disease

## 2018-03-13 ENCOUNTER — Encounter (HOSPITAL_COMMUNITY)
Admission: RE | Admit: 2018-03-13 | Discharge: 2018-03-13 | Disposition: A | Discharge status: Home / Self Care | Payer: Self-pay | Source: Ambulatory Visit | Attending: Cardiovascular Disease | Admitting: Cardiovascular Disease

## 2018-03-15 ENCOUNTER — Encounter (HOSPITAL_COMMUNITY): Payer: Self-pay

## 2018-03-17 ENCOUNTER — Encounter (HOSPITAL_COMMUNITY)
Admission: RE | Admit: 2018-03-17 | Discharge: 2018-03-17 | Disposition: A | Discharge status: Home / Self Care | Payer: BLUE CROSS/BLUE SHIELD | Source: Ambulatory Visit | Attending: Cardiovascular Disease | Admitting: Cardiovascular Disease

## 2018-03-20 ENCOUNTER — Encounter (HOSPITAL_COMMUNITY): Payer: Self-pay

## 2018-03-22 ENCOUNTER — Encounter (HOSPITAL_COMMUNITY): Payer: Self-pay

## 2018-03-24 ENCOUNTER — Encounter (HOSPITAL_COMMUNITY)
Admission: RE | Admit: 2018-03-24 | Discharge: 2018-03-24 | Disposition: A | Discharge status: Home / Self Care | Payer: BLUE CROSS/BLUE SHIELD | Source: Ambulatory Visit | Attending: Cardiovascular Disease | Admitting: Cardiovascular Disease

## 2018-03-29 ENCOUNTER — Encounter (HOSPITAL_COMMUNITY)
Admission: RE | Admit: 2018-03-29 | Discharge: 2018-03-29 | Disposition: A | Discharge status: Home / Self Care | Payer: BLUE CROSS/BLUE SHIELD | Source: Ambulatory Visit | Attending: Cardiovascular Disease | Admitting: Cardiovascular Disease

## 2018-03-29 DIAGNOSIS — I25110 Atherosclerotic heart disease of native coronary artery with unstable angina pectoris: Secondary | ICD-10-CM | POA: Insufficient documentation

## 2018-03-31 ENCOUNTER — Encounter (HOSPITAL_COMMUNITY): Payer: Self-pay

## 2018-04-03 ENCOUNTER — Encounter (HOSPITAL_COMMUNITY)
Admission: RE | Admit: 2018-04-03 | Discharge: 2018-04-03 | Disposition: A | Discharge status: Home / Self Care | Payer: BLUE CROSS/BLUE SHIELD | Source: Ambulatory Visit | Attending: Cardiovascular Disease | Admitting: Cardiovascular Disease

## 2018-04-05 ENCOUNTER — Encounter (HOSPITAL_COMMUNITY)
Admission: RE | Admit: 2018-04-05 | Discharge: 2018-04-05 | Disposition: A | Discharge status: Home / Self Care | Payer: Self-pay | Source: Ambulatory Visit | Attending: Cardiovascular Disease | Admitting: Cardiovascular Disease

## 2018-04-07 ENCOUNTER — Encounter (HOSPITAL_COMMUNITY): Payer: Self-pay

## 2018-04-07 DIAGNOSIS — R5383 Other fatigue: Secondary | ICD-10-CM | POA: Diagnosis not present

## 2018-04-07 DIAGNOSIS — I2510 Atherosclerotic heart disease of native coronary artery without angina pectoris: Secondary | ICD-10-CM | POA: Diagnosis not present

## 2018-04-07 DIAGNOSIS — L409 Psoriasis, unspecified: Secondary | ICD-10-CM | POA: Diagnosis not present

## 2018-04-10 ENCOUNTER — Encounter (HOSPITAL_COMMUNITY)
Admission: RE | Admit: 2018-04-10 | Discharge: 2018-04-10 | Disposition: A | Discharge status: Home / Self Care | Payer: Self-pay | Source: Ambulatory Visit | Attending: Cardiovascular Disease | Admitting: Cardiovascular Disease

## 2018-04-12 ENCOUNTER — Encounter (HOSPITAL_COMMUNITY)
Admission: RE | Admit: 2018-04-12 | Discharge: 2018-04-12 | Disposition: A | Discharge status: Home / Self Care | Payer: Self-pay | Source: Ambulatory Visit | Attending: Cardiovascular Disease | Admitting: Cardiovascular Disease

## 2018-04-14 ENCOUNTER — Encounter (HOSPITAL_COMMUNITY): Payer: Self-pay

## 2018-04-17 ENCOUNTER — Encounter (HOSPITAL_COMMUNITY)
Admission: RE | Admit: 2018-04-17 | Discharge: 2018-04-17 | Disposition: A | Discharge status: Home / Self Care | Payer: BLUE CROSS/BLUE SHIELD | Source: Ambulatory Visit | Attending: Cardiovascular Disease | Admitting: Cardiovascular Disease

## 2018-04-19 ENCOUNTER — Encounter (HOSPITAL_COMMUNITY)
Admission: RE | Admit: 2018-04-19 | Discharge: 2018-04-19 | Disposition: A | Discharge status: Home / Self Care | Payer: Self-pay | Source: Ambulatory Visit | Attending: Cardiovascular Disease | Admitting: Cardiovascular Disease

## 2018-04-21 ENCOUNTER — Encounter (HOSPITAL_COMMUNITY): Payer: Self-pay

## 2018-04-24 ENCOUNTER — Encounter (HOSPITAL_COMMUNITY): Payer: Self-pay

## 2018-04-24 DIAGNOSIS — M9901 Segmental and somatic dysfunction of cervical region: Secondary | ICD-10-CM | POA: Diagnosis not present

## 2018-04-24 DIAGNOSIS — M9902 Segmental and somatic dysfunction of thoracic region: Secondary | ICD-10-CM | POA: Diagnosis not present

## 2018-04-24 DIAGNOSIS — M546 Pain in thoracic spine: Secondary | ICD-10-CM | POA: Diagnosis not present

## 2018-04-24 DIAGNOSIS — M542 Cervicalgia: Secondary | ICD-10-CM | POA: Diagnosis not present

## 2018-04-24 DIAGNOSIS — F4010 Social phobia, unspecified: Secondary | ICD-10-CM | POA: Diagnosis not present

## 2018-04-26 ENCOUNTER — Encounter: Payer: Self-pay | Admitting: Cardiovascular Disease

## 2018-04-26 ENCOUNTER — Encounter (HOSPITAL_COMMUNITY): Payer: Self-pay

## 2018-04-26 ENCOUNTER — Ambulatory Visit (INDEPENDENT_AMBULATORY_CARE_PROVIDER_SITE_OTHER): Payer: BLUE CROSS/BLUE SHIELD | Admitting: Cardiovascular Disease

## 2018-04-26 ENCOUNTER — Encounter

## 2018-04-26 VITALS — BP 102/62 | HR 86 | Ht 71.0 in | Wt 182.0 lb

## 2018-04-26 DIAGNOSIS — I2510 Atherosclerotic heart disease of native coronary artery without angina pectoris: Secondary | ICD-10-CM | POA: Diagnosis not present

## 2018-04-26 DIAGNOSIS — E782 Mixed hyperlipidemia: Secondary | ICD-10-CM | POA: Diagnosis not present

## 2018-04-26 DIAGNOSIS — I25110 Atherosclerotic heart disease of native coronary artery with unstable angina pectoris: Secondary | ICD-10-CM | POA: Insufficient documentation

## 2018-04-26 MED ORDER — TICAGRELOR 60 MG PO TABS: 60.0000 mg | ORAL_TABLET | Freq: Two times a day (BID) | ORAL | 11 refills | Status: DC

## 2018-04-26 NOTE — Patient Instructions (Addendum)
Medication Instructions:  Your physician has recommended you make the following change in your medication:   DECREASE Brilinta (Ticagrelor) to 60 mg twice daily STOP Aspirin   Labwork: None Ordered   Testing/Procedures: None Ordered   Follow-Up: You have been referred to Dr. Debara Pickett for management of cholesterol  Your physician wants you to follow-up in: 1 year with Dr. Acie Fredrickson. You will receive a reminder letter in the mail two months in advance. If you don't receive a letter, please call our office to schedule the follow-up appointment.    If you need a refill on your cardiac medications before your next appointment, please call your pharmacy.   Thank you for choosing [facility]! Christen Bame, RN [phone]

## 2018-04-26 NOTE — Progress Notes (Signed)
Cardiology Office Note:    Date:  04/26/2018   ID:  Charles Montoya, DOB 1970-11-04, MRN [id number]  PCP:  Lujean Amel, MD  Cardiologist:  Mertie Moores, MD    Referring MD: Lujean Amel, MD   Problem List 1. CAD - s/p DES to PDA 2. Hyperlipidemia:   Chief Complaint  Patient presents with  . Coronary Artery Disease       Charles Montoya is a 47 y.o. male with a hx of Coronary artery disease. He status post stenting of his posterior descending artery in June, 2018. He has moderate disease and his left anterior descending artery and circumflex proximal artery.  Is exercising 4 times a week. Has stopped smoking  Using nicorette gum .   He was having lots of fatigue - he reduced his atorvastatin to 20 mg QOD.   Fatigue  Is better.     November 08, 2017 Doing well.  Lipid levels look good  Has vague aches and pains.   No angina pain  Exercising regularly  intolerent to statins ,  Is on Repatha Asked questions about insulin resistance  Oct. 2, 2019:  Charles Montoya is seen back for follow up of his CAD.   S/p stenting of his PDA. Was on Repatha - became very fatigued. Stopped the Repatha l several weeks ago.   Is finally feeling better now Tried multiple statins,  All the ones he tried made him ache.  , foggy headed. , fatigued.     Past Medical History:  Diagnosis Date  . Anxiety   . CAD (coronary artery disease)    a. cath 12/2016--> s/p DES to RPDA, 50% dRCA, 25% pLAD, 40% mLAD, 25% pro to mid CX  . Tobacco abuse     Past Surgical History:  Procedure Laterality Date  . CARDIAC CATHETERIZATION    . CORONARY STENT INTERVENTION N/A 01/19/2017   Procedure: Coronary Stent Intervention;  Surgeon: Jettie Booze, MD;  Location: [facility];  Service: Cardiovascular;  Laterality: N/A;  . HERNIA REPAIR    . LEFT HEART CATH AND CORONARY ANGIOGRAPHY N/A 01/19/2017   Procedure: Left Heart Cath and Coronary Angiography;  Surgeon: Jettie Booze, MD;  Location: [facility];  Service: Cardiovascular;  Laterality: N/A;    Current Medications: Current Meds  Medication Sig  . ALPRAZolam (XANAX) 0.5 MG tablet Take 0.5 mg by mouth 3 (three) times daily as needed for anxiety. Anxiety   . clomiPRAMINE (ANAFRANIL) 25 MG capsule Take 25 mg by mouth at bedtime.  . clotrimazole-betamethasone (LOTRISONE) cream Apply 1 application topically daily.  . cyclobenzaprine (FLEXERIL) 10 MG tablet Take 1 tablet (10 mg total) by mouth 2 (two) times daily as needed for muscle spasms.  . Evolocumab (REPATHA SURECLICK) 235 MG/ML SOAJ Inject 140 mg into the skin every 14 (fourteen) days.  . nicotine polacrilex (NICORETTE) 4 MG gum Take 4 mg by mouth as needed for smoking cessation.  . nitroGLYCERIN (NITROSTAT) 0.4 MG SL tablet Place 1 tablet (0.4 mg total) under the tongue every 5 (five) minutes as needed for chest pain.  . [DISCONTINUED] aspirin 81 MG chewable tablet Chew 1 tablet (81 mg total) by mouth daily.  . [DISCONTINUED] BRILINTA 90 MG TABS tablet TAKE 1 TABLET BY MOUTH TWICE DAILY.     Allergies:   Codeine   Social History   Socioeconomic History  . Marital status: Single    Spouse name: Not on file  . Number of children: Not  on file  . Years of education: Not on file  . Highest education level: Not on file  Occupational History  . Not on file  Social Needs  . Financial resource strain: Not on file  . Food insecurity:    Worry: Not on file    Inability: Not on file  . Transportation needs:    Medical: Not on file    Non-medical: Not on file  Tobacco Use  . Smoking status: Former Smoker    Packs/day: 1.50    Types: Cigarettes    Last attempt to quit: 01/19/2017    Years since quitting: 1.2  . Smokeless tobacco: Never Used  . Tobacco comment: Using nicoderm gum  Substance and Sexual Activity  . Alcohol use: No  . Drug use: No  . Sexual activity: Not on file  Lifestyle  . Physical activity:    Days per week: Not on file    Minutes per  session: Not on file  . Stress: Not on file  Relationships  . Social connections:    Talks on phone: Not on file    Gets together: Not on file    Attends religious service: Not on file    Active member of club or organization: Not on file    Attends meetings of clubs or organizations: Not on file    Relationship status: Not on file  Other Topics Concern  . Not on file  Social History Narrative  . Not on file     Family History: The patient's family history includes CAD in his father; Hypertension in his mother. ROS:   Please see the history of present illness.     All other systems reviewed and are negative.  EKGs/Labs/Other Studies Reviewed:    The following studies were reviewed today:  EKG:      Recent Labs: 08/01/2017: Hemoglobin 14.1; Platelets 222 11/10/2017: ALT 20; BUN 16; Creatinine, Ser 1.33; Potassium 4.8; Sodium 143  Recent Lipid Panel    Component Value Date/Time   CHOL 143 11/10/2017 1045   TRIG 141 11/10/2017 1045   TRIG 163 (H) 06/07/2017 0918   HDL 47 11/10/2017 1045   HDL 34 (L) 06/07/2017 0918   CHOLHDL 3.0 11/10/2017 1045   CHOLHDL 6.6 01/19/2017 0845   VLDL 48 (H) 01/19/2017 0845   LDLCALC 68 11/10/2017 1045      ASSESSMENT:    1. Mixed hyperlipidemia   2. Coronary artery disease involving native coronary artery of native heart without angina pectoris    PLAN:    In order of problems listed above:  CAD -    status post stenting of his posterior descending artery using a 2.5 x 16 mm Promus drug-eluting stent. Is not having any angina . Discussed stopping brilinta.   He wants to continue brilinta . Will lower dose to 60 mg BID  Will DC ASA   2. Hyperlipidema:   Was on Repatha.    Has caused some fatigue    Medication Adjustments/Labs and Tests Ordered: Current medicines are reviewed at length with the patient today.  Concerns regarding medicines are outlined above.  Orders Placed This Encounter  Procedures  . AMB Referral to  [place] ordered this encounter  Medications  . ticagrelor (BRILINTA) 60 MG TABS tablet    Sig: Take 1 tablet (60 mg total) by mouth 2 (two) times daily.    Dispense:  60 tablet    Refill:  11  Signed, Mertie Moores, MD  04/26/2018 6:51 PM    [place]

## 2018-04-27 ENCOUNTER — Telehealth: Payer: Self-pay | Admitting: Cardiovascular Disease

## 2018-04-27 DIAGNOSIS — I2510 Atherosclerotic heart disease of native coronary artery without angina pectoris: Secondary | ICD-10-CM

## 2018-04-27 DIAGNOSIS — E785 Hyperlipidemia, unspecified: Secondary | ICD-10-CM

## 2018-04-27 NOTE — Telephone Encounter (Signed)
Message   ----- Message from Ardelle Anton sent at 04/27/2018 10:58 AM EDT -----  Patient came in for labs that he thought Dr. Acie Fredrickson ordered yesterday. No orders in chart. Patient has an appointment 05/18/18 with Dr. Debara Pickett. Please call patient and let him know if he needs to come back for labs before his appointment.    Dr. Acie Fredrickson and Sharyn Lull RN, the message above came from Registration.  What labs would you like for this pt to have done, prior to seeing Dr. Debara Pickett on 05/18/18?  Didn't see any orders in your OV from yesterday or in active request, that were not expired.   Please advise, and triage can arrange for the pt to come in for lab.  Thanks!

## 2018-04-27 NOTE — Telephone Encounter (Signed)
Pt needs BMP, liver enz, lipid profile

## 2018-04-28 ENCOUNTER — Encounter (HOSPITAL_COMMUNITY)
Admission: RE | Admit: 2018-04-28 | Discharge: 2018-04-28 | Disposition: A | Discharge status: Home / Self Care | Payer: Self-pay | Source: Ambulatory Visit | Attending: Cardiovascular Disease | Admitting: Cardiovascular Disease

## 2018-04-28 NOTE — Telephone Encounter (Signed)
Left detailed message on patient's voice mail that lab work needs to be obtained prior to appointment on 10/24 with Dr. Debara Pickett. I advised patient to come in for lab on 10/18 or to call back to reschedule that date if needed.

## 2018-05-01 ENCOUNTER — Encounter (HOSPITAL_COMMUNITY): Payer: Self-pay

## 2018-05-01 DIAGNOSIS — M978XXD Periprosthetic fracture around other internal prosthetic joint, subsequent encounter: Secondary | ICD-10-CM | POA: Diagnosis not present

## 2018-05-01 DIAGNOSIS — M9902 Segmental and somatic dysfunction of thoracic region: Secondary | ICD-10-CM | POA: Diagnosis not present

## 2018-05-01 DIAGNOSIS — M542 Cervicalgia: Secondary | ICD-10-CM | POA: Diagnosis not present

## 2018-05-01 DIAGNOSIS — M546 Pain in thoracic spine: Secondary | ICD-10-CM | POA: Diagnosis not present

## 2018-05-03 ENCOUNTER — Encounter (HOSPITAL_COMMUNITY)
Admission: RE | Admit: 2018-05-03 | Discharge: 2018-05-03 | Disposition: A | Discharge status: Home / Self Care | Payer: Self-pay | Source: Ambulatory Visit | Attending: Cardiovascular Disease | Admitting: Cardiovascular Disease

## 2018-05-05 ENCOUNTER — Encounter (HOSPITAL_COMMUNITY): Payer: Self-pay

## 2018-05-08 ENCOUNTER — Encounter (HOSPITAL_COMMUNITY): Payer: Self-pay

## 2018-05-10 ENCOUNTER — Encounter (HOSPITAL_COMMUNITY)
Admission: RE | Admit: 2018-05-10 | Discharge: 2018-05-10 | Disposition: A | Discharge status: Home / Self Care | Payer: BLUE CROSS/BLUE SHIELD | Source: Ambulatory Visit | Attending: Cardiovascular Disease | Admitting: Cardiovascular Disease

## 2018-05-12 ENCOUNTER — Encounter (HOSPITAL_COMMUNITY): Payer: Self-pay

## 2018-05-12 ENCOUNTER — Other Ambulatory Visit: Payer: BLUE CROSS/BLUE SHIELD | Admitting: *Deleted

## 2018-05-12 DIAGNOSIS — I2510 Atherosclerotic heart disease of native coronary artery without angina pectoris: Secondary | ICD-10-CM

## 2018-05-12 DIAGNOSIS — E785 Hyperlipidemia, unspecified: Secondary | ICD-10-CM

## 2018-05-12 LAB — LIPID PANEL
CHOLESTEROL TOTAL: 240 mg/dL — AB (ref 100–199)
Chol/HDL Ratio: 6.3 ratio — ABNORMAL HIGH (ref 0.0–5.0)
HDL: 38 mg/dL — ABNORMAL LOW (ref >39)
LDL CALC: 174 mg/dL — AB (ref 0–99)
Triglycerides: 141 mg/dL (ref 0–149)
VLDL Cholesterol Cal: 28 mg/dL (ref 5–40)

## 2018-05-12 LAB — HEPATIC FUNCTION PANEL
ALK PHOS: 66 IU/L (ref 39–117)
ALT: 17 IU/L (ref 0–44)
AST: 15 IU/L (ref 0–40)
Albumin: 4.8 g/dL (ref 3.5–5.5)
Bilirubin Total: 0.5 mg/dL (ref 0.0–1.2)
Bilirubin, Direct: 0.11 mg/dL (ref 0.00–0.40)
Total Protein: 6.8 g/dL (ref 6.0–8.5)

## 2018-05-12 LAB — BASIC METABOLIC PANEL
BUN / CREAT RATIO: 12 (ref 9–20)
BUN: 16 mg/dL (ref 6–24)
CO2: 23 mmol/L (ref 20–29)
Calcium: 9.6 mg/dL (ref 8.7–10.2)
Chloride: 101 mmol/L (ref 96–106)
Creatinine, Ser: 1.34 mg/dL — ABNORMAL HIGH (ref 0.76–1.27)
GFR, EST AFRICAN AMERICAN: 73 mL/min/{1.73_m2} (ref >59)
GFR, EST NON AFRICAN AMERICAN: 63 mL/min/{1.73_m2} (ref >59)
Glucose: 94 mg/dL (ref 65–99)
POTASSIUM: 3.9 mmol/L (ref 3.5–5.2)
Sodium: 141 mmol/L (ref 134–144)

## 2018-05-15 ENCOUNTER — Encounter (HOSPITAL_COMMUNITY): Payer: Self-pay

## 2018-05-17 ENCOUNTER — Encounter (HOSPITAL_COMMUNITY)
Admission: RE | Admit: 2018-05-17 | Discharge: 2018-05-17 | Disposition: A | Discharge status: Home / Self Care | Payer: Self-pay | Source: Ambulatory Visit | Attending: Cardiovascular Disease | Admitting: Cardiovascular Disease

## 2018-05-18 ENCOUNTER — Ambulatory Visit: Payer: BLUE CROSS/BLUE SHIELD | Admitting: Cardiovascular Disease

## 2018-05-18 ENCOUNTER — Encounter: Payer: Self-pay | Admitting: Internal Medicine

## 2018-05-18 ENCOUNTER — Ambulatory Visit (INDEPENDENT_AMBULATORY_CARE_PROVIDER_SITE_OTHER): Payer: BLUE CROSS/BLUE SHIELD | Admitting: Internal Medicine

## 2018-05-18 VITALS — BP 112/84 | HR 88 | Ht 71.0 in | Wt 176.6 lb

## 2018-05-18 DIAGNOSIS — Z789 Other specified health status: Secondary | ICD-10-CM | POA: Insufficient documentation

## 2018-05-18 DIAGNOSIS — E7849 Other hyperlipidemia: Secondary | ICD-10-CM | POA: Insufficient documentation

## 2018-05-18 DIAGNOSIS — I2510 Atherosclerotic heart disease of native coronary artery without angina pectoris: Secondary | ICD-10-CM

## 2018-05-18 DIAGNOSIS — E785 Hyperlipidemia, unspecified: Secondary | ICD-10-CM | POA: Diagnosis not present

## 2018-05-18 NOTE — Patient Instructions (Signed)
Medication Instructions:  Dr. Debara Pickett recommends Praluent 75mg /mL (PCSK9). This is an injectable cholesterol medication. This medication will need prior approval with your insurance company, which we will work on. If the medication is not approved initially, we may need to do an appeal with your insurance. We will keep you updated on this process. This medication can be provided at some local pharmacies or be shipped to you from a specialty pharmacy.   If you need a refill on your cardiac medications before your next appointment, please call your pharmacy.   Lab work: FASTING lab work in 3-4 months to check cholesterol If you have labs (blood work) drawn today and your tests are completely normal, you will receive your results only by: Marland Kitchen MyChart Message (if you have MyChart) OR . A paper copy in the mail If you have any lab test that is abnormal or we need to change your treatment, we will call you to review the results.  Testing/Procedures: NONE  Follow-Up: At [facility], you and your health needs are our priority.  As part of our continuing mission to provide you with exceptional heart care, we have created designated Provider Care Teams.  These Care Teams include your primary Cardiologist (physician) and Advanced Practice Providers (APPs -  Physician Assistants and Nurse Practitioners) who all work together to provide you with the care you need, when you need it. . You will need a follow up appointment in 3-4 months with Dr. Debara Pickett (lipid clinic)  Any Other Special Instructions Will Be Listed Below (If Applicable).

## 2018-05-18 NOTE — Progress Notes (Signed)
OFFICE CONSULT NOTE  Chief Complaint:  Elevated cholesterol  Primary Care Physician: Lujean Amel, MD  HPI:  Charles Montoya is a 47 y.o. male who is being seen today for the evaluation of elevated cholesterol at the request of Liam Rogers, MD. This is a pleasant 47 year old male who unfortunately had early onset heart disease.  In June 2018 he underwent cardiac catheterization and required a stent to the PDA but was also noted to have mild to moderate multivessel disease.  He has a long-standing smoking history but quit smoking at this event.  He also has significant anxiety history.  Family story significant for his father who had heart disease and underwent heart surgery in his 69s.  He is grandfather on his father's side also had coronary disease.  He does not have any children.  Lipid profile in 2018 showed total cholesterol of 253.  April be 155, LDL particle #2334, with predominantly small LDL and an increased small LDL particle number of 1311.  Triglycerides 186.  LDL-C of 180.  He has subsequently been placed on Lipitor 40 mg without significant improvement in his numbers and that dose was then increased up to 80 mg.  He had already started to experience some side effects from atorvastatin however on the 80 mg dose had significant fatigue and achiness to the point where he had trouble getting out of bed.  He was taken off that and his symptoms resolved.  Subsequently he was tried on rosuvastatin with similar side effects again improved after discontinuing the medication.  Finally he was on simvastatin.  Again he had recurrent myalgias.  He was then subsequently started appropriately on Repatha which he took for several months.  His last lipid profile in April 2019 on medication showed marked improvement in his LDL particle numbers down from 2294-898.  LDL-C dropped from 166-71.  This represented an excellent response to therapy, however after a couple of months on injections he again started to  feel significant fatigue, lethargy and soreness with symptoms that were much worse than with the statins.  He was then referred to the lipid clinic for additional treatment options.  PMHx:  Past Medical History:  Diagnosis Date  . Anxiety   . CAD (coronary artery disease)    a. cath 12/2016--> s/p DES to RPDA, 50% dRCA, 25% pLAD, 40% mLAD, 25% pro to mid CX  . Tobacco abuse     Past Surgical History:  Procedure Laterality Date  . CARDIAC CATHETERIZATION    . CORONARY STENT INTERVENTION N/A 01/19/2017   Procedure: Coronary Stent Intervention;  Surgeon: Jettie Booze, MD;  Location: [place] [facility];  Service: Cardiovascular;  Laterality: N/A;  . HERNIA REPAIR    . LEFT HEART CATH AND CORONARY ANGIOGRAPHY N/A 01/19/2017   Procedure: Left Heart Cath and Coronary Angiography;  Surgeon: Jettie Booze, MD;  Location: [place] [facility];  Service: Cardiovascular;  Laterality: N/A;    FAMHx:  Family History  Problem Relation Age of Onset  . CAD Father   . Hypertension Mother     SOCHx:   reports that he quit smoking about 15 months ago. His smoking use included cigarettes. He smoked 1.50 packs per day. He has never used smokeless tobacco. He reports that he does not drink alcohol or use drugs.  ALLERGIES:  Allergies  Allergen Reactions  . Codeine Itching  . Repatha [Evolocumab]     Cause pain    ROS: Pertinent items noted in HPI  and remainder of comprehensive ROS otherwise negative.  HOME MEDS: Current Outpatient Medications on File Prior to Visit  Medication Sig Dispense Refill  . Alirocumab (PRALUENT) 75 MG/ML SOPN Inject 1 Dose into the skin every 14 (fourteen) days.    . ALPRAZolam (XANAX) 0.5 MG tablet Take 0.5 mg by mouth 3 (three) times daily as needed for anxiety. Anxiety     . clomiPRAMINE (ANAFRANIL) 25 MG capsule Take 25 mg by mouth at bedtime.    . clotrimazole-betamethasone (LOTRISONE) cream Apply 1 application topically daily.  0  . cyclobenzaprine  (FLEXERIL) 10 MG tablet Take 1 tablet (10 mg total) by mouth 2 (two) times daily as needed for muscle spasms. 20 tablet 0  . nicotine polacrilex (NICORETTE) 4 MG gum Take 4 mg by mouth as needed for smoking cessation.    . nitroGLYCERIN (NITROSTAT) 0.4 MG SL tablet Place 1 tablet (0.4 mg total) under the tongue every 5 (five) minutes as needed for chest pain. 25 tablet 3  . ticagrelor (BRILINTA) 60 MG TABS tablet Take 1 tablet (60 mg total) by mouth 2 (two) times daily. 60 tablet 11   No current facility-administered medications on file prior to visit.     LABS/IMAGING: No results found for this or any previous visit (from the past 48 hour(s)). No results found.  LIPID PANEL:    Component Value Date/Time   CHOL 240 (H) 05/12/2018 1118   TRIG 141 05/12/2018 1118   TRIG 163 (H) 06/07/2017 0918   HDL 38 (L) 05/12/2018 1118   HDL 34 (L) 06/07/2017 0918   CHOLHDL 6.3 (H) 05/12/2018 1118   CHOLHDL 6.6 01/19/2017 0845   VLDL 48 (H) 01/19/2017 0845   LDLCALC 174 (H) 05/12/2018 1118    WEIGHTS: Wt Readings from Last 3 Encounters:  05/18/18 176 lb 9.6 oz (80.1 kg)  04/26/18 182 lb (82.6 kg)  11/08/17 180 lb (81.6 kg)    VITALS: BP 112/84   Pulse 88   Ht 5\' 11"  (1.803 m)   Wt 176 lb 9.6 oz (80.1 kg)   SpO2 99%   BMI 24.63 kg/m   EXAM: General appearance: alert and no distress Neck: no carotid bruit, no JVD and thyroid not enlarged, symmetric, no tenderness/mass/nodules Lungs: clear to auscultation bilaterally Heart: regular rate and rhythm, S1, S2 normal, no murmur, click, rub or gallop Abdomen: soft, non-tender; bowel sounds normal; no masses,  no organomegaly Extremities: extremities normal, atraumatic, no cyanosis or edema Pulses: 2+ and symmetric Skin: Skin color, texture, turgor normal. No rashes or lesions Neurologic: Grossly normal Psych: Pleasant  EKG: Deferred  ASSESSMENT: 1. CAD s/p PCI to the rPDA (2018) 2. 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 Statin  intolerance 4. Intolerant to [place] (but responded appropriately)  PLAN: 1.   Mr. Brissett has a history of statin intolerance to 3 statins at high intensity doses.  Unfortunately he is recently been intolerant to [place] although had good clinical response to the medication.  Since stopping the Repatha his symptoms have resolved however his cholesterol has rebounded from an LDL 68 up to 174.  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  His options are now more limited for treatment however and we are targeting LDL less than 70.  I would recommend that we trial Praluent as an option.  I would recommend starting with the lower 75 mg twice monthly dose.  Hopefully if this is tolerated and gets him close to his goal we  could consider adding ezetimibe or increasing to the higher dose.  Another option would be participation in the [place] clinical trial of Inclisirin (siRNA to PCSK9) - however, this is placebo-controlled and this must be considered.  Thanks for the kind referral - will repeat lipids and see him back in 3 months.  Pixie Casino, MD, [facility], [place] Director of the Advanced Lipid Disorders &  Cardiovascular Risk Reduction Clinic  Diplomate of the American Board of Clinical Lipidology Attending Cardiologist  Direct Dial: [phone]  Fax: [phone]  Website:  [email or web address] 05/18/2018, 12:38 PM

## 2018-05-19 ENCOUNTER — Encounter (HOSPITAL_COMMUNITY)
Admission: RE | Admit: 2018-05-19 | Discharge: 2018-05-19 | Disposition: A | Discharge status: Home / Self Care | Payer: Self-pay | Source: Ambulatory Visit | Attending: Cardiovascular Disease | Admitting: Cardiovascular Disease

## 2018-05-19 ENCOUNTER — Telehealth: Payer: Self-pay | Admitting: Internal Medicine

## 2018-05-19 NOTE — Telephone Encounter (Signed)
Prior authorization for praluent 75mg /ml submitted via [email or web address]  Key: [id number]

## 2018-05-22 ENCOUNTER — Other Ambulatory Visit: Payer: Self-pay | Admitting: Interventional Cardiology

## 2018-05-22 ENCOUNTER — Encounter (HOSPITAL_COMMUNITY): Payer: Self-pay

## 2018-05-24 ENCOUNTER — Encounter (HOSPITAL_COMMUNITY): Payer: Self-pay

## 2018-05-24 DIAGNOSIS — M9902 Segmental and somatic dysfunction of thoracic region: Secondary | ICD-10-CM | POA: Diagnosis not present

## 2018-05-24 DIAGNOSIS — M542 Cervicalgia: Secondary | ICD-10-CM | POA: Diagnosis not present

## 2018-05-24 DIAGNOSIS — M546 Pain in thoracic spine: Secondary | ICD-10-CM | POA: Diagnosis not present

## 2018-05-24 DIAGNOSIS — M978XXD Periprosthetic fracture around other internal prosthetic joint, subsequent encounter: Secondary | ICD-10-CM | POA: Diagnosis not present

## 2018-05-24 NOTE — Telephone Encounter (Signed)
Received fax from [facility] that additional info to process prior authorization is needed. Form completed and will be sent in once signed by MD.   Patient has ASCVD - h/o MI, coronary stent 12/2016; dutch score = 4; tried/failed atorvastatin, simvastatin, rosuvastatin; LDL >/= 70mg L YES (174 October 2019); failed Repatha d/t SE of lethargy, soreness, fatigue

## 2018-05-26 ENCOUNTER — Encounter (HOSPITAL_COMMUNITY)
Admission: RE | Admit: 2018-05-26 | Discharge: 2018-05-26 | Disposition: A | Discharge status: Home / Self Care | Payer: Self-pay | Source: Ambulatory Visit | Attending: Cardiovascular Disease | Admitting: Cardiovascular Disease

## 2018-05-26 DIAGNOSIS — I25110 Atherosclerotic heart disease of native coronary artery with unstable angina pectoris: Secondary | ICD-10-CM | POA: Insufficient documentation

## 2018-05-29 ENCOUNTER — Encounter (HOSPITAL_COMMUNITY)
Admission: RE | Admit: 2018-05-29 | Discharge: 2018-05-29 | Disposition: A | Discharge status: Home / Self Care | Payer: Self-pay | Source: Ambulatory Visit | Attending: Cardiovascular Disease | Admitting: Cardiovascular Disease

## 2018-05-29 MED ORDER — ALIROCUMAB 75 MG/ML ~~LOC~~ SOPN: 1.0000 | PEN_INJECTOR | SUBCUTANEOUS | 11 refills | Status: DC

## 2018-05-29 NOTE — Telephone Encounter (Addendum)
Received notice from [place] dated 05/26/18 that Praluent 75mg /mL has been approved from 05/19/2018 - 05/18/2019.   Approved NDCs: [phone] and [phone]  Patient notified via [place].   Rx sent to [facility]

## 2018-05-29 NOTE — Addendum Note (Signed)
Addended by: Fidel Levy on: 05/29/2018 10:59 AM   Modules accepted: Orders

## 2018-05-31 ENCOUNTER — Encounter (HOSPITAL_COMMUNITY): Payer: Self-pay

## 2018-06-01 NOTE — Telephone Encounter (Signed)
Faxed PA info to [facility] @[phone] with copy of approval letter

## 2018-06-01 NOTE — Telephone Encounter (Signed)
Per [facility], they are receiving a notice that a PA is required for Praluent, despite an approval notice which I communicated to them. Will follow up on this.

## 2018-06-02 ENCOUNTER — Encounter (HOSPITAL_COMMUNITY)
Admission: RE | Admit: 2018-06-02 | Discharge: 2018-06-02 | Disposition: A | Discharge status: Home / Self Care | Payer: Self-pay | Source: Ambulatory Visit | Attending: Cardiovascular Disease | Admitting: Cardiovascular Disease

## 2018-06-05 ENCOUNTER — Encounter (HOSPITAL_COMMUNITY): Payer: Self-pay

## 2018-06-06 MED ORDER — ALIROCUMAB 75 MG/ML ~~LOC~~ SOAJ: 1.0000 | SUBCUTANEOUS | 11 refills | Status: DC

## 2018-06-06 NOTE — Addendum Note (Signed)
Addended by: Fidel Levy on: 06/06/2018 12:40 PM   Modules accepted: Orders

## 2018-06-06 NOTE — Telephone Encounter (Signed)
Spoke with [employer]. They cannot order the approved NDCs for Praluent, thus patient will need to use a different pharmacy.

## 2018-06-06 NOTE — Telephone Encounter (Signed)
Patient would like Rx sent to [facility]. Rx(s) sent to pharmacy electronically.

## 2018-06-06 NOTE — Telephone Encounter (Signed)
Patient notified via [place]. Per [facility], [facility] can generally obtain/order approved NDCs for PCSK9

## 2018-06-06 NOTE — Telephone Encounter (Signed)
Received fax from [place] dated 06/01/18 that [place] has been approved from 05/19/18 - 05/18/19 Approved [place]: [id number], [phone]

## 2018-06-07 ENCOUNTER — Encounter (HOSPITAL_COMMUNITY)
Admission: RE | Admit: 2018-06-07 | Discharge: 2018-06-07 | Disposition: A | Discharge status: Home / Self Care | Payer: Self-pay | Source: Ambulatory Visit | Attending: Cardiovascular Disease | Admitting: Cardiovascular Disease

## 2018-06-09 ENCOUNTER — Encounter (HOSPITAL_COMMUNITY)
Admission: RE | Admit: 2018-06-09 | Discharge: 2018-06-09 | Disposition: A | Discharge status: Home / Self Care | Payer: Self-pay | Source: Ambulatory Visit | Attending: Cardiovascular Disease | Admitting: Cardiovascular Disease

## 2018-06-12 ENCOUNTER — Encounter (HOSPITAL_COMMUNITY): Payer: Self-pay

## 2018-06-14 ENCOUNTER — Encounter (HOSPITAL_COMMUNITY)
Admission: RE | Admit: 2018-06-14 | Discharge: 2018-06-14 | Disposition: A | Discharge status: Home / Self Care | Payer: Self-pay | Source: Ambulatory Visit | Attending: Cardiovascular Disease | Admitting: Cardiovascular Disease

## 2018-06-16 ENCOUNTER — Encounter (HOSPITAL_COMMUNITY)
Admission: RE | Admit: 2018-06-16 | Discharge: 2018-06-16 | Disposition: A | Discharge status: Home / Self Care | Payer: Self-pay | Source: Ambulatory Visit | Attending: Cardiovascular Disease | Admitting: Cardiovascular Disease

## 2018-06-19 ENCOUNTER — Encounter (HOSPITAL_COMMUNITY): Payer: Self-pay

## 2018-06-21 ENCOUNTER — Encounter (HOSPITAL_COMMUNITY): Payer: Self-pay

## 2018-06-23 ENCOUNTER — Encounter (HOSPITAL_COMMUNITY): Payer: Self-pay

## 2018-06-26 ENCOUNTER — Encounter (HOSPITAL_COMMUNITY)
Admission: RE | Admit: 2018-06-26 | Discharge: 2018-06-26 | Disposition: A | Discharge status: Home / Self Care | Payer: Self-pay | Source: Ambulatory Visit | Attending: Cardiovascular Disease | Admitting: Cardiovascular Disease

## 2018-06-26 DIAGNOSIS — I25110 Atherosclerotic heart disease of native coronary artery with unstable angina pectoris: Secondary | ICD-10-CM | POA: Insufficient documentation

## 2018-06-28 ENCOUNTER — Encounter (HOSPITAL_COMMUNITY)
Admission: RE | Admit: 2018-06-28 | Discharge: 2018-06-28 | Disposition: A | Discharge status: Home / Self Care | Payer: Self-pay | Source: Ambulatory Visit | Attending: Cardiovascular Disease | Admitting: Cardiovascular Disease

## 2018-06-30 ENCOUNTER — Encounter (HOSPITAL_COMMUNITY): Payer: Self-pay

## 2018-07-03 ENCOUNTER — Encounter (HOSPITAL_COMMUNITY): Payer: Self-pay

## 2018-07-05 ENCOUNTER — Encounter (HOSPITAL_COMMUNITY)
Admission: RE | Admit: 2018-07-05 | Discharge: 2018-07-05 | Disposition: A | Discharge status: Home / Self Care | Payer: Self-pay | Source: Ambulatory Visit | Attending: Cardiovascular Disease | Admitting: Cardiovascular Disease

## 2018-07-07 ENCOUNTER — Encounter (HOSPITAL_COMMUNITY): Payer: Self-pay

## 2018-07-10 ENCOUNTER — Encounter (HOSPITAL_COMMUNITY)
Admission: RE | Admit: 2018-07-10 | Discharge: 2018-07-10 | Disposition: A | Discharge status: Home / Self Care | Payer: Self-pay | Source: Ambulatory Visit | Attending: Cardiovascular Disease | Admitting: Cardiovascular Disease

## 2018-07-12 ENCOUNTER — Encounter (HOSPITAL_COMMUNITY): Payer: Self-pay

## 2018-07-14 ENCOUNTER — Encounter (HOSPITAL_COMMUNITY): Payer: Self-pay

## 2018-07-17 ENCOUNTER — Encounter (HOSPITAL_COMMUNITY)
Admission: RE | Admit: 2018-07-17 | Discharge: 2018-07-17 | Disposition: A | Discharge status: Home / Self Care | Payer: Self-pay | Source: Ambulatory Visit | Attending: Cardiovascular Disease | Admitting: Cardiovascular Disease

## 2018-07-21 ENCOUNTER — Encounter (HOSPITAL_COMMUNITY): Payer: Self-pay

## 2018-07-24 ENCOUNTER — Encounter (HOSPITAL_COMMUNITY)
Admission: RE | Admit: 2018-07-24 | Discharge: 2018-07-24 | Disposition: A | Discharge status: Home / Self Care | Payer: Self-pay | Source: Ambulatory Visit | Attending: Cardiovascular Disease | Admitting: Cardiovascular Disease

## 2018-07-28 ENCOUNTER — Encounter (HOSPITAL_COMMUNITY): Payer: Self-pay

## 2018-07-28 DIAGNOSIS — I25110 Atherosclerotic heart disease of native coronary artery with unstable angina pectoris: Secondary | ICD-10-CM | POA: Insufficient documentation

## 2018-07-31 ENCOUNTER — Encounter (HOSPITAL_COMMUNITY)
Admission: RE | Admit: 2018-07-31 | Discharge: 2018-07-31 | Disposition: A | Discharge status: Home / Self Care | Payer: Self-pay | Source: Ambulatory Visit | Attending: Cardiovascular Disease | Admitting: Cardiovascular Disease

## 2018-08-02 ENCOUNTER — Encounter (HOSPITAL_COMMUNITY): Payer: Self-pay

## 2018-08-04 ENCOUNTER — Encounter (HOSPITAL_COMMUNITY): Payer: Self-pay

## 2018-08-07 ENCOUNTER — Encounter (HOSPITAL_COMMUNITY)
Admission: RE | Admit: 2018-08-07 | Discharge: 2018-08-07 | Disposition: A | Discharge status: Home / Self Care | Payer: BLUE CROSS/BLUE SHIELD | Source: Ambulatory Visit | Attending: Cardiovascular Disease | Admitting: Cardiovascular Disease

## 2018-08-09 ENCOUNTER — Encounter (HOSPITAL_COMMUNITY): Payer: Self-pay

## 2018-08-09 DIAGNOSIS — M9902 Segmental and somatic dysfunction of thoracic region: Secondary | ICD-10-CM | POA: Diagnosis not present

## 2018-08-09 DIAGNOSIS — M62838 Other muscle spasm: Secondary | ICD-10-CM | POA: Diagnosis not present

## 2018-08-09 DIAGNOSIS — M546 Pain in thoracic spine: Secondary | ICD-10-CM | POA: Diagnosis not present

## 2018-08-11 ENCOUNTER — Encounter (HOSPITAL_COMMUNITY): Payer: Self-pay

## 2018-08-14 ENCOUNTER — Encounter (HOSPITAL_COMMUNITY): Payer: Self-pay

## 2018-08-14 DIAGNOSIS — I2510 Atherosclerotic heart disease of native coronary artery without angina pectoris: Secondary | ICD-10-CM | POA: Diagnosis not present

## 2018-08-14 DIAGNOSIS — Z0000 Encounter for general adult medical examination without abnormal findings: Secondary | ICD-10-CM | POA: Diagnosis not present

## 2018-08-14 DIAGNOSIS — N529 Male erectile dysfunction, unspecified: Secondary | ICD-10-CM | POA: Diagnosis not present

## 2018-08-14 DIAGNOSIS — E7800 Pure hypercholesterolemia, unspecified: Secondary | ICD-10-CM | POA: Diagnosis not present

## 2018-08-16 ENCOUNTER — Encounter (HOSPITAL_COMMUNITY)
Admission: RE | Admit: 2018-08-16 | Discharge: 2018-08-16 | Disposition: A | Discharge status: Home / Self Care | Payer: Self-pay | Source: Ambulatory Visit | Attending: Cardiovascular Disease | Admitting: Cardiovascular Disease

## 2018-08-17 ENCOUNTER — Ambulatory Visit: Payer: BLUE CROSS/BLUE SHIELD | Admitting: Internal Medicine

## 2018-08-17 ENCOUNTER — Encounter: Payer: Self-pay | Admitting: Internal Medicine

## 2018-08-17 VITALS — BP 130/90 | HR 76 | Ht 70.5 in | Wt 185.0 lb

## 2018-08-17 DIAGNOSIS — I2510 Atherosclerotic heart disease of native coronary artery without angina pectoris: Secondary | ICD-10-CM | POA: Diagnosis not present

## 2018-08-17 DIAGNOSIS — Z789 Other specified health status: Secondary | ICD-10-CM

## 2018-08-17 DIAGNOSIS — E785 Hyperlipidemia, unspecified: Secondary | ICD-10-CM | POA: Diagnosis not present

## 2018-08-17 MED ORDER — ICOSAPENT ETHYL 1 G PO CAPS: 2.0000 g | ORAL_CAPSULE | Freq: Two times a day (BID) | ORAL | 5 refills | Status: DC

## 2018-08-17 NOTE — Progress Notes (Signed)
OFFICE CONSULT NOTE  Chief Complaint:  Follow-up dyslipidemia  Primary Care Physician: Lujean Amel, MD  HPI:  Charles Montoya is a 48 y.o. male who is being seen today for the evaluation of elevated cholesterol at the request of Liam Rogers, MD. This is a pleasant 48 year old male who unfortunately had early onset heart disease.  In June 2018 he underwent cardiac catheterization and required a stent to the PDA but was also noted to have mild to moderate multivessel disease.  He has a long-standing smoking history but quit smoking at this event.  He also has significant anxiety history.  Family story significant for his father who had heart disease and underwent heart surgery in his 38s.  He is grandfather on his father's side also had coronary disease.  He does not have any children.  Lipid profile in 2018 showed total cholesterol of 253.  April be 155, LDL particle #2334, with predominantly small LDL and an increased small LDL particle number of 1311.  Triglycerides 186.  LDL-C of 180.  He has subsequently been placed on Lipitor 40 mg without significant improvement in his numbers and that dose was then increased up to 80 mg.  He had already started to experience some side effects from atorvastatin however on the 80 mg dose had significant fatigue and achiness to the point where he had trouble getting out of bed.  He was taken off that and his symptoms resolved.  Subsequently he was tried on rosuvastatin with similar side effects again improved after discontinuing the medication.  Finally he was on simvastatin.  Again he had recurrent myalgias.  He was then subsequently started appropriately on Repatha which he took for several months.  His last lipid profile in April 2019 on medication showed marked improvement in his LDL particle numbers down from 2294-898.  LDL-C dropped from 166-71.  This represented an excellent response to therapy, however after a couple of months on injections he again started  to feel significant fatigue, lethargy and soreness with symptoms that were much worse than with the statins.  He was then referred to the lipid clinic for additional treatment options.  08/17/2018  Mr. Cordova returns today for follow-up of dyslipidemia.  He had repeat lipid testing 3 days ago which showed no significant improvement in his lipid profile.  Total cholesterol was 233, HDL 36, LDL 142 and triglycerides 275.  On fortunately as he was intolerant to Repatha, he was switched to Praluent.  He did have one home injection of Praluent and felt significant fatigue and lassisitude.  He said he fell into a deep depression and was fairly lethargic.  He attributed this to the medication and did not continue it.  At this point he is reluctant to take either PCSK9 inhibitor or statin medications.  LDL remains above goal and triglycerides are high as well given his history of coronary disease and PCI.  PMHx:  Past Medical History:  Diagnosis Date  . Anxiety   . CAD (coronary artery disease)    a. cath 12/2016--> s/p DES to RPDA, 50% dRCA, 25% pLAD, 40% mLAD, 25% pro to mid CX  . Tobacco abuse     Past Surgical History:  Procedure Laterality Date  . CARDIAC CATHETERIZATION    . CORONARY STENT INTERVENTION N/A 01/19/2017   Procedure: Coronary Stent Intervention;  Surgeon: Jettie Booze, MD;  Location: [place] [facility];  Service: Cardiovascular;  Laterality: N/A;  . HERNIA REPAIR    . LEFT HEART  CATH AND CORONARY ANGIOGRAPHY N/A 01/19/2017   Procedure: Left Heart Cath and Coronary Angiography;  Surgeon: Jettie Booze, MD;  Location: [place] [facility];  Service: Cardiovascular;  Laterality: N/A;    FAMHx:  Family History  Problem Relation Age of Onset  . CAD Father   . Hypertension Mother     SOCHx:   reports that he quit smoking about 18 months ago. His smoking use included cigarettes. He smoked 1.50 packs per day. He has never used smokeless tobacco. He reports that he does not  drink alcohol or use drugs.  ALLERGIES:  Allergies  Allergen Reactions  . Codeine Itching  . Repatha [Evolocumab]     Cause pain  . Statins Other (See Comments)    Atorvastatin, Rosuvastatin, Simvastatin - generalized fatigue, weakness, pain    ROS: Pertinent items noted in HPI and remainder of comprehensive ROS otherwise negative.  HOME MEDS: Current Outpatient Medications on File Prior to Visit  Medication Sig Dispense Refill  . Alirocumab (PRALUENT) 75 MG/ML SOAJ Inject 1 Dose into the skin every 14 (fourteen) days. 2 pen 11  . ALPRAZolam (XANAX) 0.5 MG tablet Take 0.5 mg by mouth 3 (three) times daily as needed for anxiety. Anxiety     . clomiPRAMINE (ANAFRANIL) 25 MG capsule Take 25 mg by mouth at bedtime.    . clotrimazole-betamethasone (LOTRISONE) cream Apply 1 application topically daily.  0  . cyclobenzaprine (FLEXERIL) 10 MG tablet Take 1 tablet (10 mg total) by mouth 2 (two) times daily as needed for muscle spasms. 20 tablet 0  . nicotine polacrilex (NICORETTE) 4 MG gum Take 4 mg by mouth as needed for smoking cessation.    . nitroGLYCERIN (NITROSTAT) 0.4 MG SL tablet 1 TAB UNDER TONGUE AS NEEDED FOR CHEST PAIN. MAY REPEAT EVERY 5 MIN FOR A TOTAL OF 3 DOSES. 25 tablet 6  . ticagrelor (BRILINTA) 60 MG TABS tablet Take 1 tablet (60 mg total) by mouth 2 (two) times daily. 60 tablet 11   No current facility-administered medications on file prior to visit.     LABS/IMAGING: No results found for this or any previous visit (from the past 48 hour(s)). No results found.  LIPID PANEL:    Component Value Date/Time   CHOL 240 (H) 05/12/2018 1118   TRIG 141 05/12/2018 1118   TRIG 163 (H) 06/07/2017 0918   HDL 38 (L) 05/12/2018 1118   HDL 34 (L) 06/07/2017 0918   CHOLHDL 6.3 (H) 05/12/2018 1118   CHOLHDL 6.6 01/19/2017 0845   VLDL 48 (H) 01/19/2017 0845   LDLCALC 174 (H) 05/12/2018 1118    WEIGHTS: Wt Readings from Last 3 Encounters:  08/17/18 185 lb (83.9 kg)    05/18/18 176 lb 9.6 oz (80.1 kg)  04/26/18 182 lb (82.6 kg)    VITALS: BP 130/90   Pulse 76   Ht 5' 10.5" (1.791 m)   Wt 185 lb (83.9 kg)   BMI 26.17 kg/m   EXAM: Deferred  EKG: Deferred  ASSESSMENT: 1. CAD s/p PCI to the rPDA (2018) 2. 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 Statin intolerance 4. Intolerant to PCKS9i  PLAN: 1.   Mr. Zaremba seems to now have had side effects to both PCSK9 inhibitors which were similar including significant fatigue, weakness and lethargy which was associated with some depression.  He is also intolerant to statins.  His LDL and triglycerides remain well above goal target.  At this point there is good evidence for treatment of elevated  triglycerides Vascepa.  I would recommend we pursue this and start 2 g twice daily.  He will also work on diet aggressively.  He may be a candidate for Inclisiran (si-RNA to PCSK9) which could be better tolerated, however this still is in clinical trials.  He is not interested in a trial at this time.  Follow-up with me in 6 months.  Pixie Casino, MD, [facility], [place] Director of the Advanced Lipid Disorders &  Cardiovascular Risk Reduction Clinic  Diplomate of the American Board of Clinical Lipidology Attending Cardiologist  Direct Dial: [phone]  Fax: [phone]  Website:  [email or web address] [email or web address] 08/17/2018, 11:13 AM

## 2018-08-17 NOTE — Patient Instructions (Signed)
Medication Instructions:  START vascepa 2 gram twice daily (4 total capsules per day)  [email or web address]  If you need a refill on your cardiac medications before your next appointment, please call your pharmacy.   Lab work: FASTING lab work in 3-4 months to assess cholesterol If you have labs (blood work) drawn today and your tests are completely normal, you will receive your results only by: Marland Kitchen MyChart Message (if you have MyChart) OR . A paper copy in the mail If you have any lab test that is abnormal or we need to change your treatment, we will call you to review the results.  Testing/Procedures: NONE  Follow-Up: At [facility], you and your health needs are our priority.  As part of our continuing mission to provide you with exceptional heart care, we have created designated Provider Care Teams.  These Care Teams include your primary Cardiologist (physician) and Advanced Practice Providers (APPs -  Physician Assistants and Nurse Practitioners) who all work together to provide you with the care you need, when you need it. . Follow up in 3-4 months with Dr. Debara Pickett ([place]) o Please have fasting blood work completed about 1 week prior to appointment  Any Other Special Instructions Will Be Listed Below (If Applicable).

## 2018-08-18 ENCOUNTER — Encounter (HOSPITAL_COMMUNITY)
Admission: RE | Admit: 2018-08-18 | Discharge: 2018-08-18 | Disposition: A | Discharge status: Home / Self Care | Payer: Self-pay | Source: Ambulatory Visit | Attending: Cardiovascular Disease | Admitting: Cardiovascular Disease

## 2018-08-21 ENCOUNTER — Encounter (HOSPITAL_COMMUNITY)
Admission: RE | Admit: 2018-08-21 | Discharge: 2018-08-21 | Disposition: A | Discharge status: Home / Self Care | Payer: Self-pay | Source: Ambulatory Visit | Attending: Cardiovascular Disease | Admitting: Cardiovascular Disease

## 2018-08-23 ENCOUNTER — Encounter (HOSPITAL_COMMUNITY): Payer: Self-pay

## 2018-08-24 DIAGNOSIS — M62838 Other muscle spasm: Secondary | ICD-10-CM | POA: Diagnosis not present

## 2018-08-24 DIAGNOSIS — M546 Pain in thoracic spine: Secondary | ICD-10-CM | POA: Diagnosis not present

## 2018-08-24 DIAGNOSIS — M9902 Segmental and somatic dysfunction of thoracic region: Secondary | ICD-10-CM | POA: Diagnosis not present

## 2018-08-25 ENCOUNTER — Encounter (HOSPITAL_COMMUNITY): Payer: Self-pay

## 2018-08-28 ENCOUNTER — Encounter (HOSPITAL_COMMUNITY): Payer: Self-pay

## 2018-08-28 DIAGNOSIS — I25110 Atherosclerotic heart disease of native coronary artery with unstable angina pectoris: Secondary | ICD-10-CM | POA: Insufficient documentation

## 2018-08-30 ENCOUNTER — Encounter (HOSPITAL_COMMUNITY)
Admission: RE | Admit: 2018-08-30 | Discharge: 2018-08-30 | Disposition: A | Discharge status: Home / Self Care | Payer: Self-pay | Source: Ambulatory Visit | Attending: Cardiovascular Disease | Admitting: Cardiovascular Disease

## 2018-09-01 ENCOUNTER — Encounter (HOSPITAL_COMMUNITY): Payer: Self-pay

## 2018-09-04 ENCOUNTER — Encounter (HOSPITAL_COMMUNITY)
Admission: RE | Admit: 2018-09-04 | Discharge: 2018-09-04 | Disposition: A | Discharge status: Home / Self Care | Payer: BLUE CROSS/BLUE SHIELD | Source: Ambulatory Visit | Attending: Cardiovascular Disease | Admitting: Cardiovascular Disease

## 2018-09-06 ENCOUNTER — Encounter (HOSPITAL_COMMUNITY): Payer: Self-pay

## 2018-09-08 ENCOUNTER — Encounter (HOSPITAL_COMMUNITY): Payer: Self-pay

## 2018-09-11 ENCOUNTER — Encounter (HOSPITAL_COMMUNITY): Payer: Self-pay

## 2018-09-13 ENCOUNTER — Encounter (HOSPITAL_COMMUNITY)
Admission: RE | Admit: 2018-09-13 | Discharge: 2018-09-13 | Disposition: A | Discharge status: Home / Self Care | Payer: Self-pay | Source: Ambulatory Visit | Attending: Cardiovascular Disease | Admitting: Cardiovascular Disease

## 2018-09-15 ENCOUNTER — Encounter (HOSPITAL_COMMUNITY): Payer: Self-pay

## 2018-09-18 ENCOUNTER — Encounter (HOSPITAL_COMMUNITY)
Admission: RE | Admit: 2018-09-18 | Discharge: 2018-09-18 | Disposition: A | Discharge status: Home / Self Care | Payer: Self-pay | Source: Ambulatory Visit | Attending: Cardiovascular Disease | Admitting: Cardiovascular Disease

## 2018-09-20 ENCOUNTER — Encounter (HOSPITAL_COMMUNITY): Payer: Self-pay

## 2018-09-22 ENCOUNTER — Encounter (HOSPITAL_COMMUNITY): Payer: Self-pay

## 2018-09-25 ENCOUNTER — Encounter (HOSPITAL_COMMUNITY)
Admission: RE | Admit: 2018-09-25 | Discharge: 2018-09-25 | Disposition: A | Discharge status: Home / Self Care | Payer: Self-pay | Source: Ambulatory Visit | Attending: Cardiovascular Disease | Admitting: Cardiovascular Disease

## 2018-09-25 DIAGNOSIS — I25110 Atherosclerotic heart disease of native coronary artery with unstable angina pectoris: Secondary | ICD-10-CM | POA: Insufficient documentation

## 2018-09-27 ENCOUNTER — Encounter (HOSPITAL_COMMUNITY): Payer: Self-pay

## 2018-09-29 ENCOUNTER — Encounter (HOSPITAL_COMMUNITY): Payer: Self-pay

## 2018-10-02 ENCOUNTER — Encounter (HOSPITAL_COMMUNITY): Payer: Self-pay

## 2018-10-04 ENCOUNTER — Encounter (HOSPITAL_COMMUNITY): Payer: Self-pay

## 2018-10-06 ENCOUNTER — Encounter (HOSPITAL_COMMUNITY): Payer: Self-pay

## 2018-10-09 ENCOUNTER — Encounter (HOSPITAL_COMMUNITY): Payer: Self-pay

## 2018-10-09 ENCOUNTER — Telehealth (HOSPITAL_COMMUNITY): Payer: Self-pay | Admitting: *Deleted

## 2018-10-09 NOTE — Telephone Encounter (Signed)
Contacted patient to notify of Cardiac Rehab department closure x2 weeks. Message left on patient's voicemail.   Jaya Lapka, Exercise Physiologist Cardiac and Pulmonary Rehabilitation 

## 2018-10-11 ENCOUNTER — Encounter (HOSPITAL_COMMUNITY): Payer: Self-pay

## 2018-10-13 ENCOUNTER — Encounter (HOSPITAL_COMMUNITY): Payer: Self-pay

## 2018-10-16 ENCOUNTER — Encounter (HOSPITAL_COMMUNITY): Payer: Self-pay

## 2018-10-16 DIAGNOSIS — F4010 Social phobia, unspecified: Secondary | ICD-10-CM | POA: Diagnosis not present

## 2018-10-17 ENCOUNTER — Telehealth (HOSPITAL_COMMUNITY): Payer: Self-pay | Admitting: *Deleted

## 2018-10-17 NOTE — Telephone Encounter (Signed)
Called to notify patient that the cardiac and pulmonary rehabilitation department will be closed for 4 weeks due to COVID-19 restrictions. Message left on voicemail. Sol Passer, MS, ACSM CEP

## 2018-10-18 ENCOUNTER — Encounter (HOSPITAL_COMMUNITY): Payer: Self-pay

## 2018-10-20 ENCOUNTER — Encounter (HOSPITAL_COMMUNITY): Payer: Self-pay

## 2018-10-23 ENCOUNTER — Encounter (HOSPITAL_COMMUNITY): Payer: Self-pay

## 2018-10-25 ENCOUNTER — Encounter (HOSPITAL_COMMUNITY): Payer: Self-pay

## 2018-10-27 ENCOUNTER — Encounter (HOSPITAL_COMMUNITY): Payer: Self-pay

## 2018-11-03 ENCOUNTER — Telehealth (HOSPITAL_COMMUNITY): Payer: Self-pay | Admitting: *Deleted

## 2018-11-03 NOTE — Telephone Encounter (Signed)
Called to notify patient that the cardiac and pulmonary rehabilitation department remains closed at this time due to COVID-19 restrictions. Message left on voicemail.   Sol Passer, MS, ACSM CEP 11/03/2018 [phone]

## 2018-11-08 ENCOUNTER — Ambulatory Visit: Payer: BLUE CROSS/BLUE SHIELD | Admitting: Internal Medicine

## 2018-12-21 ENCOUNTER — Emergency Department (HOSPITAL_COMMUNITY)
Admission: EM | Admit: 2018-12-21 | Discharge: 2018-12-21 | Disposition: A | Discharge status: Home / Self Care | Payer: BLUE CROSS/BLUE SHIELD | Attending: Emergency Medicine | Admitting: Emergency Medicine

## 2018-12-21 ENCOUNTER — Other Ambulatory Visit: Payer: Self-pay

## 2018-12-21 ENCOUNTER — Encounter (HOSPITAL_COMMUNITY): Payer: Self-pay | Admitting: Emergency Medicine

## 2018-12-21 DIAGNOSIS — Z79899 Other long term (current) drug therapy: Secondary | ICD-10-CM | POA: Insufficient documentation

## 2018-12-21 DIAGNOSIS — Y999 Unspecified external cause status: Secondary | ICD-10-CM | POA: Insufficient documentation

## 2018-12-21 DIAGNOSIS — Y92009 Unspecified place in unspecified non-institutional (private) residence as the place of occurrence of the external cause: Secondary | ICD-10-CM | POA: Diagnosis not present

## 2018-12-21 DIAGNOSIS — S61211A Laceration without foreign body of left index finger without damage to nail, initial encounter: Secondary | ICD-10-CM | POA: Diagnosis not present

## 2018-12-21 DIAGNOSIS — Y939 Activity, unspecified: Secondary | ICD-10-CM | POA: Diagnosis not present

## 2018-12-21 DIAGNOSIS — Z23 Encounter for immunization: Secondary | ICD-10-CM | POA: Insufficient documentation

## 2018-12-21 DIAGNOSIS — W298XXA Contact with other powered powered hand tools and household machinery, initial encounter: Secondary | ICD-10-CM | POA: Insufficient documentation

## 2018-12-21 DIAGNOSIS — I2510 Atherosclerotic heart disease of native coronary artery without angina pectoris: Secondary | ICD-10-CM | POA: Diagnosis not present

## 2018-12-21 MED ORDER — TETANUS-DIPHTH-ACELL PERTUSSIS 5-2.5-18.5 LF-MCG/0.5 IM SUSP
0.5000 mL | Freq: Once | INTRAMUSCULAR | Status: AC
  Administered 2018-12-21: 0.5 mL via INTRAMUSCULAR
  Filled 2018-12-21: qty 0.5

## 2018-12-21 MED ORDER — LIDOCAINE HCL (PF) 1 % IJ SOLN
5.0000 mL | Freq: Once | INTRAMUSCULAR | Status: AC
  Administered 2018-12-21: 5 mL via INTRADERMAL
  Filled 2018-12-21: qty 30

## 2018-12-21 NOTE — ED Triage Notes (Signed)
Per patient, had a drill bit puncture his left pointer finger-bleeding controlled at this time

## 2018-12-21 NOTE — Discharge Instructions (Addendum)
WOUND CARE °Please have your stitches/staples removed in 8--10 days or sooner if you have concerns. You may do this at any available urgent care or at your primary care doctor's office. ° Keep area clean and dry for 24 hours. Do not remove °bandage, if applied. ° After 24 hours, remove bandage and wash wound °gently with mild soap and warm water. Reapply °a new bandage after cleaning wound, if directed. ° Continue daily cleansing with soap and water until °stitches/staples are removed. ° Do not apply any ointments or creams to the wound °while stitches/staples are in place, as this may cause °delayed healing. ° Seek medical careif you experience any of the following °signs of infection: Swelling, redness, pus drainage, °streaking, fever >101.0 F ° Seek care if you experience excessive bleeding °that does not stop after 15-20 minutes of constant, firm °pressure. ° ° °

## 2018-12-21 NOTE — ED Provider Notes (Signed)
[place] DEPT Provider Note   CSN: [id number] Arrival date & time: 12/21/18  1725    History   Chief Complaint Chief Complaint  Patient presents with  . finger wound    HPI Charles Montoya is a 48 y.o. male.     The history is provided by the patient. No language interpreter was used.  Laceration  Location:  Hand Hand laceration location:  L fingers (dorsal left index finger over the PIP) Length:  1.5 Depth:  Cutaneous Quality: avulsion   Bleeding: controlled   Time since incident:  40 minutes Laceration mechanism:  Metal edge (Finger hit by drill) Pain details:    Quality:  Throbbing and aching   Severity:  Moderate   Timing:  Constant   Progression:  Improving Foreign body present:  No foreign bodies Relieved by:  Pressure Worsened by:  Movement Tetanus status:  Out of date Associated symptoms: no fever, no focal weakness, no numbness, no rash, no redness, no swelling and no streaking     Past Medical History:  Diagnosis Date  . Anxiety   . CAD (coronary artery disease)    a. cath 12/2016--> s/p DES to RPDA, 50% dRCA, 25% pLAD, 40% mLAD, 25% pro to mid CX  . Tobacco abuse     Patient Active Problem List   Diagnosis Date Noted  . Familial hyperlipidemia 05/18/2018  . Statin intolerance 05/18/2018  . Mixed hyperlipidemia 02/01/2017  . Cardiac enzymes elevated   . Coronary artery disease involving native coronary artery of native heart without angina pectoris   . Chest pain 01/18/2017  . Tobacco abuse 01/18/2017    Past Surgical History:  Procedure Laterality Date  . CARDIAC CATHETERIZATION    . CORONARY STENT INTERVENTION N/A 01/19/2017   Procedure: Coronary Stent Intervention;  Surgeon: Jettie Booze, MD;  Location: [place] [facility];  Service: Cardiovascular;  Laterality: N/A;  . HERNIA REPAIR    . LEFT HEART CATH AND CORONARY ANGIOGRAPHY N/A 01/19/2017   Procedure: Left Heart Cath and Coronary Angiography;   Surgeon: Jettie Booze, MD;  Location: [place] [facility];  Service: Cardiovascular;  Laterality: N/A;        Home Medications    Prior to Admission medications   Medication Sig Start Date End Date Taking? Authorizing Provider  ALPRAZolam Duanne Moron) 0.5 MG tablet Take 0.5 mg by mouth 3 (three) times daily as needed for anxiety. Anxiety     [provider]  clomiPRAMINE (ANAFRANIL) 25 MG capsule Take 25 mg by mouth at bedtime.    [provider]  clotrimazole-betamethasone (LOTRISONE) cream Apply 1 application topically daily. 08/10/17   [provider]  cyclobenzaprine (FLEXERIL) 10 MG tablet Take 1 tablet (10 mg total) by mouth 2 (two) times daily as needed for muscle spasms. 08/01/17   Montine Circle, PA-C  Icosapent Ethyl (VASCEPA) 1 g CAPS Take 2 capsules (2 g total) by mouth 2 (two) times daily. 08/17/18   Hilty, Nadean Corwin, MD  nicotine polacrilex (NICORETTE) 4 MG gum Take 4 mg by mouth as needed for smoking cessation.    [provider]  nitroGLYCERIN (NITROSTAT) 0.4 MG SL tablet 1 TAB UNDER TONGUE AS NEEDED FOR CHEST PAIN. MAY REPEAT EVERY 5 MIN FOR A TOTAL OF 3 DOSES. 05/23/18   Jettie Booze, MD  ticagrelor (BRILINTA) 60 MG TABS tablet Take 1 tablet (60 mg total) by mouth 2 (two) times daily. 04/26/18   Nahser, Wonda Cheng, MD  Family History Family History  Problem Relation Age of Onset  . CAD Father   . Hypertension Mother     Social History Social History   Tobacco Use  . Smoking status: Former Smoker    Packs/day: 1.50    Types: Cigarettes    Last attempt to quit: 01/19/2017    Years since quitting: 1.9  . Smokeless tobacco: Never Used  . Tobacco comment: Using nicoderm gum  Substance Use Topics  . Alcohol use: No  . Drug use: No     Allergies   Codeine; Praluent [alirocumab]; Repatha [evolocumab]; and Statins   Review of Systems Review of Systems  Constitutional: Negative for fever.  Skin: Negative for rash.   Neurological: Negative for focal weakness.     Physical Exam Updated Vital Signs BP (!) 140/106   Pulse 86   Temp 98.4 F (36.9 C) (Oral)   Resp 18   SpO2 97%   Physical Exam Vitals signs and nursing note reviewed.  Constitutional:      General: He is not in acute distress.    Appearance: He is well-developed. He is not diaphoretic.  HENT:     Head: Normocephalic and atraumatic.  Eyes:     General: No scleral icterus.    Conjunctiva/sclera: Conjunctivae normal.  Neck:     Musculoskeletal: Normal range of motion and neck supple.  Cardiovascular:     Rate and Rhythm: Normal rate and regular rhythm.     Heart sounds: Normal heart sounds.  Pulmonary:     Effort: Pulmonary effort is normal. No respiratory distress.     Breath sounds: Normal breath sounds.  Abdominal:     Palpations: Abdomen is soft.     Tenderness: There is no abdominal tenderness.  Musculoskeletal:     Comments: 1.5 cm jagged, stellate laceration of the left dorsal surface of the index finger over the PIP.  Small arterial with pulsatile bleeding.  Full strength and range of motion at the PIP and DIP.  No abnormal sensation  Skin:    General: Skin is warm and dry.  Neurological:     Mental Status: He is alert.  Psychiatric:        Behavior: Behavior normal.      ED Treatments / Results  Labs (all labs ordered are listed, but only abnormal results are displayed) Labs Reviewed - No data to display  EKG None  Radiology No results found.  Procedures .Marland KitchenLaceration Repair Date/Time: 12/21/2018 6:38 PM Performed by: Margarita Mail, PA-C Authorized by: Margarita Mail, PA-C   Consent:    Consent obtained:  Verbal   Consent given by:  Patient   Risks discussed:  Infection, pain, need for additional repair and poor cosmetic result   Alternatives discussed:  No treatment Anesthesia (see MAR for exact dosages):    Anesthesia method:  Local infiltration   Local anesthetic:  Lidocaine 1% w/o epi  Laceration details:    Location:  Finger   Finger location:  L index finger   Length (cm):  1.5   Depth (mm):  2 Repair type:    Repair type:  Simple Pre-procedure details:    Preparation:  Patient was prepped and draped in usual sterile fashion Exploration:    Wound exploration: wound explored through full range of motion     Wound extent: vascular damage     Contaminated: no   Treatment:    Area cleansed with:  Saline and Betadine   Amount of cleaning:  Standard  Irrigation solution:  Sterile saline   Irrigation method:  Pressure wash Skin repair:    Repair method:  Sutures   Suture size:  4-0   Suture material:  Prolene   Suture technique:  Simple interrupted   Number of sutures:  8 Approximation:    Approximation:  Close Post-procedure details:    Dressing:  Sterile dressing   Patient tolerance of procedure:  Tolerated well, no immediate complications   (including critical care time)  Medications Ordered in ED Medications  Tdap (BOOSTRIX) injection 0.5 mL (0.5 mLs Intramuscular Given 12/21/18 1803)  lidocaine (PF) (XYLOCAINE) 1 % injection 5 mL (5 mLs Intradermal Given 12/21/18 1803)     Initial Impression / Assessment and Plan / ED Course  I have reviewed the triage vital signs and the nursing notes.  Pertinent labs & imaging results that were available during my care of the patient were reviewed by me and considered in my medical decision making (see chart for details).        .Tdap booster given.Pressure irrigation performed. Laceration occurred < 8 hours prior to repair which was well tolerated. Pt has no co morbidities to effect normal wound healing. Discussed suture home care w pt and answered questions. Pt to f-u for wound check and suture removal in 8-10 days. Pt is hemodynamically stable w no complaints prior to dc.     Final Clinical Impressions(s) / ED Diagnoses   Final diagnoses:  None    ED Discharge Orders    None       Margarita Mail,  PA-C 12/21/18 1845    Maudie Flakes, MD 12/26/18 [phone]

## 2018-12-25 MED ORDER — ICOSAPENT ETHYL 0.5 G PO CAPS: 2.0000 g | ORAL_CAPSULE | Freq: Two times a day (BID) | ORAL | 5 refills | Status: DC

## 2019-01-24 ENCOUNTER — Telehealth (HOSPITAL_COMMUNITY): Payer: Self-pay | Admitting: *Deleted

## 2019-01-24 DIAGNOSIS — H11043 Peripheral pterygium, stationary, bilateral: Secondary | ICD-10-CM | POA: Diagnosis not present

## 2019-01-24 DIAGNOSIS — H52203 Unspecified astigmatism, bilateral: Secondary | ICD-10-CM | POA: Diagnosis not present

## 2019-01-24 DIAGNOSIS — H524 Presbyopia: Secondary | ICD-10-CM | POA: Diagnosis not present

## 2019-01-24 NOTE — Telephone Encounter (Signed)
Left message for patient re: the maintenance Cardiac Rehab exercise classes.  At this point they remain on hold due to the COVID-19 pandemic precautions.

## 2019-04-26 ENCOUNTER — Other Ambulatory Visit: Payer: Self-pay | Admitting: Cardiovascular Disease

## 2019-04-27 ENCOUNTER — Encounter: Payer: Self-pay | Admitting: Cardiovascular Disease

## 2019-04-27 ENCOUNTER — Other Ambulatory Visit: Payer: Self-pay

## 2019-04-27 ENCOUNTER — Ambulatory Visit: Payer: BLUE CROSS/BLUE SHIELD | Admitting: Cardiovascular Disease

## 2019-04-27 VITALS — BP 140/92 | HR 80 | Ht 70.5 in | Wt 183.8 lb

## 2019-04-27 DIAGNOSIS — E785 Hyperlipidemia, unspecified: Secondary | ICD-10-CM

## 2019-04-27 DIAGNOSIS — I2510 Atherosclerotic heart disease of native coronary artery without angina pectoris: Secondary | ICD-10-CM | POA: Diagnosis not present

## 2019-04-27 DIAGNOSIS — E782 Mixed hyperlipidemia: Secondary | ICD-10-CM | POA: Diagnosis not present

## 2019-04-27 DIAGNOSIS — Z789 Other specified health status: Secondary | ICD-10-CM

## 2019-04-27 MED ORDER — ROSUVASTATIN CALCIUM 10 MG PO TABS: 10.0000 mg | ORAL_TABLET | Freq: Every day | ORAL | 3 refills | Status: DC

## 2019-04-27 MED ORDER — TICAGRELOR 60 MG PO TABS: 60.0000 mg | ORAL_TABLET | Freq: Two times a day (BID) | ORAL | 11 refills | Status: DC

## 2019-04-27 NOTE — Patient Instructions (Addendum)
Medication Instructions:  Your physician has recommended you make the following change in your medication:  START Rosuvastatin (Crestor) 10 mg daily  If you need a refill on your cardiac medications before your next appointment, please call your pharmacy.   Lab work: Your physician recommends that you return for lab work in: 3 months on Thursday Jan. 7 You may come in anytime between 7:30 am and 4:45 pm You will need to FAST for this appointment - nothing to eat or drink after midnight the night before except water.    Testing/Procedures: None Ordered    Follow-Up: At [facility], you and your health needs are our priority.  As part of our continuing mission to provide you with exceptional heart care, we have created designated Provider Care Teams.  These Care Teams include your primary Cardiologist (physician) and Advanced Practice Providers (APPs -  Physician Assistants and Nurse Practitioners) who all work together to provide you with the care you need, when you need it. You will need a follow up appointment in:  6 months.  Please call our office 2 months in advance to schedule this appointment.  You may see Mertie Moores, MD or one of the following Advanced Practice Providers on your designated Care Team: Richardson Dopp, PA-C [place], [place] . Daune Perch, NP

## 2019-04-27 NOTE — Progress Notes (Signed)
Cardiology Office Note:    Date:  04/27/2019   ID:  Charles Montoya, DOB April 21, 1971, MRN [id number]  PCP:  Lujean Amel, MD  Cardiologist:  Mertie Moores, MD    Referring MD: Lujean Amel, MD   Problem List 1. CAD - s/p DES to PDA 2. Hyperlipidemia:   Chief Complaint  Patient presents with  . Coronary Artery Disease       Charles Montoya is a 48 y.o. male with a hx of Coronary artery disease. He status post stenting of his posterior descending artery in June, 2018. He has moderate disease and his left anterior descending artery and circumflex proximal artery.  Is exercising 4 times a week. Has stopped smoking  Using nicorette gum .   He was having lots of fatigue - he reduced his atorvastatin to 20 mg QOD.   Fatigue  Is better.     November 08, 2017 Doing well.  Lipid levels look good  Has vague aches and pains.   No angina pain  Exercising regularly  intolerent to statins ,  Is on Repatha Asked questions about insulin resistance  Oct. 2, 2019:  Charles Montoya is seen back for follow up of his CAD.   S/p stenting of his PDA. Was on Repatha - became very fatigued. Stopped the Repatha l several weeks ago.   Is finally feeling better now Tried multiple statins,  All the ones he tried made him ache.  , foggy headed. , fatigued.    April 27, 2019: CV seen today for follow-up of his coronary artery disease.  He status post testing of his right coronary artery. Has a history of hyperlipidemia.  He tried multiple statins all of which make him have muscle aches and feel foggy headed.  He tried Repatha but became very fatigued. Pralulent also caused severe fagigue. VAsepa caused leg weakness.  Is on Brilinta 60 mg bid.  Off ASA   Past Medical History:  Diagnosis Date  . Anxiety   . CAD (coronary artery disease)    a. cath 12/2016--> s/p DES to RPDA, 50% dRCA, 25% pLAD, 40% mLAD, 25% pro to mid CX  . Tobacco abuse     Past Surgical History:  Procedure Laterality Date  .  CARDIAC CATHETERIZATION    . CORONARY STENT INTERVENTION N/A 01/19/2017   Procedure: Coronary Stent Intervention;  Surgeon: Jettie Booze, MD;  Location: [facility];  Service: Cardiovascular;  Laterality: N/A;  . HERNIA REPAIR    . LEFT HEART CATH AND CORONARY ANGIOGRAPHY N/A 01/19/2017   Procedure: Left Heart Cath and Coronary Angiography;  Surgeon: Jettie Booze, MD;  Location: [facility] [facility];  Service: Cardiovascular;  Laterality: N/A;    Current Medications: Current Meds  Medication Sig  . ALPRAZolam (XANAX) 0.5 MG tablet Take 0.5 mg by mouth 3 (three) times daily as needed for anxiety. Anxiety   . clomiPRAMINE (ANAFRANIL) 25 MG capsule Take 25 mg by mouth at bedtime.  . clotrimazole-betamethasone (LOTRISONE) cream Apply 1 application topically daily.  . cyclobenzaprine (FLEXERIL) 10 MG tablet Take 1 tablet (10 mg total) by mouth 2 (two) times daily as needed for muscle spasms.  . nicotine polacrilex (NICORETTE) 4 MG gum Take 4 mg by mouth as needed for smoking cessation.  . nitroGLYCERIN (NITROSTAT) 0.4 MG SL tablet 1 TAB UNDER TONGUE AS NEEDED FOR CHEST PAIN. MAY REPEAT EVERY 5 MIN FOR A TOTAL OF 3 DOSES.  Marland Kitchen ticagrelor (BRILINTA) 60 MG TABS tablet Take 1  tablet (60 mg total) by mouth 2 (two) times daily.  . [DISCONTINUED] ticagrelor (BRILINTA) 60 MG TABS tablet Take 1 tablet (60 mg total) by mouth 2 (two) times daily.     Allergies:   Codeine, Praluent [alirocumab], Repatha [evolocumab], and Statins   Social History   Socioeconomic History  . Marital status: Single    Spouse name: Not on file  . Number of children: Not on file  . Years of education: Not on file  . Highest education level: Not on file  Occupational History  . Not on file  Social Needs  . Financial resource strain: Not on file  . Food insecurity    Worry: Not on file    Inability: Not on file  . Transportation needs    Medical: Not on file    Non-medical: Not on file  Tobacco Use   . Smoking status: Former Smoker    Packs/day: 1.50    Types: Cigarettes    Quit date: 01/19/2017    Years since quitting: 2.2  . Smokeless tobacco: Never Used  . Tobacco comment: Using nicoderm gum  Substance and Sexual Activity  . Alcohol use: No  . Drug use: No  . Sexual activity: Not on file  Lifestyle  . Physical activity    Days per week: Not on file    Minutes per session: Not on file  . Stress: Not on file  Relationships  . Social Herbalist on phone: Not on file    Gets together: Not on file    Attends religious service: Not on file    Active member of club or organization: Not on file    Attends meetings of clubs or organizations: Not on file    Relationship status: Not on file  Other Topics Concern  . Not on file  Social History Narrative  . Not on file     Family History: The patient's family history includes CAD in his father; Hypertension in his mother. ROS:   Please see the history of present illness.     All other systems reviewed and are negative.  EKGs/Labs/Other Studies Reviewed:    The following studies were reviewed today:  EKG:    April 27, 2019: Normal sinus rhythm at 80 beats minute.  Previous inferior wall myocardial infarction.  Recent Labs: 05/12/2018: ALT 17; BUN 16; Creatinine, Ser 1.34; Potassium 3.9; Sodium 141  Recent Lipid Panel    Component Value Date/Time   CHOL 240 (H) 05/12/2018 1118   TRIG 141 05/12/2018 1118   TRIG 163 (H) 06/07/2017 0918   HDL 38 (L) 05/12/2018 1118   HDL 34 (L) 06/07/2017 0918   CHOLHDL 6.3 (H) 05/12/2018 1118   CHOLHDL 6.6 01/19/2017 0845   VLDL 48 (H) 01/19/2017 0845   LDLCALC 174 (H) 05/12/2018 1118      ASSESSMENT:    1. Hyperlipidemia LDL goal <70   2. Coronary artery disease involving native coronary artery of native heart without angina pectoris   3. Statin intolerance    PLAN:    In order of problems listed above:  CAD -    status post stenting of his posterior descending  artery using a 2.5 x 16 mm Promus drug-eluting stent. Is not having any angina . Discussed stopping brilinta.   He wants to continue brilinta .  No angina .   2. Hyperlipidema:   He is tried numerous statins.  He is also tried Manufacturing engineer.  He could  not tolerate any of these.  We will try him on low-dose Crestor.  We will start with rosuvastatin 10 mg twice a week.  He will experiment to see whether he can tolerate it twice a week or perhaps even once a week.  When I see him again we will consider adding Zetia if he has tolerated the Crestor.  Will check lipids in 3 months.  I will see him back in the office in 6 months.    Medication Adjustments/Labs and Tests Ordered: Current medicines are reviewed at length with the patient today.  Concerns regarding medicines are outlined above.  Orders Placed This Encounter  Procedures  . Lipid Profile  . Basic Metabolic Panel (BMET)  . Hepatic function panel  . EKG 12-Lead   Meds ordered this encounter  Medications  . rosuvastatin (CRESTOR) 10 MG tablet    Sig: Take 1 tablet (10 mg total) by mouth daily.    Dispense:  90 tablet    Refill:  3  . ticagrelor (BRILINTA) 60 MG TABS tablet    Sig: Take 1 tablet (60 mg total) by mouth 2 (two) times daily.    Dispense:  60 tablet    Refill:  11     Signed, Mertie Moores, MD  04/27/2019 5:37 PM    [place]

## 2019-05-07 ENCOUNTER — Telehealth: Payer: Self-pay | Admitting: Cardiovascular Disease

## 2019-05-07 NOTE — Telephone Encounter (Signed)
Dr. Acie Fredrickson has responded to patient through [facility]

## 2019-05-07 NOTE — Telephone Encounter (Signed)
New message   Patient has questions about ekg results on my chart. Please call.

## 2019-06-12 DIAGNOSIS — F410 Panic disorder [episodic paroxysmal anxiety] without agoraphobia: Secondary | ICD-10-CM | POA: Diagnosis not present

## 2019-07-04 ENCOUNTER — Telehealth: Payer: Self-pay | Admitting: Cardiovascular Disease

## 2019-07-04 NOTE — Telephone Encounter (Signed)
Patient called stating he had a tooth pulled and wants to know if he could take ibuprofen instead of tylenol for the pain.

## 2019-07-04 NOTE — Telephone Encounter (Signed)
Spoke with patient and advised that he may take ibuprofen or naproxen over the next few days for his tooth pain. I advised that it is safe to take short term but he should not take it on a daily or regular basis. He verbalized understanding and thanked me for the call.

## 2019-07-23 IMAGING — CR DG CHEST 2V
2 series · 2 of 2 positions shown · non-contrast
Comparison: Chest x-ray dated January 18, 2017.

CLINICAL DATA: Left arm and left-sided back pain for the past day.

EXAM:
CHEST  2 VIEW

[chest pa]
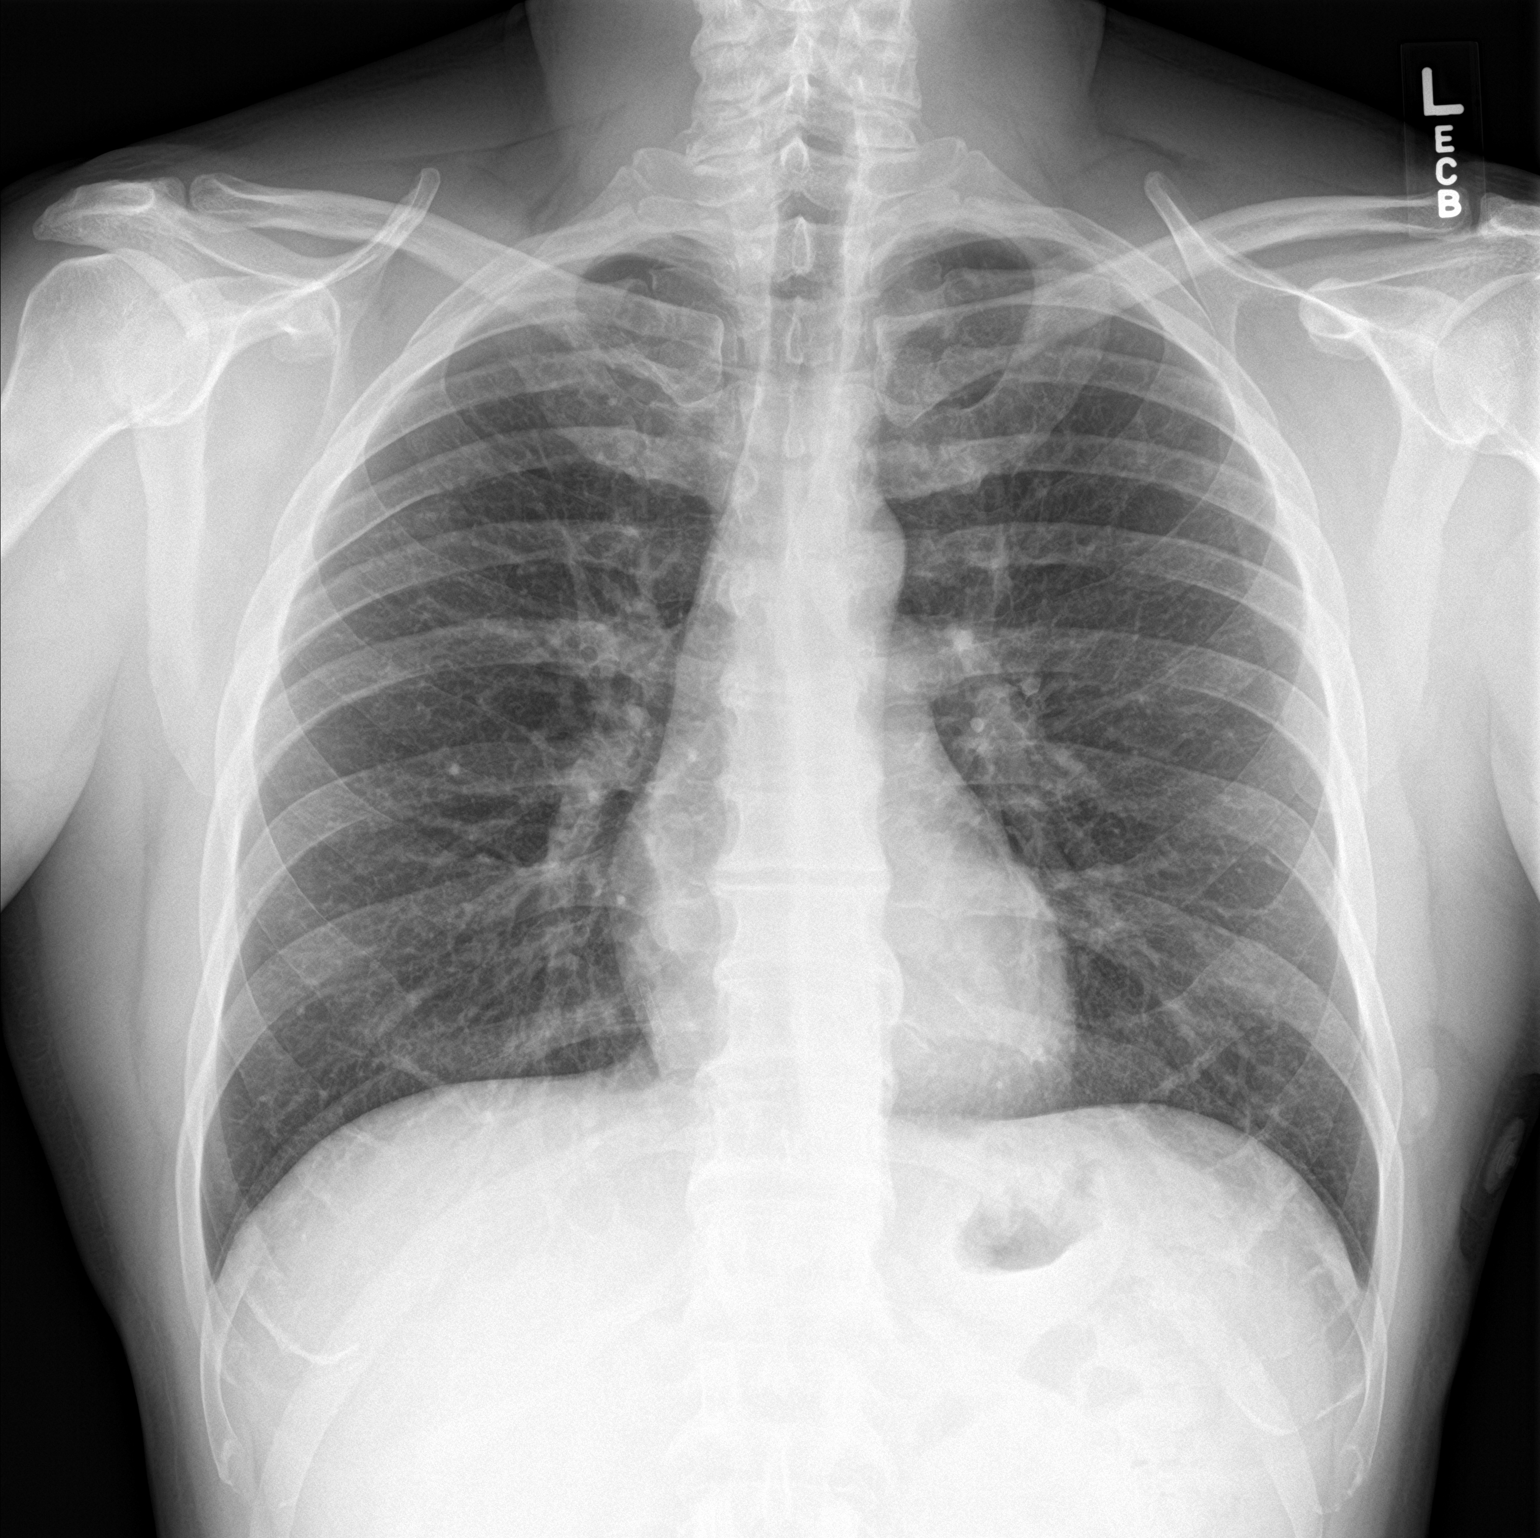

[chest lat]
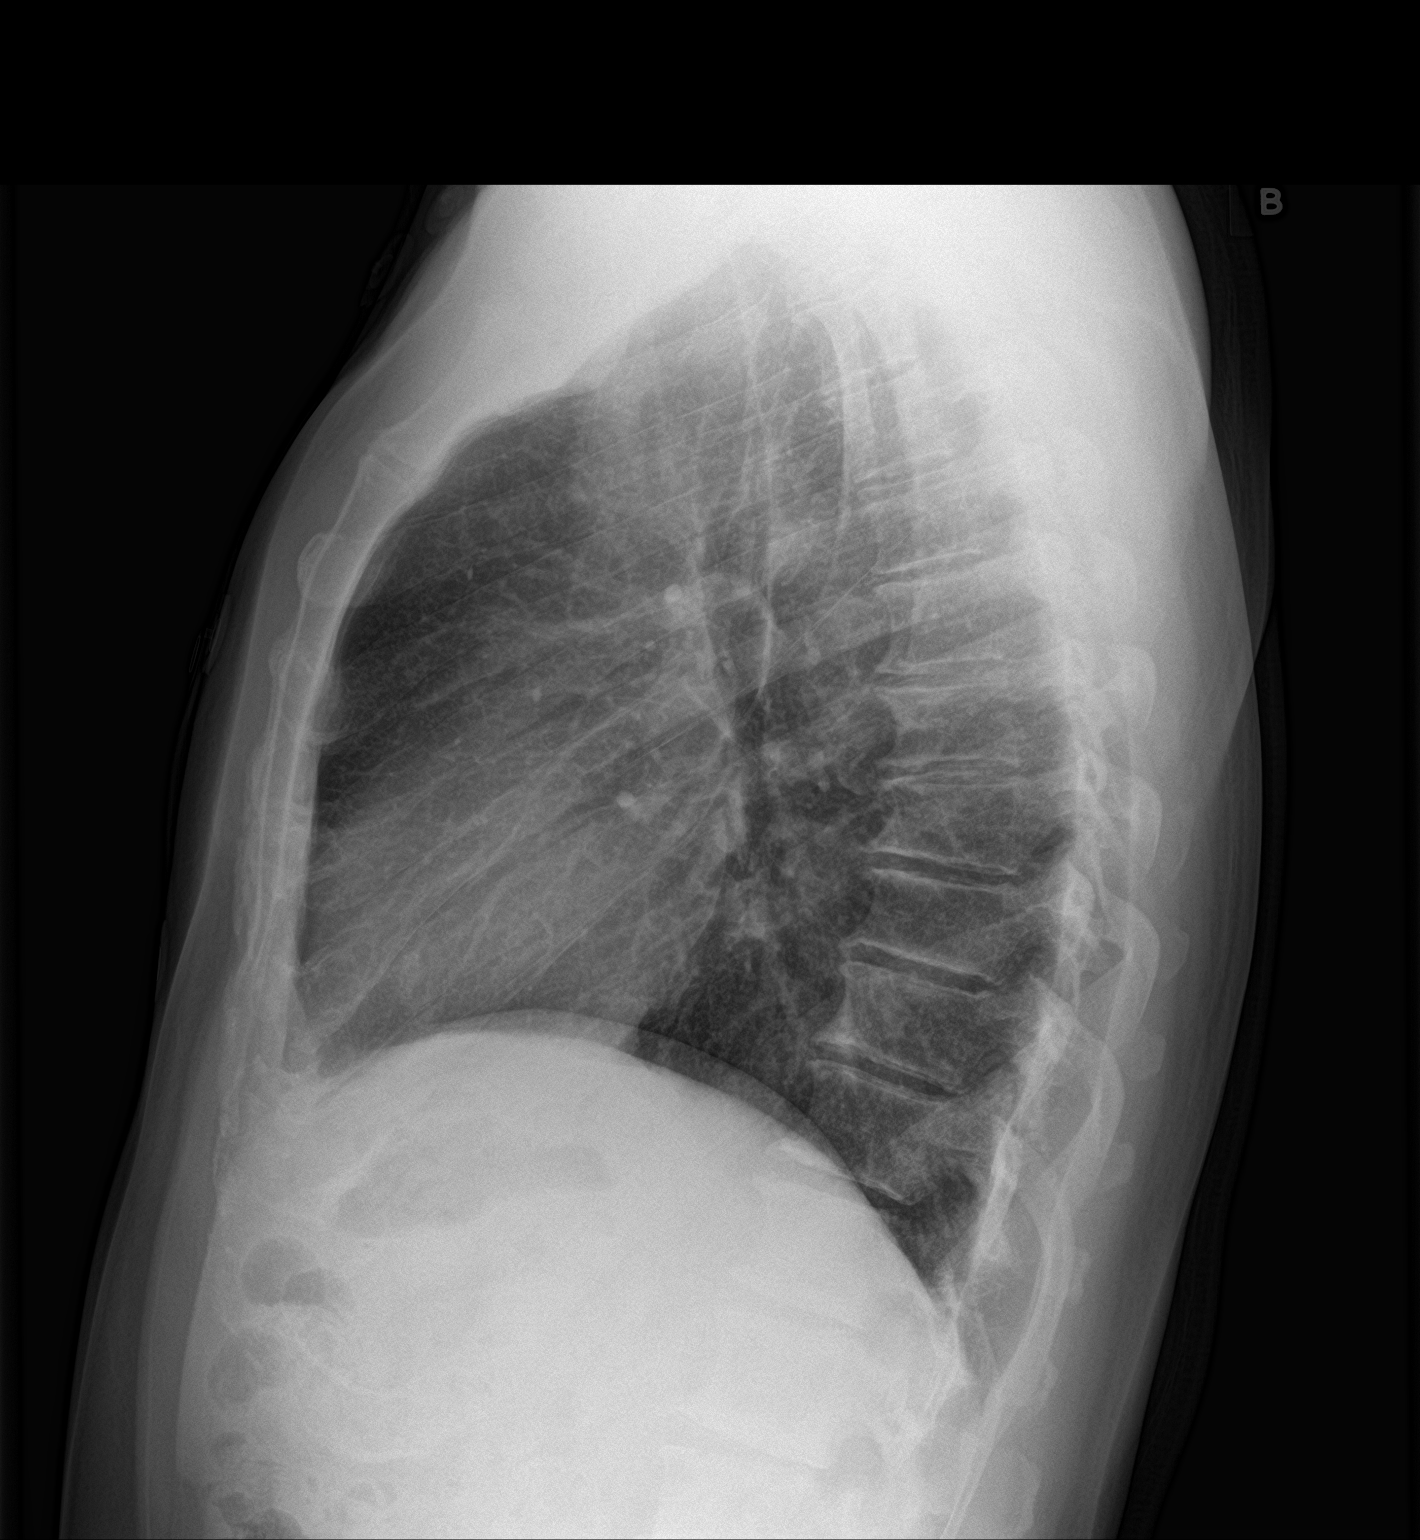

[2 of 2 positions shown; findings below may reference images not displayed]

FINDINGS: The cardiomediastinal silhouette is normal in size. Normal pulmonary
vascularity. No focal consolidation, pleural effusion, or
pneumothorax. No acute osseous abnormality.
IMPRESSION: No active cardiopulmonary disease.

## 2019-08-02 ENCOUNTER — Other Ambulatory Visit: Payer: BC Managed Care – PPO | Admitting: *Deleted

## 2019-08-02 ENCOUNTER — Other Ambulatory Visit: Payer: Self-pay

## 2019-08-02 DIAGNOSIS — I2510 Atherosclerotic heart disease of native coronary artery without angina pectoris: Secondary | ICD-10-CM | POA: Diagnosis not present

## 2019-08-02 DIAGNOSIS — Z789 Other specified health status: Secondary | ICD-10-CM | POA: Diagnosis not present

## 2019-08-02 DIAGNOSIS — E785 Hyperlipidemia, unspecified: Secondary | ICD-10-CM | POA: Diagnosis not present

## 2019-08-02 LAB — HEPATIC FUNCTION PANEL
ALT: 16 IU/L (ref 0–44)
AST: 18 IU/L (ref 0–40)
Albumin: 4.8 g/dL (ref 4.0–5.0)
Alkaline Phosphatase: 78 IU/L (ref 39–117)
Bilirubin Total: 0.5 mg/dL (ref 0.0–1.2)
Bilirubin, Direct: 0.1 mg/dL (ref 0.00–0.40)
Total Protein: 6.9 g/dL (ref 6.0–8.5)

## 2019-08-02 LAB — LIPID PANEL
Chol/HDL Ratio: 6.7 ratio — ABNORMAL HIGH (ref 0.0–5.0)
Cholesterol, Total: 263 mg/dL — ABNORMAL HIGH (ref 100–199)
HDL: 39 mg/dL — ABNORMAL LOW (ref >39)
LDL Chol Calc (NIH): 185 mg/dL — ABNORMAL HIGH (ref 0–99)
Triglycerides: 203 mg/dL — ABNORMAL HIGH (ref 0–149)
VLDL Cholesterol Cal: 39 mg/dL (ref 5–40)

## 2019-08-02 LAB — BASIC METABOLIC PANEL
BUN/Creatinine Ratio: 12 (ref 9–20)
BUN: 15 mg/dL (ref 6–24)
CO2: 23 mmol/L (ref 20–29)
Calcium: 9.7 mg/dL (ref 8.7–10.2)
Chloride: 97 mmol/L (ref 96–106)
Creatinine, Ser: 1.27 mg/dL (ref 0.76–1.27)
GFR calc Af Amer: 77 mL/min/{1.73_m2} (ref >59)
GFR calc non Af Amer: 67 mL/min/{1.73_m2} (ref >59)
Glucose: 113 mg/dL — ABNORMAL HIGH (ref 65–99)
Potassium: 4 mmol/L (ref 3.5–5.2)
Sodium: 135 mmol/L (ref 134–144)

## 2019-08-16 DIAGNOSIS — Z0000 Encounter for general adult medical examination without abnormal findings: Secondary | ICD-10-CM | POA: Diagnosis not present

## 2019-08-30 DIAGNOSIS — E7800 Pure hypercholesterolemia, unspecified: Secondary | ICD-10-CM | POA: Diagnosis not present

## 2019-08-30 DIAGNOSIS — I2510 Atherosclerotic heart disease of native coronary artery without angina pectoris: Secondary | ICD-10-CM | POA: Diagnosis not present

## 2019-08-30 DIAGNOSIS — Z125 Encounter for screening for malignant neoplasm of prostate: Secondary | ICD-10-CM | POA: Diagnosis not present

## 2019-08-30 DIAGNOSIS — Z0000 Encounter for general adult medical examination without abnormal findings: Secondary | ICD-10-CM | POA: Diagnosis not present

## 2019-08-30 DIAGNOSIS — Z131 Encounter for screening for diabetes mellitus: Secondary | ICD-10-CM | POA: Diagnosis not present

## 2019-08-31 ENCOUNTER — Telehealth: Payer: Self-pay | Admitting: Internal Medicine

## 2019-08-31 NOTE — Telephone Encounter (Signed)
He definitely needs to see [place], but we don't have availability sooner.  Dr Lemmie Evens

## 2019-08-31 NOTE — Telephone Encounter (Signed)
Pt updated and verbalized understanding. Pt will keep scheduled appointment on 3/26.

## 2019-08-31 NOTE — Telephone Encounter (Signed)
Patient wanted to know if his appt to see Dr. Debara Pickett on 03-26 needed to be moved up based on the lab results he received. Please let the patient know if that appointment is OK or if he needs to reschedule for a sooner date

## 2019-08-31 NOTE — Telephone Encounter (Signed)
Pt has a scheduled appointment on 3/26. He is questioning if MD recommends a sooner appointment based of recent cholesterol numbers.  Will route to Dr. Debara Pickett and nurse.

## 2019-10-01 ENCOUNTER — Other Ambulatory Visit: Payer: Self-pay | Admitting: Interventional Cardiology

## 2019-10-19 ENCOUNTER — Encounter: Payer: Self-pay | Admitting: Internal Medicine

## 2019-10-19 ENCOUNTER — Other Ambulatory Visit: Payer: Self-pay

## 2019-10-19 ENCOUNTER — Ambulatory Visit: Payer: BC Managed Care – PPO | Admitting: Internal Medicine

## 2019-10-19 VITALS — BP 122/90 | HR 78 | Temp 96.6°F | Ht 71.0 in | Wt 184.2 lb

## 2019-10-19 DIAGNOSIS — E785 Hyperlipidemia, unspecified: Secondary | ICD-10-CM | POA: Diagnosis not present

## 2019-10-19 DIAGNOSIS — Z789 Other specified health status: Secondary | ICD-10-CM | POA: Diagnosis not present

## 2019-10-19 DIAGNOSIS — I2510 Atherosclerotic heart disease of native coronary artery without angina pectoris: Secondary | ICD-10-CM | POA: Diagnosis not present

## 2019-10-19 MED ORDER — EZETIMIBE 10 MG PO TABS: 10.0000 mg | ORAL_TABLET | Freq: Every day | ORAL | 3 refills | Status: DC

## 2019-10-19 NOTE — Patient Instructions (Addendum)
Medication Instructions:  START zetia 10mg  daily   Orion-4 trial   Inclisarin - once every 6 month injection  *If you need a refill on your cardiac medications before your next appointment, please call your pharmacy*   Lab Work: FASTING lab work in 3-4 months to check cholesterol  If you have labs (blood work) drawn today and your tests are completely normal, you will receive your results only by: Marland Kitchen MyChart Message (if you have MyChart) OR . A paper copy in the mail If you have any lab test that is abnormal or we need to change your treatment, we will call you to review the results.   Testing/Procedures: NONE   Follow-Up: At [facility], you and your health needs are our priority.  As part of our continuing mission to provide you with exceptional heart care, we have created designated Provider Care Teams.  These Care Teams include your primary Cardiologist (physician) and Advanced Practice Providers (APPs -  Physician Assistants and Nurse Practitioners) who all work together to provide you with the care you need, when you need it.  We recommend signing up for the patient portal called "MyChart".  Sign up information is provided on this After Visit Summary.  MyChart is used to connect with patients for Virtual Visits (Telemedicine).  Patients are able to view lab/test results, encounter notes, upcoming appointments, etc.  Non-urgent messages can be sent to your provider as well.   To learn more about what you can do with MyChart, go to [email or web address].    Your next appointment:   3-4 month(s) - lipid clinic  The format for your next appointment:   Either In Person or Virtual  Provider:   K. Mali Hilty, MD   Other Instructions

## 2019-10-19 NOTE — Progress Notes (Signed)
OFFICE CONSULT NOTE  Chief Complaint:  Follow-up dyslipidemia  Primary Care Physician: Lujean Amel, MD  HPI:  Charles Montoya is a 49 y.o. male who is being seen today for the evaluation of elevated cholesterol at the request of Liam Rogers, MD. This is a pleasant 49 year old male who unfortunately had early onset heart disease.  In June 2018 he underwent cardiac catheterization and required a stent to the PDA but was also noted to have mild to moderate multivessel disease.  He has a long-standing smoking history but quit smoking at this event.  He also has significant anxiety history.  Family story significant for his father who had heart disease and underwent heart surgery in his 13s.  He is grandfather on his father's side also had coronary disease.  He does not have any children.  Lipid profile in 2018 showed total cholesterol of 253.  April be 155, LDL particle #2334, with predominantly small LDL and an increased small LDL particle number of 1311.  Triglycerides 186.  LDL-C of 180.  He has subsequently been placed on Lipitor 40 mg without significant improvement in his numbers and that dose was then increased up to 80 mg.  He had already started to experience some side effects from atorvastatin however on the 80 mg dose had significant fatigue and achiness to the point where he had trouble getting out of bed.  He was taken off that and his symptoms resolved.  Subsequently he was tried on rosuvastatin with similar side effects again improved after discontinuing the medication.  Finally he was on simvastatin.  Again he had recurrent myalgias.  He was then subsequently started appropriately on Repatha which he took for several months.  His last lipid profile in April 2019 on medication showed marked improvement in his LDL particle numbers down from 2294-898.  LDL-C dropped from 166-71.  This represented an excellent response to therapy, however after a couple of months on injections he again started  to feel significant fatigue, lethargy and soreness with symptoms that were much worse than with the statins.  He was then referred to the lipid clinic for additional treatment options.  08/17/2018  Mr. Coll returns today for follow-up of dyslipidemia.  He had repeat lipid testing 3 days ago which showed no significant improvement in his lipid profile.  Total cholesterol was 233, HDL 36, LDL 142 and triglycerides 275.  On fortunately as he was intolerant to Repatha, he was switched to Praluent.  He did have one home injection of Praluent and felt significant fatigue and lassisitude.  He said he fell into a deep depression and was fairly lethargic.  He attributed this to the medication and did not continue it.  At this point he is reluctant to take either PCSK9 inhibitor or statin medications.  LDL remains above goal and triglycerides are high as well given his history of coronary disease and PCI.  10/19/2019  Mr. Sippel returns today for follow-up.  Unfortunately he was not able to tolerate Praluent or Repatha due to significant side effects.  He is also not tolerated statins.  We discussed the possibility of using ezetimibe or bempedoic acid, neither which have been previously trialed.  We also discussed the medication inclisiran which is likely to be FDA approved this year.  We are also currently enrolling in a clinical trial (Orion-4) which is setting this out to 5 years versus placebo.  This is a clinical outcomes trial.  PMHx:  Past Medical History:  Diagnosis Date  .  Anxiety   . CAD (coronary artery disease)    a. cath 12/2016--> s/p DES to RPDA, 50% dRCA, 25% pLAD, 40% mLAD, 25% pro to mid CX  . Tobacco abuse     Past Surgical History:  Procedure Laterality Date  . CARDIAC CATHETERIZATION    . CORONARY STENT INTERVENTION N/A 01/19/2017   Procedure: Coronary Stent Intervention;  Surgeon: Jettie Booze, MD;  Location: [place] [facility];  Service: Cardiovascular;  Laterality: N/A;  .  HERNIA REPAIR    . LEFT HEART CATH AND CORONARY ANGIOGRAPHY N/A 01/19/2017   Procedure: Left Heart Cath and Coronary Angiography;  Surgeon: Jettie Booze, MD;  Location: [facility];  Service: Cardiovascular;  Laterality: N/A;    FAMHx:  Family History  Problem Relation Age of Onset  . CAD Father   . Hypertension Mother     SOCHx:   reports that he quit smoking about 2 years ago. His smoking use included cigarettes. He smoked 1.50 packs per day. He has never used smokeless tobacco. He reports that he does not drink alcohol or use drugs.  ALLERGIES:  Allergies  Allergen Reactions  . Codeine Itching  . Praluent [Alirocumab]     Fatigue   . Repatha [Evolocumab]     Cause pain  . Statins Other (See Comments)    Atorvastatin, Rosuvastatin, Simvastatin - generalized fatigue, weakness, pain    ROS: Pertinent items noted in HPI and remainder of comprehensive ROS otherwise negative.  HOME MEDS: Current Outpatient Medications on File Prior to Visit  Medication Sig Dispense Refill  . ALPRAZolam (XANAX) 0.5 MG tablet Take 0.5 mg by mouth 3 (three) times daily as needed for anxiety. Anxiety     . clomiPRAMINE (ANAFRANIL) 25 MG capsule Take 25 mg by mouth at bedtime.    . clotrimazole-betamethasone (LOTRISONE) cream Apply 1 application topically daily.  0  . cyclobenzaprine (FLEXERIL) 10 MG tablet Take 1 tablet (10 mg total) by mouth 2 (two) times daily as needed for muscle spasms. 20 tablet 0  . nicotine polacrilex (NICORETTE) 4 MG gum Take 4 mg by mouth as needed for smoking cessation.    . nitroGLYCERIN (NITROSTAT) 0.4 MG SL tablet 1 TAB UNDER TONGUE AS NEEDED FOR CHEST PAIN. MAY REPEAT EVERY 5 MIN FOR A TOTAL OF 3 DOSES. 25 tablet 3  . ticagrelor (BRILINTA) 60 MG TABS tablet Take 1 tablet (60 mg total) by mouth 2 (two) times daily. 60 tablet 11   No current facility-administered medications on file prior to visit.    LABS/IMAGING: No results found for this or any  previous visit (from the past 48 hour(s)). No results found.  LIPID PANEL:    Component Value Date/Time   CHOL 263 (H) 08/02/2019 1112   TRIG 203 (H) 08/02/2019 1112   TRIG 163 (H) 06/07/2017 0918   HDL 39 (L) 08/02/2019 1112   HDL 34 (L) 06/07/2017 0918   CHOLHDL 6.7 (H) 08/02/2019 1112   CHOLHDL 6.6 01/19/2017 0845   VLDL 48 (H) 01/19/2017 0845   LDLCALC 185 (H) 08/02/2019 1112    WEIGHTS: Wt Readings from Last 3 Encounters:  10/19/19 184 lb 3.2 oz (83.6 kg)  04/27/19 183 lb 12.8 oz (83.4 kg)  08/17/18 185 lb (83.9 kg)    VITALS: BP 122/90   Pulse 78   Temp (!) 96.6 F (35.9 C)   Ht 5\' 11"  (1.803 m)   Wt 184 lb 3.2 oz (83.6 kg)   SpO2 98%  BMI 25.69 kg/m   EXAM: Deferred  EKG: Deferred  ASSESSMENT: 1. CAD s/p PCI to the rPDA (2018) 2. 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 Statin intolerance 4. Intolerant to PCKS9i  PLAN: 1.   Mr. Rosser has had significant intolerances to statins, PCSK9 inhibitors and other therapies.  He has persistently elevated cholesterol with a history of coronary disease and prior PCI in 2018.  He is at high risk for recurrent events.  We discussed alternatives and at this point he is willing to try ezetimibe.  If this is well-tolerated we could layer on bempedoic acid (Nexletol) and then consider the combination Nexlizet.  Another option would be referral for the Orion-4 trial or simply to wait for approval of inclisiran which is expected to be sometime this year.  Follow-up with me in 3-6 months.  Pixie Casino, MD, [facility], [place] Director of the Advanced Lipid Disorders &  Cardiovascular Risk Reduction Clinic  Diplomate of the American Board of Clinical Lipidology  Attending Cardiologist  Direct Dial: [phone]  Fax: [phone]  Website:  [email or web address] 10/19/2019, 11:40 AM

## 2019-10-25 DIAGNOSIS — E785 Hyperlipidemia, unspecified: Secondary | ICD-10-CM

## 2019-10-29 DIAGNOSIS — E785 Hyperlipidemia, unspecified: Secondary | ICD-10-CM | POA: Diagnosis not present

## 2019-10-30 LAB — T4, FREE: Free T4: 1.2 ng/dL (ref 0.82–1.77)

## 2019-10-30 LAB — TSH: TSH: 2.03 u[IU]/mL (ref 0.450–4.500)

## 2019-12-04 DIAGNOSIS — F4010 Social phobia, unspecified: Secondary | ICD-10-CM | POA: Diagnosis not present

## 2019-12-04 DIAGNOSIS — G4709 Other insomnia: Secondary | ICD-10-CM | POA: Diagnosis not present

## 2019-12-17 DIAGNOSIS — M9902 Segmental and somatic dysfunction of thoracic region: Secondary | ICD-10-CM | POA: Diagnosis not present

## 2019-12-17 DIAGNOSIS — M9901 Segmental and somatic dysfunction of cervical region: Secondary | ICD-10-CM | POA: Diagnosis not present

## 2019-12-17 DIAGNOSIS — M546 Pain in thoracic spine: Secondary | ICD-10-CM | POA: Diagnosis not present

## 2019-12-17 DIAGNOSIS — M542 Cervicalgia: Secondary | ICD-10-CM | POA: Diagnosis not present

## 2019-12-18 ENCOUNTER — Other Ambulatory Visit: Payer: Self-pay

## 2019-12-18 ENCOUNTER — Encounter: Payer: Self-pay | Admitting: Cardiovascular Disease

## 2019-12-18 ENCOUNTER — Ambulatory Visit: Payer: BC Managed Care – PPO | Admitting: Cardiovascular Disease

## 2019-12-18 VITALS — BP 130/90 | HR 76 | Ht 70.0 in | Wt 183.0 lb

## 2019-12-18 DIAGNOSIS — Z789 Other specified health status: Secondary | ICD-10-CM

## 2019-12-18 DIAGNOSIS — I2510 Atherosclerotic heart disease of native coronary artery without angina pectoris: Secondary | ICD-10-CM | POA: Diagnosis not present

## 2019-12-18 DIAGNOSIS — E785 Hyperlipidemia, unspecified: Secondary | ICD-10-CM

## 2019-12-18 DIAGNOSIS — E782 Mixed hyperlipidemia: Secondary | ICD-10-CM

## 2019-12-18 MED ORDER — ATORVASTATIN CALCIUM 20 MG PO TABS: 20.0000 mg | ORAL_TABLET | ORAL | 3 refills | Status: DC

## 2019-12-18 NOTE — Patient Instructions (Addendum)
Medication Instructions:  Your physician has recommended you make the following change in your medication:  START Atorvastatin 20 mg every other day - call us to let us know if you are unable to tolerate  *If you need a refill on your cardiac medications before your next appointment, please call your pharmacy*   Lab Work: Your physician recommends that you return for lab work in: 3 months on Wed. August 25 - you may come anytime after 7:30 am You will need to FAST for this appointment - nothing to eat or drink after midnight the night before except water.   If you have labs (blood work) drawn today and your tests are completely normal, you will receive your results only by: Marland Kitchen MyChart Message (if you have MyChart) OR . A paper copy in the mail If you have any lab test that is abnormal or we need to change your treatment, we will call you to review the results.   Testing/Procedures: None Ordered   Follow-Up: At [facility], you and your health needs are our priority.  As part of our continuing mission to provide you with exceptional heart care, we have created designated Provider Care Teams.  These Care Teams include your primary Cardiologist (physician) and Advanced Practice Providers (APPs -  Physician Assistants and Nurse Practitioners) who all work together to provide you with the care you need, when you need it.    Your next appointment:   6 month(s)  The format for your next appointment:   In Person  Provider:   You may see Mertie Moores, MD or one of the following Advanced Practice Providers on your designated Care Team:    Richardson Dopp, PA-C  [place], [place]

## 2019-12-18 NOTE — Progress Notes (Signed)
Cardiology Office Note:    Date:  12/18/2019   ID:  Charles Montoya, DOB 04-08-1971, MRN [id number]  PCP:  Lujean Amel, MD  Cardiologist:  Mertie Moores, MD    Referring MD: Lujean Amel, MD   Problem List 1. CAD - s/p DES to PDA 2. Hyperlipidemia:   Chief Complaint  Patient presents with  . Coronary Artery Disease  . Hyperlipidemia       Charles Montoya is a 49 y.o. male with a hx of Coronary artery disease. He status post stenting of his posterior descending artery in June, 2018. He has moderate disease and his left anterior descending artery and circumflex proximal artery.  Is exercising 4 times a week. Has stopped smoking  Using nicorette gum .   He was having lots of fatigue - he reduced his atorvastatin to 20 mg QOD.   Fatigue  Is better.     November 08, 2017 Doing well.  Lipid levels look good  Has vague aches and pains.   No angina pain  Exercising regularly  intolerent to statins ,  Is on Repatha Asked questions about insulin resistance  Oct. 2, 2019:  Charles Montoya is seen back for follow up of his CAD.   S/p stenting of his PDA. Was on Repatha - became very fatigued. Stopped the Repatha l several weeks ago.   Is finally feeling better now Tried multiple statins,  All the ones he tried made him ache.  , foggy headed. , fatigued.    April 27, 2019: CV seen today for follow-up of his coronary artery disease.  He status post testing of his right coronary artery. Has a history of hyperlipidemia.  He tried multiple statins all of which make him have muscle aches and feel foggy headed.  He tried Repatha but became very fatigued. Pralulent also caused severe fagigue. VAsepa caused leg weakness.  Is on Brilinta 60 mg bid.  Off ASA  Dec 18, 2019:  Charles Montoya is seen back for his CAD, HLD  Intolerant to multiple statins, repatha and pralulent BP is typically well controlled.  Has some anxiety issues on occasion Is not exercising as much.   Wants to get back into cardiac  rehab. Has tried Zetia but developed severe aches and fatigue ( "cant get out of bed")  We discussed the book  Charles Montoya - "Prevent and Reverse Heart Disease"  We had a long discussion about Nexletol. We discussed QOD dosing of Atorvastatin   He wants to try atorvastatin 20 QOD , Quinol.     Past Medical History:  Diagnosis Date  . Anxiety   . CAD (coronary artery disease)    a. cath 12/2016--> s/p DES to RPDA, 50% dRCA, 25% pLAD, 40% mLAD, 25% pro to mid CX  . Tobacco abuse     Past Surgical History:  Procedure Laterality Date  . CARDIAC CATHETERIZATION    . CORONARY STENT INTERVENTION N/A 01/19/2017   Procedure: Coronary Stent Intervention;  Surgeon: Jettie Booze, MD;  Location: [facility];  Service: Cardiovascular;  Laterality: N/A;  . HERNIA REPAIR    . LEFT HEART CATH AND CORONARY ANGIOGRAPHY N/A 01/19/2017   Procedure: Left Heart Cath and Coronary Angiography;  Surgeon: Jettie Booze, MD;  Location: [facility];  Service: Cardiovascular;  Laterality: N/A;    Current Medications: Current Meds  Medication Sig  . ALPRAZolam (XANAX) 0.5 MG tablet Take 0.5 mg by mouth 3 (three) times daily as needed for  anxiety. Anxiety   . clomiPRAMINE (ANAFRANIL) 25 MG capsule Take 25 mg by mouth at bedtime.  . clotrimazole-betamethasone (LOTRISONE) cream Apply 1 application topically daily.  . cyclobenzaprine (FLEXERIL) 10 MG tablet Take 1 tablet (10 mg total) by mouth 2 (two) times daily as needed for muscle spasms.  Marland Kitchen ezetimibe (ZETIA) 10 MG tablet Take 1 tablet (10 mg total) by mouth daily.  . nicotine polacrilex (NICORETTE) 4 MG gum Take 4 mg by mouth as needed for smoking cessation.  . nitroGLYCERIN (NITROSTAT) 0.4 MG SL tablet 1 TAB UNDER TONGUE AS NEEDED FOR CHEST PAIN. MAY REPEAT EVERY 5 MIN FOR A TOTAL OF 3 DOSES.  Marland Kitchen ticagrelor (BRILINTA) 60 MG TABS tablet Take 1 tablet (60 mg total) by mouth 2 (two) times daily.  . traZODone (DESYREL) 50 MG  tablet Take 1 tablet by mouth as needed.     Allergies:   Codeine, Praluent [alirocumab], Repatha [evolocumab], and Statins   Social History   Socioeconomic History  . Marital status: Single    Spouse name: Not on file  . Number of children: Not on file  . Years of education: Not on file  . Highest education level: Not on file  Occupational History  . Not on file  Tobacco Use  . Smoking status: Former Smoker    Packs/day: 1.50    Types: Cigarettes    Quit date: 01/19/2017    Years since quitting: 2.9  . Smokeless tobacco: Never Used  . Tobacco comment: Using nicoderm gum  Substance and Sexual Activity  . Alcohol use: No  . Drug use: No  . Sexual activity: Not on file  Other Topics Concern  . Not on file  Social History Narrative  . Not on file   Social Determinants of Health   Financial Resource Strain:   . Difficulty of Paying Living Expenses:   Food Insecurity:   . Worried About Charity fundraiser in the Last Year:   . Arboriculturist in the Last Year:   Transportation Needs:   . Film/video editor (Medical):   Marland Kitchen Lack of Transportation (Non-Medical):   Physical Activity:   . Days of Exercise per Week:   . Minutes of Exercise per Session:   Stress:   . Feeling of Stress :   Social Connections:   . Frequency of Communication with Friends and Family:   . Frequency of Social Gatherings with Friends and Family:   . Attends Religious Services:   . Active Member of Clubs or Organizations:   . Attends Archivist Meetings:   Marland Kitchen Marital Status:      Family History: The patient's family history includes CAD in his father; Hypertension in his mother. ROS:   Please see the history of present illness.     All other systems reviewed and are negative.  EKGs/Labs/Other Studies Reviewed:    Physical Exam: Blood pressure 130/90, pulse 76, height 5\' 10"  (1.778 m), weight 183 lb (83 kg), SpO2 98 %.  GEN:  Well nourished, well developed in no acute  distress HEENT: Normal NECK: No JVD; No carotid bruits LYMPHATICS: No lymphadenopathy CARDIAC: RRR , no murmurs, rubs, gallops RESPIRATORY:  Clear to auscultation without rales, wheezing or rhonchi  ABDOMEN: Soft, non-tender, non-distended MUSCULOSKELETAL:  No edema; No deformity  SKIN: Warm and dry NEUROLOGIC:  Alert and oriented x 3  EKG:      Recent Labs: 08/02/2019: ALT 16; BUN 15; Creatinine, Ser 1.27; Potassium 4.0; Sodium 135  10/29/2019: TSH 2.030  Recent Lipid Panel    Component Value Date/Time   CHOL 263 (H) 08/02/2019 1112   TRIG 203 (H) 08/02/2019 1112   TRIG 163 (H) 06/07/2017 0918   HDL 39 (L) 08/02/2019 1112   HDL 34 (L) 06/07/2017 0918   CHOLHDL 6.7 (H) 08/02/2019 1112   CHOLHDL 6.6 01/19/2017 0845   VLDL 48 (H) 01/19/2017 0845   LDLCALC 185 (H) 08/02/2019 1112      ASSESSMENT:    1. Hyperlipidemia LDL goal <70   2. Coronary artery disease involving native coronary artery of native heart without angina pectoris   3. Statin intolerance   4. Mixed hyperlipidemia    PLAN:    In order of problems listed above:  CAD -    status post stenting of his posterior descending artery using a 2.5 x 16 mm Promus drug-eluting stent.   2. Hyperlipidema:   Intol to many meds.   We discussed at great length very strict diet as suggested by Dr. Andres Labrum. .  We discussed Nexletol.  He wants to try atorvastatin with Quinol  to see if he is able to tolerate it. Check lipids, liver enzymes, basic metabolic profile in 3 months.  I will see him again in 6 months.   Medication Adjustments/Labs and Tests Ordered: Current medicines are reviewed at length with the patient today.  Concerns regarding medicines are outlined above.  Orders Placed This Encounter  Procedures  . Lipid Profile  . Basic Metabolic Panel (BMET)  . Hepatic function panel   Meds ordered this encounter  Medications  . atorvastatin (LIPITOR) 20 MG tablet    Sig: Take 1 tablet (20 mg total) by mouth  every other day.    Dispense:  45 tablet    Refill:  3     Signed, Mertie Moores, MD  12/18/2019 11:04 AM    [place]

## 2019-12-27 ENCOUNTER — Emergency Department (HOSPITAL_COMMUNITY): Payer: BC Managed Care – PPO

## 2019-12-27 ENCOUNTER — Other Ambulatory Visit: Payer: Self-pay

## 2019-12-27 ENCOUNTER — Encounter (HOSPITAL_COMMUNITY): Payer: Self-pay | Admitting: Emergency Medicine

## 2019-12-27 ENCOUNTER — Emergency Department (HOSPITAL_COMMUNITY)
Admission: EM | Admit: 2019-12-27 | Discharge: 2019-12-27 | Disposition: A | Discharge status: Home / Self Care | Payer: BC Managed Care – PPO | Attending: Emergency Medicine | Admitting: Emergency Medicine

## 2019-12-27 DIAGNOSIS — Z955 Presence of coronary angioplasty implant and graft: Secondary | ICD-10-CM | POA: Insufficient documentation

## 2019-12-27 DIAGNOSIS — Z79899 Other long term (current) drug therapy: Secondary | ICD-10-CM | POA: Diagnosis not present

## 2019-12-27 DIAGNOSIS — Z87891 Personal history of nicotine dependence: Secondary | ICD-10-CM | POA: Insufficient documentation

## 2019-12-27 DIAGNOSIS — I2510 Atherosclerotic heart disease of native coronary artery without angina pectoris: Secondary | ICD-10-CM | POA: Diagnosis not present

## 2019-12-27 DIAGNOSIS — R079 Chest pain, unspecified: Secondary | ICD-10-CM | POA: Diagnosis not present

## 2019-12-27 DIAGNOSIS — R0789 Other chest pain: Secondary | ICD-10-CM | POA: Diagnosis not present

## 2019-12-27 LAB — BASIC METABOLIC PANEL
Anion gap: 8 (ref 5–15)
BUN: 16 mg/dL (ref 6–20)
CO2: 26 mmol/L (ref 22–32)
Calcium: 9.9 mg/dL (ref 8.9–10.3)
Chloride: 102 mmol/L (ref 98–111)
Creatinine, Ser: 1.21 mg/dL (ref 0.61–1.24)
GFR calc Af Amer: >60 mL/min (ref >60)
GFR calc non Af Amer: >60 mL/min (ref >60)
Glucose, Bld: 78 mg/dL (ref 70–99)
Potassium: 4.4 mmol/L (ref 3.5–5.1)
Sodium: 136 mmol/L (ref 135–145)

## 2019-12-27 LAB — CBC
HCT: 40.9 % (ref 39.0–52.0)
Hemoglobin: 14.5 g/dL (ref 13.0–17.0)
MCH: 30.4 pg (ref 26.0–34.0)
MCHC: 35.5 g/dL (ref 30.0–36.0)
MCV: 85.7 fL (ref 80.0–100.0)
Platelets: 225 10*3/uL (ref 150–400)
RBC: 4.77 MIL/uL (ref 4.22–5.81)
RDW: 12.1 % (ref 11.5–15.5)
WBC: 6.9 10*3/uL (ref 4.0–10.5)
nRBC: 0 % (ref 0.0–0.2)

## 2019-12-27 LAB — TROPONIN I (HIGH SENSITIVITY)
Troponin I (High Sensitivity): 3 ng/L (ref <18)
Troponin I (High Sensitivity): 3 ng/L (ref <18)

## 2019-12-27 MED ORDER — SODIUM CHLORIDE 0.9% FLUSH
3.0000 mL | Freq: Once | INTRAVENOUS | Status: AC
  Administered 2019-12-27: 3 mL via INTRAVENOUS

## 2019-12-27 NOTE — ED Provider Notes (Signed)
[facility] DEPT Provider Note   CSN: [id number] Arrival date & time: 12/27/19  [id number]     History Chief Complaint  Patient presents with  . Chest Pain    Charles Montoya is a 49 y.o. male.  Past medical history hyperlipidemia, CAD, tobacco abuse presents to ER with concern for episode of chest pain.  Patient states woke up this morning with tingling sensation across his chest.  Last set of 2 to 3 minutes, cardiac arrest, resolved spontaneously.  No frank pain, does not have any ongoing symptoms now.  States that he has not had any recent symptoms of chest pain with exertion.  No associated shortness of breath.  Additional history obtained via chart review, review of recent cardiology visit, cath report  Cath 01/12/2017, DES to RPDA  HPI     Past Medical History:  Diagnosis Date  . Anxiety   . CAD (coronary artery disease)    a. cath 12/2016--> s/p DES to RPDA, 50% dRCA, 25% pLAD, 40% mLAD, 25% pro to mid CX  . Tobacco abuse     Patient Active Problem List   Diagnosis Date Noted  . Familial hyperlipidemia 05/18/2018  . Statin intolerance 05/18/2018  . Mixed hyperlipidemia 02/01/2017  . Cardiac enzymes elevated   . Coronary artery disease involving native coronary artery of native heart without angina pectoris   . Chest pain 01/18/2017  . Tobacco abuse 01/18/2017    Past Surgical History:  Procedure Laterality Date  . CARDIAC CATHETERIZATION    . CORONARY STENT INTERVENTION N/A 01/19/2017   Procedure: Coronary Stent Intervention;  Surgeon: Jettie Booze, MD;  Location: [facility];  Service: Cardiovascular;  Laterality: N/A;  . HERNIA REPAIR    . LEFT HEART CATH AND CORONARY ANGIOGRAPHY N/A 01/19/2017   Procedure: Left Heart Cath and Coronary Angiography;  Surgeon: Jettie Booze, MD;  Location: [facility];  Service: Cardiovascular;  Laterality: N/A;       Family History  Problem Relation Age of Onset  . CAD  Father   . Hypertension Mother     Social History   Tobacco Use  . Smoking status: Former Smoker    Packs/day: 1.50    Types: Cigarettes    Quit date: 01/19/2017    Years since quitting: 2.9  . Smokeless tobacco: Never Used  . Tobacco comment: Using nicoderm gum  Substance Use Topics  . Alcohol use: No  . Drug use: No    Home Medications Prior to Admission medications   Medication Sig Start Date End Date Taking? Authorizing Provider  ALPRAZolam Duanne Moron) 0.5 MG tablet Take 0.25-0.5 mg by mouth 3 (three) times daily as needed for anxiety or sleep. Anxiety    Yes [provider]  atorvastatin (LIPITOR) 20 MG tablet Take 1 tablet (20 mg total) by mouth every other day. 12/18/19  Yes Nahser, Wonda Cheng, MD  calcium carbonate (TUMS - DOSED IN MG ELEMENTAL CALCIUM) 500 MG chewable tablet Chew 2 tablets by mouth 4 (four) times daily as needed for indigestion or heartburn.   Yes [provider]  clomiPRAMINE (ANAFRANIL) 25 MG capsule Take 25 mg by mouth at bedtime.   Yes [provider]  nicotine polacrilex (NICORETTE) 4 MG gum Take 4 mg by mouth as needed for smoking cessation.   Yes [provider]  nitroGLYCERIN (NITROSTAT) 0.4 MG SL tablet 1 TAB UNDER TONGUE AS NEEDED FOR CHEST PAIN. MAY REPEAT EVERY 5 MIN FOR A TOTAL  OF 3 DOSES. Patient taking differently: Place 0.4 mg under the tongue every 5 (five) minutes as needed for chest pain.  10/01/19  Yes Jettie Booze, MD  ticagrelor (BRILINTA) 60 MG TABS tablet Take 1 tablet (60 mg total) by mouth 2 (two) times daily. 04/27/19  Yes Nahser, Wonda Cheng, MD  cyclobenzaprine (FLEXERIL) 10 MG tablet Take 1 tablet (10 mg total) by mouth 2 (two) times daily as needed for muscle spasms. Patient not taking: Reported on 12/27/2019 08/01/17   Montine Circle, PA-C  ezetimibe (ZETIA) 10 MG tablet Take 1 tablet (10 mg total) by mouth daily. Patient not taking: Reported on 12/27/2019 10/19/19 01/17/20  Pixie Casino, MD     Allergies    Codeine, Lactose intolerance (gi), Other, Praluent [alirocumab], Repatha [evolocumab], and Statins  Review of Systems   Review of Systems  Constitutional: Negative for chills and fever.  HENT: Negative for ear pain and sore throat.   Eyes: Negative for pain and visual disturbance.  Respiratory: Negative for cough and shortness of breath.   Cardiovascular: Positive for chest pain. Negative for palpitations.  Gastrointestinal: Negative for abdominal pain and vomiting.  Genitourinary: Negative for dysuria and hematuria.  Musculoskeletal: Negative for arthralgias and back pain.  Skin: Negative for color change and rash.  Neurological: Negative for seizures and syncope.  All other systems reviewed and are negative.   Physical Exam Updated Vital Signs BP (!) 128/95   Pulse 64   Temp 98.3 F (36.8 C) (Oral)   Resp 15   Ht 5\' 10"  (1.778 m)   Wt 79.4 kg   SpO2 97%   BMI 25.11 kg/m   Physical Exam Vitals and nursing note reviewed.  Constitutional:      Appearance: He is well-developed.  HENT:     Head: Normocephalic and atraumatic.  Eyes:     Conjunctiva/sclera: Conjunctivae normal.  Cardiovascular:     Rate and Rhythm: Normal rate and regular rhythm.     Heart sounds: No murmur.  Pulmonary:     Effort: Pulmonary effort is normal. No respiratory distress.     Breath sounds: Normal breath sounds.  Abdominal:     Palpations: Abdomen is soft.     Tenderness: There is no abdominal tenderness.  Musculoskeletal:     Cervical back: Neck supple.     Right lower leg: No tenderness. No edema.     Left lower leg: No tenderness. No edema.  Skin:    General: Skin is warm and dry.     Capillary Refill: Capillary refill takes less than 2 seconds.  Neurological:     General: No focal deficit present.     Mental Status: He is alert and oriented to person, place, and time.     ED Results / Procedures / Treatments   Labs (all labs ordered are listed, but only  abnormal results are displayed) Labs Reviewed  BASIC METABOLIC PANEL  CBC  TROPONIN I (HIGH SENSITIVITY)  TROPONIN I (HIGH SENSITIVITY)    EKG EKG Interpretation  Date/Time:  Thursday December 27 2019 08:48:35 EDT Ventricular Rate:  72 PR Interval:    QRS Duration: 106 QT Interval:  383 QTC Calculation: 420 R Axis:   77 Text Interpretation: Sinus rhythm Probable inferior infarct, old 12 Lead; Mason-Likar No significant change since last tracing Confirmed by Madalyn Rob [id number]) on 12/27/2019 8:56:44 AM   Radiology DG Chest 2 View  Result Date: 12/27/2019 CLINICAL DATA:  Chest pain. EXAM: CHEST - 2 VIEW COMPARISON:  08/01/2017 FINDINGS: Cardiomediastinal contours and hilar structures are normal. Lungs are clear.  No evidence of pleural effusion. Visualized skeletal structures are unremarkable. IMPRESSION: No acute cardiopulmonary disease. Electronically Signed   By: Zetta Bills M.D.   On: 12/27/2019 09:46    Procedures Procedures (including critical care time)  Medications Ordered in ED Medications  sodium chloride flush (NS) 0.9 % injection 3 mL (3 mLs Intravenous Given 12/27/19 0911)    ED Course  I have reviewed the triage vital signs and the nursing notes.  Pertinent labs & imaging results that were available during my care of the patient were reviewed by me and considered in my medical decision making (see chart for details).  Clinical Course as of Dec 27 1138  Thu Dec 27, 2019  1119 Repeat trop neg, will update and dc  Troponin I (High Sensitivity): 3 [RD]    Clinical Course User Index [RD] Lucrezia Starch, MD   MDM Rules/Calculators/A&P                      49 year old male who presented to ER with concern for episode of chest discomfort this morning.  Has notable history for CAD, LHC in 2018 90% RPDA, stent placement.  Here symptoms had completely resolved, no ongoing symptoms.  EKG without acute ischemic changes, troponin x2 within normal limits.  Given  these findings and patient's description of pain, very low suspicion for ACS.  CXR negative, remainder of labs normal.  Given his reassuring work-up today, believe patient can be managed in the outpatient setting.  Recommended that he have follow-up both with his primary doctor as well as his cardiologist again.  Reviewed strict return precautions and discharged home.    After the discussed management above, the patient was determined to be safe for discharge.  The patient was in agreement with this plan and all questions regarding their care were answered.  ED return precautions were discussed and the patient will return to the ED with any significant worsening of condition.  Final Clinical Impression(s) / ED Diagnoses Final diagnoses:  Chest pain, unspecified type    Rx / DC Orders ED Discharge Orders    None       Lucrezia Starch, MD 12/27/19 1143

## 2019-12-27 NOTE — Discharge Instructions (Addendum)
Recommend scheduling follow-up appointment with your cardiologist regarding the episode you experience today.  If you have recurrent chest pain, any difficulty in breathing or other new concerns of to go please return to ER for reassessment.

## 2019-12-27 NOTE — ED Triage Notes (Signed)
Pt has cardiac hx, had stent place 3 years ago. Reports woke up today with chest pains and bilat arm tingling. Takes blood thinners.

## 2020-03-19 ENCOUNTER — Other Ambulatory Visit: Payer: BC Managed Care – PPO

## 2020-03-22 ENCOUNTER — Emergency Department (HOSPITAL_COMMUNITY): Payer: BC Managed Care – PPO

## 2020-03-22 ENCOUNTER — Other Ambulatory Visit: Payer: Self-pay

## 2020-03-22 ENCOUNTER — Encounter (HOSPITAL_COMMUNITY): Payer: Self-pay | Admitting: Emergency Medicine

## 2020-03-22 DIAGNOSIS — R079 Chest pain, unspecified: Secondary | ICD-10-CM | POA: Diagnosis not present

## 2020-03-22 DIAGNOSIS — M542 Cervicalgia: Secondary | ICD-10-CM | POA: Diagnosis not present

## 2020-03-22 DIAGNOSIS — Z79899 Other long term (current) drug therapy: Secondary | ICD-10-CM | POA: Insufficient documentation

## 2020-03-22 DIAGNOSIS — Z87891 Personal history of nicotine dependence: Secondary | ICD-10-CM | POA: Insufficient documentation

## 2020-03-22 DIAGNOSIS — R6884 Jaw pain: Secondary | ICD-10-CM | POA: Insufficient documentation

## 2020-03-22 DIAGNOSIS — I2510 Atherosclerotic heart disease of native coronary artery without angina pectoris: Secondary | ICD-10-CM | POA: Insufficient documentation

## 2020-03-22 LAB — BASIC METABOLIC PANEL
Anion gap: 13 (ref 5–15)
BUN: 14 mg/dL (ref 6–20)
CO2: 24 mmol/L (ref 22–32)
Calcium: 9.8 mg/dL (ref 8.9–10.3)
Chloride: 102 mmol/L (ref 98–111)
Creatinine, Ser: 1.41 mg/dL — ABNORMAL HIGH (ref 0.61–1.24)
GFR calc Af Amer: >60 mL/min (ref >60)
GFR calc non Af Amer: 59 mL/min — ABNORMAL LOW (ref >60)
Glucose, Bld: 85 mg/dL (ref 70–99)
Potassium: 3.6 mmol/L (ref 3.5–5.1)
Sodium: 139 mmol/L (ref 135–145)

## 2020-03-22 LAB — CBC
HCT: 39.2 % (ref 39.0–52.0)
Hemoglobin: 13.8 g/dL (ref 13.0–17.0)
MCH: 30.3 pg (ref 26.0–34.0)
MCHC: 35.2 g/dL (ref 30.0–36.0)
MCV: 86 fL (ref 80.0–100.0)
Platelets: 240 10*3/uL (ref 150–400)
RBC: 4.56 MIL/uL (ref 4.22–5.81)
RDW: 12.4 % (ref 11.5–15.5)
WBC: 7.7 10*3/uL (ref 4.0–10.5)
nRBC: 0 % (ref 0.0–0.2)

## 2020-03-22 LAB — TROPONIN I (HIGH SENSITIVITY): Troponin I (High Sensitivity): 2 ng/L (ref <18)

## 2020-03-22 NOTE — ED Triage Notes (Signed)
Patient here from home with complaints of cental chest pain radiating into neck and down left arm that started 3 days ago. Hx of same. Reports stents.

## 2020-03-23 ENCOUNTER — Emergency Department (HOSPITAL_COMMUNITY)
Admission: EM | Admit: 2020-03-23 | Discharge: 2020-03-23 | Disposition: A | Discharge status: Home / Self Care | Payer: BC Managed Care – PPO | Attending: Emergency Medicine | Admitting: Emergency Medicine

## 2020-03-23 DIAGNOSIS — M542 Cervicalgia: Secondary | ICD-10-CM

## 2020-03-23 LAB — TROPONIN I (HIGH SENSITIVITY): Troponin I (High Sensitivity): 3 ng/L (ref <18)

## 2020-03-23 NOTE — ED Provider Notes (Signed)
[facility] DEPT Provider Note   CSN: [id number] Arrival date & time: 03/22/20  1707     History Chief Complaint  Patient presents with  . Chest Pain  . Neck Pain    Charles Montoya is a 49 y.o. male.  49 y/o male with hx of CAD s/p DES to RPDA, dyslipidemia, anxiety, hx of tobacco abuse (quit 2018) presents to the ED for evaluation. C/o recurrent "sensation" to his posterior neck which radiates to his bilateral shoulders. States "sensation" is not painful and lasts no more than 30 seconds before resolving. Denies taking any medications for symptoms. No alleviation or aggravation with massage of the area.   Has experienced these symptoms sporadically for quite some time. Happened randomly for a few consecutive days >1 week ago, then again this past Monday. Patient subsequently had no symptom recurrence until he felt this same sensation yesterday morning and decided to be evaluated in the ED. Symptoms occur mostly while at rest. They are not brought on by activity/exertion. In contrary to triage note, patient DENIES associated chest pain. Has sensation of indigestion when prior stent placed, but has not had this feeling either. No SOB, lightheadedness, N/V, syncope, leg swelling, fevers.   Cards - Dr. Acie Fredrickson, Dr. Debara Pickett   Neck Pain   HPI: A 49 year old patient with a history of hypercholesterolemia presents for evaluation of chest pain. Initial onset of pain was more than 6 hours ago. The patient's chest pain is not worse with exertion. The patient's chest pain is not middle- or left-sided, is not well-localized, is not described as heaviness/pressure/tightness, is not sharp and does radiate to the arms/jaw/neck. The patient does not complain of nausea and denies diaphoresis. The patient has no history of stroke, has no history of peripheral artery disease, has not smoked in the past 90 days, denies any history of treated diabetes, has no relevant family history of  coronary artery disease (first degree relative at less than age 53), is not hypertensive and does not have an elevated BMI (>=30).   Past Medical History:  Diagnosis Date  . Anxiety   . CAD (coronary artery disease)    a. cath 12/2016--> s/p DES to RPDA, 50% dRCA, 25% pLAD, 40% mLAD, 25% pro to mid CX  . Tobacco abuse     Patient Active Problem List   Diagnosis Date Noted  . Familial hyperlipidemia 05/18/2018  . Statin intolerance 05/18/2018  . Mixed hyperlipidemia 02/01/2017  . Cardiac enzymes elevated   . Coronary artery disease involving native coronary artery of native heart without angina pectoris   . Chest pain 01/18/2017  . Tobacco abuse 01/18/2017    Past Surgical History:  Procedure Laterality Date  . CARDIAC CATHETERIZATION    . CORONARY STENT INTERVENTION N/A 01/19/2017   Procedure: Coronary Stent Intervention;  Surgeon: Jettie Booze, MD;  Location: [facility];  Service: Cardiovascular;  Laterality: N/A;  . HERNIA REPAIR    . LEFT HEART CATH AND CORONARY ANGIOGRAPHY N/A 01/19/2017   Procedure: Left Heart Cath and Coronary Angiography;  Surgeon: Jettie Booze, MD;  Location: [facility];  Service: Cardiovascular;  Laterality: N/A;       Family History  Problem Relation Age of Onset  . CAD Father   . Hypertension Mother     Social History   Tobacco Use  . Smoking status: Former Smoker    Packs/day: 1.50    Types: Cigarettes    Quit date: 01/19/2017  Years since quitting: 3.1  . Smokeless tobacco: Never Used  . Tobacco comment: Using nicoderm gum  Vaping Use  . Vaping Use: Never assessed  Substance Use Topics  . Alcohol use: No  . Drug use: No    Home Medications Prior to Admission medications   Medication Sig Start Date End Date Taking? Authorizing Provider  ALPRAZolam Duanne Moron) 0.5 MG tablet Take 0.25-0.5 mg by mouth 3 (three) times daily as needed for anxiety or sleep. Anxiety     [provider]  atorvastatin  (LIPITOR) 20 MG tablet Take 1 tablet (20 mg total) by mouth every other day. 12/18/19   Nahser, Wonda Cheng, MD  calcium carbonate (TUMS - DOSED IN MG ELEMENTAL CALCIUM) 500 MG chewable tablet Chew 2 tablets by mouth 4 (four) times daily as needed for indigestion or heartburn.    [provider]  clomiPRAMINE (ANAFRANIL) 25 MG capsule Take 25 mg by mouth at bedtime.    [provider]  cyclobenzaprine (FLEXERIL) 10 MG tablet Take 1 tablet (10 mg total) by mouth 2 (two) times daily as needed for muscle spasms. Patient not taking: Reported on 12/27/2019 08/01/17   Montine Circle, PA-C  ezetimibe (ZETIA) 10 MG tablet Take 1 tablet (10 mg total) by mouth daily. Patient not taking: Reported on 12/27/2019 10/19/19 01/17/20  Pixie Casino, MD  nicotine polacrilex (NICORETTE) 4 MG gum Take 4 mg by mouth as needed for smoking cessation.    [provider]  nitroGLYCERIN (NITROSTAT) 0.4 MG SL tablet 1 TAB UNDER TONGUE AS NEEDED FOR CHEST PAIN. MAY REPEAT EVERY 5 MIN FOR A TOTAL OF 3 DOSES. Patient taking differently: Place 0.4 mg under the tongue every 5 (five) minutes as needed for chest pain.  10/01/19   Jettie Booze, MD  ticagrelor (BRILINTA) 60 MG TABS tablet Take 1 tablet (60 mg total) by mouth 2 (two) times daily. 04/27/19   Nahser, Wonda Cheng, MD    Allergies    Codeine, Lactose intolerance (gi), Other, Praluent [alirocumab], Repatha [evolocumab], and Statins  Review of Systems   Review of Systems  Musculoskeletal: Positive for neck pain.  Ten systems reviewed and are negative for acute change, except as noted in the HPI.    Physical Exam Updated Vital Signs BP (!) 145/105   Pulse 65   Temp 98.6 F (37 C) (Oral)   Resp 14   SpO2 100%   Physical Exam Vitals and nursing note reviewed.  Constitutional:      General: He is not in acute distress.    Appearance: He is well-developed. He is not diaphoretic.     Comments: Nontoxic and in NAD  HENT:     Head:  Normocephalic and atraumatic.  Eyes:     General: No scleral icterus.    Conjunctiva/sclera: Conjunctivae normal.  Pulmonary:     Effort: Pulmonary effort is normal. No respiratory distress.     Comments: Respirations even and unlabored Musculoskeletal:        General: Normal range of motion.     Cervical back: Normal range of motion.     Right lower leg: No edema.     Left lower leg: No edema.  Skin:    General: Skin is warm and dry.     Coloration: Skin is not pale.     Findings: No erythema or rash.  Neurological:     Mental Status: He is alert and oriented to person, place, and time.  Psychiatric:  Behavior: Behavior normal.     ED Results / Procedures / Treatments   Labs (all labs ordered are listed, but only abnormal results are displayed) Labs Reviewed  BASIC METABOLIC PANEL - Abnormal; Notable for the following components:      Result Value   Creatinine, Ser 1.41 (*)    GFR calc non Af Amer 59 (*)    All other components within normal limits  CBC  TROPONIN I (HIGH SENSITIVITY)  TROPONIN I (HIGH SENSITIVITY)    EKG EKG Interpretation  Date/Time:  Saturday March 22 2020 17:11:03 EDT Ventricular Rate:  81 PR Interval:  122 QRS Duration: 100 QT Interval:  356 QTC Calculation: 413 R Axis:   66 Text Interpretation: Normal sinus rhythm Artifact No significant change since last tracing Abnormal ECG Confirmed by Carmin Muskrat [phone]) on 03/22/2020 5:17:30 PM   Radiology DG Chest 2 View  Result Date: 03/22/2020 CLINICAL DATA:  Patient here from home with complaints of cental chest pain radiating into his neck and down his left arm that started 3 days ago. Hx of same. Pt reported a medical hx of CAD and stents. EXAM: CHEST - 2 VIEW COMPARISON:  Chest radiograph 12/27/2019 FINDINGS: The cardiomediastinal contours are within normal limits. The lungs are clear. No pneumothorax or pleural effusion. No acute finding in the visualized skeleton. IMPRESSION: No acute  cardiopulmonary finding. Electronically Signed   By: Audie Pinto M.D.   On: 03/22/2020 17:55    Procedures Procedures (including critical care time)  Medications Ordered in ED Medications - No data to display  ED Course  I have reviewed the triage vital signs and the nursing notes.  Pertinent labs & imaging results that were available during my care of the patient were reviewed by me and considered in my medical decision making (see chart for details).  Clinical Course as of Mar 24 627  Nancy Fetter Mar 23, 2020  0603 Spoke with Dr. Juliane Lack of Cardiology to try and facilitate close outpatient follow up given patient's history and risk factors. Anticipate discharge if repeat troponin negative.   [KH]    Clinical Course User Index [KH] Antonietta Breach, PA-C   MDM Rules/Calculators/A&P HEAR Score: 3                        Patient presents to the emergency department for evaluation of sensation to posterior neck radiating to b/l shoulders and arms. Sporadic for >1 week, nonexertional.  Atypically presenting ACS considered given history, though work up reassuring.  NO associated chest pain.  EKG is stable compared to prior and troponin negative x 2.  Chest x-ray without evidence of mediastinal widening to suggest dissection.  No pneumothorax, pneumonia, pleural effusion.    Question MSK etiology. Patient also with anxiety which could perpetuate symptoms. Did touch base with cardiology regarding coordination of close outpatient follow up given hx of CAD s/p DES, dyslipidemia resistant to medication management. Patient encourage to return for new or concerning symptoms. Discharged in stable condition with no unaddressed concerns.   Final Clinical Impression(s) / ED Diagnoses Final diagnoses:  Neck pain    Rx / DC Orders ED Discharge Orders    None       Antonietta Breach, PA-C 45/40/98 1191    Delora Fuel, MD 47/82/95 [phone]

## 2020-03-23 NOTE — Discharge Instructions (Addendum)
Your evaluation in the emergency department this evening was reassuring and did not reveal a concerning cause of your symptoms.  Still, we advise outpatient follow-up with your cardiologist.  You would likely benefit from a stress test given your history.  Return to the ED for new or concerning symptoms.

## 2020-03-24 ENCOUNTER — Telehealth: Payer: Self-pay | Admitting: Cardiovascular Disease

## 2020-03-24 DIAGNOSIS — I259 Chronic ischemic heart disease, unspecified: Secondary | ICD-10-CM

## 2020-03-24 NOTE — Telephone Encounter (Signed)
Spoke with patient who states he is having episodes of chest pains that feel like the pain he had prior to previous coronary stenting. He has been to the ED on 2 separate occasions, last one yesterday and troponins have been normal. Denies SOB, n/v, diaphoresis. States pain occurs when he is at rest. He has not taken NTG. Is not currently taking cholesterol lowering meds due to intolerance. He would like to have a stress test. I advised that I will discuss with Dr. Acie Fredrickson and call him back with his advice. He verbalized understanding and agreement and thanked me for the call.

## 2020-03-24 NOTE — Telephone Encounter (Signed)
New message:     Patient stating that he need to have a stress. There is not a order. Patient just got of hospital Sunday morning.

## 2020-03-24 NOTE — Telephone Encounter (Signed)
Lets get a exercise myoview  He is scheduled to see Dr. Debara Pickett for lipid lowering advice.

## 2020-03-24 NOTE — Telephone Encounter (Signed)
Spoke with patient and reviewed Dr. Elmarie Shiley advice. Encouraged him to use NTG as needed for discomfort and to call back with questions or concerns prior to having exercise myoview. He is aware someone will call him to schedule the test and I reviewed basic instructions with him. He thanked me for the call.

## 2020-04-03 ENCOUNTER — Telehealth (HOSPITAL_COMMUNITY): Payer: Self-pay

## 2020-04-03 NOTE — Telephone Encounter (Signed)
Detailed instructions left on the patient's answering machine. Asked to call back with any questions. S.Shaelyn Decarli EMTP 

## 2020-04-04 ENCOUNTER — Other Ambulatory Visit (HOSPITAL_COMMUNITY)
Admission: RE | Admit: 2020-04-04 | Discharge: 2020-04-04 | Disposition: A | Discharge status: Home / Self Care | Payer: BC Managed Care – PPO | Source: Ambulatory Visit | Attending: Cardiovascular Disease | Admitting: Cardiovascular Disease

## 2020-04-04 DIAGNOSIS — Z20822 Contact with and (suspected) exposure to covid-19: Secondary | ICD-10-CM | POA: Diagnosis not present

## 2020-04-04 DIAGNOSIS — Z01812 Encounter for preprocedural laboratory examination: Secondary | ICD-10-CM | POA: Insufficient documentation

## 2020-04-04 LAB — SARS CORONAVIRUS 2 (TAT 6-24 HRS): SARS Coronavirus 2: NEGATIVE

## 2020-04-08 ENCOUNTER — Ambulatory Visit (HOSPITAL_COMMUNITY): Payer: BC Managed Care – PPO | Attending: Cardiology

## 2020-04-08 ENCOUNTER — Other Ambulatory Visit: Payer: Self-pay

## 2020-04-08 DIAGNOSIS — I259 Chronic ischemic heart disease, unspecified: Secondary | ICD-10-CM | POA: Diagnosis not present

## 2020-04-08 LAB — MYOCARDIAL PERFUSION IMAGING
LV dias vol: 91 mL (ref 62–150)
LV sys vol: 36 mL
Peak HR: 126 {beats}/min
Rest HR: 70 {beats}/min
SDS: 3
SRS: 0
SSS: 3
TID: 0.95

## 2020-04-08 MED ORDER — REGADENOSON 0.4 MG/5ML IV SOLN
0.4000 mg | Freq: Once | INTRAVENOUS | Status: AC
  Administered 2020-04-08: 0.4 mg via INTRAVENOUS

## 2020-04-08 MED ORDER — TECHNETIUM TC 99M TETROFOSMIN IV KIT
11.0000 | PACK | Freq: Once | INTRAVENOUS | Status: AC | PRN
  Administered 2020-04-08: 11 via INTRAVENOUS
  Filled 2020-04-08: qty 11

## 2020-04-08 MED ORDER — TECHNETIUM TC 99M TETROFOSMIN IV KIT
30.6000 | PACK | Freq: Once | INTRAVENOUS | Status: AC | PRN
  Administered 2020-04-08: 30.6 via INTRAVENOUS
  Filled 2020-04-08: qty 31

## 2020-05-07 ENCOUNTER — Other Ambulatory Visit: Payer: Self-pay | Admitting: Cardiovascular Disease

## 2020-05-29 DIAGNOSIS — F4010 Social phobia, unspecified: Secondary | ICD-10-CM | POA: Diagnosis not present

## 2020-06-07 ENCOUNTER — Other Ambulatory Visit: Payer: Self-pay | Admitting: Cardiovascular Disease

## 2020-06-09 ENCOUNTER — Other Ambulatory Visit: Payer: Self-pay

## 2020-06-09 DIAGNOSIS — Z789 Other specified health status: Secondary | ICD-10-CM

## 2020-06-09 DIAGNOSIS — E782 Mixed hyperlipidemia: Secondary | ICD-10-CM

## 2020-06-09 DIAGNOSIS — E785 Hyperlipidemia, unspecified: Secondary | ICD-10-CM

## 2020-06-09 DIAGNOSIS — I2510 Atherosclerotic heart disease of native coronary artery without angina pectoris: Secondary | ICD-10-CM

## 2020-06-10 LAB — HEPATIC FUNCTION PANEL
ALT: 20 IU/L (ref 0–44)
AST: 17 IU/L (ref 0–40)
Albumin: 4.9 g/dL (ref 4.0–5.0)
Alkaline Phosphatase: 70 IU/L (ref 44–121)
Bilirubin Total: 0.3 mg/dL (ref 0.0–1.2)
Bilirubin, Direct: <0.1 mg/dL (ref 0.00–0.40)
Total Protein: 7 g/dL (ref 6.0–8.5)

## 2020-06-10 LAB — BASIC METABOLIC PANEL
BUN/Creatinine Ratio: 13 (ref 9–20)
BUN: 16 mg/dL (ref 6–24)
CO2: 24 mmol/L (ref 20–29)
Calcium: 10.4 mg/dL — ABNORMAL HIGH (ref 8.7–10.2)
Chloride: 102 mmol/L (ref 96–106)
Creatinine, Ser: 1.26 mg/dL (ref 0.76–1.27)
GFR calc Af Amer: 77 mL/min/{1.73_m2} (ref >59)
GFR calc non Af Amer: 67 mL/min/{1.73_m2} (ref >59)
Glucose: 93 mg/dL (ref 65–99)
Potassium: 4.2 mmol/L (ref 3.5–5.2)
Sodium: 141 mmol/L (ref 134–144)

## 2020-06-10 LAB — LIPID PANEL
Chol/HDL Ratio: 7.8 ratio — ABNORMAL HIGH (ref 0.0–5.0)
Cholesterol, Total: 282 mg/dL — ABNORMAL HIGH (ref 100–199)
HDL: 36 mg/dL — ABNORMAL LOW (ref >39)
LDL Chol Calc (NIH): 211 mg/dL — ABNORMAL HIGH (ref 0–99)
Triglycerides: 182 mg/dL — ABNORMAL HIGH (ref 0–149)
VLDL Cholesterol Cal: 35 mg/dL (ref 5–40)

## 2020-06-12 ENCOUNTER — Encounter: Payer: Self-pay | Admitting: Internal Medicine

## 2020-06-12 ENCOUNTER — Other Ambulatory Visit: Payer: Self-pay

## 2020-06-12 ENCOUNTER — Ambulatory Visit (INDEPENDENT_AMBULATORY_CARE_PROVIDER_SITE_OTHER): Payer: BC Managed Care – PPO | Admitting: Internal Medicine

## 2020-06-12 VITALS — BP 138/76 | HR 76 | Ht 70.5 in | Wt 189.6 lb

## 2020-06-12 DIAGNOSIS — M791 Myalgia, unspecified site: Secondary | ICD-10-CM | POA: Diagnosis not present

## 2020-06-12 DIAGNOSIS — E785 Hyperlipidemia, unspecified: Secondary | ICD-10-CM | POA: Diagnosis not present

## 2020-06-12 DIAGNOSIS — T466X5A Adverse effect of antihyperlipidemic and antiarteriosclerotic drugs, initial encounter: Secondary | ICD-10-CM | POA: Diagnosis not present

## 2020-06-12 DIAGNOSIS — I2510 Atherosclerotic heart disease of native coronary artery without angina pectoris: Secondary | ICD-10-CM | POA: Diagnosis not present

## 2020-06-12 MED ORDER — NEXLETOL 180 MG PO TABS: 1.0000 | ORAL_TABLET | Freq: Every day | ORAL | 3 refills | Status: DC

## 2020-06-12 NOTE — Progress Notes (Signed)
OFFICE CONSULT NOTE  Chief Complaint:  Follow-up dyslipidemia  Primary Care Physician: Lujean Amel, MD  HPI:  Charles Montoya is a 49 y.o. male who is being seen today for the evaluation of elevated cholesterol at the request of Liam Rogers, MD. This is a pleasant 49 year old male who unfortunately had early onset heart disease.  In June 2018 he underwent cardiac catheterization and required a stent to the PDA but was also noted to have mild to moderate multivessel disease.  He has a long-standing smoking history but quit smoking at this event.  He also has significant anxiety history.  Family story significant for his father who had heart disease and underwent heart surgery in his 60s.  He is grandfather on his father's side also had coronary disease.  He does not have any children.  Lipid profile in 2018 showed total cholesterol of 253.  April be 155, LDL particle #2334, with predominantly small LDL and an increased small LDL particle number of 1311.  Triglycerides 186.  LDL-C of 180.  He has subsequently been placed on Lipitor 40 mg without significant improvement in his numbers and that dose was then increased up to 80 mg.  He had already started to experience some side effects from atorvastatin however on the 80 mg dose had significant fatigue and achiness to the point where he had trouble getting out of bed.  He was taken off that and his symptoms resolved.  Subsequently he was tried on rosuvastatin with similar side effects again improved after discontinuing the medication.  Finally he was on simvastatin.  Again he had recurrent myalgias.  He was then subsequently started appropriately on Repatha which he took for several months.  His last lipid profile in April 2019 on medication showed marked improvement in his LDL particle numbers down from 2294-898.  LDL-C dropped from 166-71.  This represented an excellent response to therapy, however after a couple of months on injections he again started  to feel significant fatigue, lethargy and soreness with symptoms that were much worse than with the statins.  He was then referred to the lipid clinic for additional treatment options.  08/17/2018  Charles Montoya returns today for follow-up of dyslipidemia.  He had repeat lipid testing 3 days ago which showed no significant improvement in his lipid profile.  Total cholesterol was 233, HDL 36, LDL 142 and triglycerides 275.  On fortunately as he was intolerant to Repatha, he was switched to Praluent.  He did have one home injection of Praluent and felt significant fatigue and lassisitude.  He said he fell into a deep depression and was fairly lethargic.  He attributed this to the medication and did not continue it.  At this point he is reluctant to take either PCSK9 inhibitor or statin medications.  LDL remains above goal and triglycerides are high as well given his history of coronary disease and PCI.  10/19/2019  Charles Montoya returns today for follow-up.  Unfortunately he was not able to tolerate Praluent or Repatha due to significant side effects.  He is also not tolerated statins.  We discussed the possibility of using ezetimibe or bempedoic acid, neither which have been previously trialed.  We also discussed the medication inclisiran which is likely to be FDA approved this year.  We are also currently enrolling in a clinical trial (Orion-4) which is setting this out to 5 years versus placebo.  This is a clinical outcomes trial.  06/12/2020  Charles Montoya is seen today in  follow-up.  We have exhausted a number of options for him regarding management of his dyslipidemia.  Cholesterol continues to climb with his most recent numbers showing a total of 282, triglycerides 182, HDL 36 and LDL of 211.  He had recently seen Dr. Acie Fredrickson.  We discussed options including clinical trials but he is very hesitant to do that because he is concerned about 6 months of possible side effects related to inclisiran.  I did mention that this  may be commercially available soon.  There is also other medications in development including oral PCSK9 inhibitors.  We discussed the possibility of using Nexletol and this would be available at this point.  He said he would be interested in trying it.  PMHx:  Past Medical History:  Diagnosis Date   Anxiety    CAD (coronary artery disease)    a. cath 12/2016--> s/p DES to RPDA, 50% dRCA, 25% pLAD, 40% mLAD, 25% pro to mid CX   Tobacco abuse     Past Surgical History:  Procedure Laterality Date   CARDIAC CATHETERIZATION     CORONARY STENT INTERVENTION N/A 01/19/2017   Procedure: Coronary Stent Intervention;  Surgeon: Jettie Booze, MD;  Location: [facility];  Service: Cardiovascular;  Laterality: N/A;   HERNIA REPAIR     LEFT HEART CATH AND CORONARY ANGIOGRAPHY N/A 01/19/2017   Procedure: Left Heart Cath and Coronary Angiography;  Surgeon: Jettie Booze, MD;  Location: [facility];  Service: Cardiovascular;  Laterality: N/A;    FAMHx:  Family History  Problem Relation Age of Onset   CAD Father    Hypertension Mother     SOCHx:   reports that he quit smoking about 3 years ago. His smoking use included cigarettes. He smoked 1.50 packs per day. He has never used smokeless tobacco. He reports that he does not drink alcohol and does not use drugs.  ALLERGIES:  Allergies  Allergen Reactions   Codeine Itching   Lactose Intolerance (Gi) Other (See Comments)    Abdominal pain    Other Itching    MSG food allergy    Praluent [Alirocumab] Other (See Comments)    Fatigue, weakness, back pain, depression    Repatha [Evolocumab] Other (See Comments)    Fatigue, weakness, back pain, depression    Statins Other (See Comments)    Atorvastatin, Rosuvastatin, Simvastatin - generalized fatigue, weakness,  Muscle pain    ROS: Pertinent items noted in HPI and remainder of comprehensive ROS otherwise negative.  HOME MEDS: Current Outpatient  Medications on File Prior to Visit  Medication Sig Dispense Refill   ALPRAZolam (XANAX) 0.5 MG tablet Take 0.25-0.5 mg by mouth 3 (three) times daily as needed for anxiety or sleep. Anxiety      BRILINTA 60 MG TABS tablet TAKE 1 TABLET BY MOUTH TWICE DAILY. 60 tablet 7   calcium carbonate (TUMS - DOSED IN MG ELEMENTAL CALCIUM) 500 MG chewable tablet Chew 2 tablets by mouth 4 (four) times daily as needed for indigestion or heartburn.     clomiPRAMINE (ANAFRANIL) 25 MG capsule Take 25 mg by mouth at bedtime.     cyclobenzaprine (FLEXERIL) 10 MG tablet Take 1 tablet (10 mg total) by mouth 2 (two) times daily as needed for muscle spasms. 20 tablet 0   nicotine polacrilex (NICORETTE) 4 MG gum Take 4 mg by mouth as needed for smoking cessation.     nitroGLYCERIN (NITROSTAT) 0.4 MG SL tablet 1 TAB UNDER TONGUE AS NEEDED  FOR CHEST PAIN. MAY REPEAT EVERY 5 MIN FOR A TOTAL OF 3 DOSES. (Patient taking differently: Place 0.4 mg under the tongue every 5 (five) minutes as needed for chest pain. ) 25 tablet 3   tiZANidine (ZANAFLEX) 4 MG tablet Take 4 mg by mouth 3 (three) times daily.     No current facility-administered medications on file prior to visit.    LABS/IMAGING: No results found for this or any previous visit (from the past 48 hour(s)). No results found.  LIPID PANEL:    Component Value Date/Time   CHOL 282 (H) 06/09/2020 1104   TRIG 182 (H) 06/09/2020 1104   TRIG 163 (H) 06/07/2017 0918   HDL 36 (L) 06/09/2020 1104   HDL 34 (L) 06/07/2017 0918   CHOLHDL 7.8 (H) 06/09/2020 1104   CHOLHDL 6.6 01/19/2017 0845   VLDL 48 (H) 01/19/2017 0845   LDLCALC 211 (H) 06/09/2020 1104    WEIGHTS: Wt Readings from Last 3 Encounters:  06/12/20 189 lb 9.6 oz (86 kg)  04/08/20 183 lb (83 kg)  12/27/19 175 lb (79.4 kg)    VITALS: BP 138/76    Pulse 76    Ht 5' 10.5" (1.791 m)    Wt 189 lb 9.6 oz (86 kg)    SpO2 98%    BMI 26.82 kg/m    EXAM: Deferred  EKG: Deferred  ASSESSMENT: 1. CAD s/p PCI to the rPDA (2018) 2. 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 Statin intolerance 4. Intolerant to PCKS9i  PLAN: 1.   Charles Montoya seem to be agreeable to try Nexletol.  We provided samples 180 mg once daily tablet.  If this is tolerated we will plan a repeat lipid in about 3 to 4 months.  I still think he should consider clinical trials as this will likely not give him more than a 30% reduction in his cholesterol and he needs a significant improvement to reduce his risk of coronary disease recurrence.  Plan follow-up with me in about 3 to 4 months.  Pixie Casino, MD, [facility] [place] Director of the Advanced Lipid Disorders &  Cardiovascular Risk Reduction Clinic  Diplomate of the American Board of Clinical Lipidology  Attending Cardiologist  Direct Dial: [phone]   Fax: [phone]  Website:  [email or web address] 06/12/2020, 3:03 PM

## 2020-06-12 NOTE — Patient Instructions (Signed)
.  Medication Instructions:  START nexletol 180mg  daily  *If you need a refill on your cardiac medications before your next appointment, please call your pharmacy*   Lab Work: FASTING lipid panel in 3 months  If you have labs (blood work) drawn today and your tests are completely normal, you will receive your results only by: Marland Kitchen MyChart Message (if you have MyChart) OR . A paper copy in the mail If you have any lab test that is abnormal or we need to change your treatment, we will call you to review the results.   Testing/Procedures: NONE   Follow-Up: At [facility], you and your health needs are our priority.  As part of our continuing mission to provide you with exceptional heart care, we have created designated Provider Care Teams.  These Care Teams include your primary Cardiologist (physician) and Advanced Practice Providers (APPs -  Physician Assistants and Nurse Practitioners) who all work together to provide you with the care you need, when you need it.  We recommend signing up for the patient portal called "MyChart".  Sign up information is provided on this After Visit Summary.  MyChart is used to connect with patients for Virtual Visits (Telemedicine).  Patients are able to view lab/test results, encounter notes, upcoming appointments, etc.  Non-urgent messages can be sent to your provider as well.   To learn more about what you can do with MyChart, go to [email or web address].    Your next appointment:   3 month(s) - lipid clinic  The format for your next appointment:   In Person  Provider:   K. Mali Hilty, MD   Other Instructions

## 2020-06-13 ENCOUNTER — Telehealth: Payer: Self-pay | Admitting: Internal Medicine

## 2020-06-13 NOTE — Telephone Encounter (Signed)
Approved today Effective from 06/13/2020 through 06/12/2023

## 2020-06-13 NOTE — Telephone Encounter (Signed)
PA submitted for Nexletol via CMM  Key: [id number]

## 2020-06-30 ENCOUNTER — Encounter: Payer: Self-pay | Admitting: Internal Medicine

## 2020-08-14 ENCOUNTER — Encounter: Payer: Self-pay | Admitting: Cardiovascular Disease

## 2020-08-14 ENCOUNTER — Encounter: Payer: BC Managed Care – PPO | Admitting: Cardiovascular Disease

## 2020-08-14 NOTE — Progress Notes (Signed)
This encounter was created in error - please disregard.

## 2020-08-27 DIAGNOSIS — E7800 Pure hypercholesterolemia, unspecified: Secondary | ICD-10-CM | POA: Diagnosis not present

## 2020-08-27 DIAGNOSIS — Z0000 Encounter for general adult medical examination without abnormal findings: Secondary | ICD-10-CM | POA: Diagnosis not present

## 2020-08-27 DIAGNOSIS — Z125 Encounter for screening for malignant neoplasm of prostate: Secondary | ICD-10-CM | POA: Diagnosis not present

## 2020-09-19 ENCOUNTER — Ambulatory Visit: Payer: BC Managed Care – PPO | Admitting: Internal Medicine

## 2020-09-24 ENCOUNTER — Ambulatory Visit: Payer: BC Managed Care – PPO | Admitting: Internal Medicine

## 2020-10-01 DIAGNOSIS — Z01818 Encounter for other preprocedural examination: Secondary | ICD-10-CM | POA: Diagnosis not present

## 2020-10-06 ENCOUNTER — Telehealth: Payer: Self-pay

## 2020-10-06 ENCOUNTER — Encounter: Payer: Self-pay | Admitting: Cardiovascular Disease

## 2020-10-06 ENCOUNTER — Ambulatory Visit: Payer: BC Managed Care – PPO | Admitting: Cardiovascular Disease

## 2020-10-06 ENCOUNTER — Other Ambulatory Visit: Payer: Self-pay

## 2020-10-06 VITALS — BP 112/80 | HR 60 | Ht 70.5 in | Wt 191.0 lb

## 2020-10-06 DIAGNOSIS — E782 Mixed hyperlipidemia: Secondary | ICD-10-CM | POA: Diagnosis not present

## 2020-10-06 DIAGNOSIS — I2510 Atherosclerotic heart disease of native coronary artery without angina pectoris: Secondary | ICD-10-CM | POA: Diagnosis not present

## 2020-10-06 NOTE — Patient Instructions (Addendum)
Medication Instructions:  Your physician recommends that you continue on your current medications as directed. Please refer to the Current Medication list given to you today.  *If you need a refill on your cardiac medications before your next appointment, please call your pharmacy*   Lab Work: none   Testing/Procedures: none   Follow-Up: At [facility], you and your health needs are our priority.  As part of our continuing mission to provide you with exceptional heart care, we have created designated Provider Care Teams.  These Care Teams include your primary Cardiologist (physician) and Advanced Practice Providers (APPs -  Physician Assistants and Nurse Practitioners) who all work together to provide you with the care you need, when you need it.   Your next appointment:   1 year(s)  The format for your next appointment:   In Person  Provider:   You may see Mertie Moores, MD or one of the following Advanced Practice Providers on your designated Care Team:    Richardson Dopp, PA-C  Robbie Lis, [place]    Other Instructions You have been referred to see a pharmacist in our lipid clinic.

## 2020-10-06 NOTE — Telephone Encounter (Signed)
   Primary Cardiologist: Mertie Moores, MD  Chart reviewed as part of pre-operative protocol coverage. Given past medical history and time since last visit, based on ACC/AHA guidelines, JERRET MCBANE would be at acceptable risk for the planned procedure without further cardiovascular testing.   Ok to hold Brilinta 5-7 days pre op if needed.  The patient was advised that if he develops new symptoms prior to surgery to contact our office to arrange for a follow-up visit, and he verbalized understanding.  I will route this recommendation to the requesting party via Epic fax function and remove from pre-op pool.  Please call with questions.  Kerin Ransom, PA-C 10/06/2020, 1:58 PM

## 2020-10-06 NOTE — Progress Notes (Signed)
Cardiology Office Note:    Date:  10/06/2020   ID:  Charles Montoya, DOB 12/01/1970, MRN [id number]  PCP:  Lujean Amel, MD  Cardiologist:  Mertie Moores, MD    Referring MD: Lujean Amel, MD   Problem List 1. CAD - s/p DES to PDA 2. Hyperlipidemia:   Chief Complaint  Patient presents with  . Coronary Artery Disease       Charles Montoya is a 50 y.o. male with a hx of Coronary artery disease. He status post stenting of his posterior descending artery in June, 2018. He has moderate disease and his left anterior descending artery and circumflex proximal artery.  Is exercising 4 times a week. Has stopped smoking  Using nicorette gum .   He was having lots of fatigue - he reduced his atorvastatin to 20 mg QOD.   Fatigue  Is better.     November 08, 2017 Doing well.  Lipid levels look good  Has vague aches and pains.   No angina pain  Exercising regularly  intolerent to statins ,  Is on Repatha Asked questions about insulin resistance  Oct. 2, 2019:  Charles Montoya is seen back for follow up of his CAD.   S/p stenting of his PDA. Was on Repatha - became very fatigued. Stopped the Repatha l several weeks ago.   Is finally feeling better now Tried multiple statins,  All the ones he tried made him ache.  , foggy headed. , fatigued.    April 27, 2019: CV seen today for follow-up of his coronary artery disease.  He status post testing of his right coronary artery. Has a history of hyperlipidemia.  He tried multiple statins all of which make him have muscle aches and feel foggy headed.  He tried Repatha but became very fatigued. Pralulent also caused severe fagigue. VAsepa caused leg weakness.  Is on Brilinta 60 mg bid.  Off ASA  Dec 18, 2019:  Charles Montoya is seen back for his CAD, HLD  Intolerant to multiple statins, repatha and pralulent BP is typically well controlled.  Has some anxiety issues on occasion Is not exercising as much.   Wants to get back into cardiac rehab. Has tried  Zetia but developed severe aches and fatigue ( "cant get out of bed")  We discussed the book  Caldwell B. Esselstyn - "Prevent and Reverse Heart Disease"  We had a long discussion about Nexletol. We discussed QOD dosing of Atorvastatin   He wants to try atorvastatin 20 QOD , Quinol.     October 06, 2020: Charles Montoya is here today for follow-up of his coronary artery disease.  He has had some residual shoulder pain that feels very similar to his angina.  He has been to the emergency room a couple times. He had a stress test.  It shows a previous inferior wall myocardial infarction which is known.  There is no evidence of ischemia.  Not as much exercise recently  Has seen Dr. Debara Pickett.  Has tried Repatha and then felt poorly  He is hesitant to try Inclisiran   He needs to have a colonoscopy  . Needs to hold Brilinta for 5-7 days   Past Medical History:  Diagnosis Date  . Anxiety   . CAD (coronary artery disease)    a. cath 12/2016--> s/p DES to RPDA, 50% dRCA, 25% pLAD, 40% mLAD, 25% pro to mid CX  . Tobacco abuse     Past Surgical History:  Procedure Laterality Date  . CARDIAC  CATHETERIZATION    . CORONARY STENT INTERVENTION N/A 01/19/2017   Procedure: Coronary Stent Intervention;  Surgeon: Jettie Booze, MD;  Location: [facility];  Service: Cardiovascular;  Laterality: N/A;  . HERNIA REPAIR    . LEFT HEART CATH AND CORONARY ANGIOGRAPHY N/A 01/19/2017   Procedure: Left Heart Cath and Coronary Angiography;  Surgeon: Jettie Booze, MD;  Location: [facility];  Service: Cardiovascular;  Laterality: N/A;    Current Medications: Current Meds  Medication Sig  . ALPRAZolam (XANAX) 0.5 MG tablet Take 0.25-0.5 mg by mouth 3 (three) times daily as needed for anxiety or sleep. Anxiety  . Bempedoic Acid (NEXLETOL) 180 MG TABS Take 1 tablet by mouth daily.  Marland Kitchen BRILINTA 60 MG TABS tablet TAKE 1 TABLET BY MOUTH TWICE DAILY.  . calcium carbonate (TUMS - DOSED IN MG  ELEMENTAL CALCIUM) 500 MG chewable tablet Chew 2 tablets by mouth 4 (four) times daily as needed for indigestion or heartburn.  . clomiPRAMINE (ANAFRANIL) 25 MG capsule Take 25 mg by mouth at bedtime.  . cyclobenzaprine (FLEXERIL) 10 MG tablet Take 1 tablet (10 mg total) by mouth 2 (two) times daily as needed for muscle spasms.  . nicotine polacrilex (NICORETTE) 4 MG gum Take 4 mg by mouth as needed for smoking cessation.  . nitroGLYCERIN (NITROSTAT) 0.4 MG SL tablet 1 TAB UNDER TONGUE AS NEEDED FOR CHEST PAIN. MAY REPEAT EVERY 5 MIN FOR A TOTAL OF 3 DOSES.  Marland Kitchen tiZANidine (ZANAFLEX) 4 MG tablet Take 4 mg by mouth 3 (three) times daily.     Allergies:   Codeine, Lactose intolerance (gi), Other, Praluent [alirocumab], Repatha [evolocumab], and Statins   Social History   Socioeconomic History  . Marital status: Single    Spouse name: Not on file  . Number of children: Not on file  . Years of education: Not on file  . Highest education level: Not on file  Occupational History  . Not on file  Tobacco Use  . Smoking status: Former Smoker    Packs/day: 1.50    Types: Cigarettes    Quit date: 01/19/2017    Years since quitting: 3.7  . Smokeless tobacco: Never Used  . Tobacco comment: Using nicoderm gum  Vaping Use  . Vaping Use: Not on file  Substance and Sexual Activity  . Alcohol use: No  . Drug use: No  . Sexual activity: Not on file  Other Topics Concern  . Not on file  Social History Narrative  . Not on file   Social Determinants of Health   Financial Resource Strain: Not on file  Food Insecurity: Not on file  Transportation Needs: Not on file  Physical Activity: Not on file  Stress: Not on file  Social Connections: Not on file     Family History: The patient's family history includes CAD in his father; Hypertension in his mother. ROS:   Please see the history of present illness.     All other systems reviewed and are negative.  EKGs/Labs/Other Studies Reviewed:      Physical Exam: Blood pressure 112/80, pulse 60, height 5' 10.5" (1.791 m), weight 191 lb (86.6 kg), SpO2 98 %.  GEN:  Well nourished, well developed in no acute distress HEENT: Normal NECK: No JVD; No carotid bruits LYMPHATICS: No lymphadenopathy CARDIAC: RRR , no murmurs, rubs, gallops RESPIRATORY:  Clear to auscultation without rales, wheezing or rhonchi  ABDOMEN: Soft, non-tender, non-distended MUSCULOSKELETAL:  No edema; No deformity  SKIN: Warm  and dry NEUROLOGIC:  Alert and oriented x 3   EKG:      October 06 2020:  , NSR at 60 ,  Recent Labs: 10/29/2019: TSH 2.030 03/22/2020: Hemoglobin 13.8; Platelets 240 06/09/2020: ALT 20; BUN 16; Creatinine, Ser 1.26; Potassium 4.2; Sodium 141  Recent Lipid Panel    Component Value Date/Time   CHOL 282 (H) 06/09/2020 1104   TRIG 182 (H) 06/09/2020 1104   TRIG 163 (H) 06/07/2017 0918   HDL 36 (L) 06/09/2020 1104   HDL 34 (L) 06/07/2017 0918   CHOLHDL 7.8 (H) 06/09/2020 1104   CHOLHDL 6.6 01/19/2017 0845   VLDL 48 (H) 01/19/2017 0845   LDLCALC 211 (H) 06/09/2020 1104      ASSESSMENT:    1. Mixed hyperlipidemia   2. Coronary artery disease involving native coronary artery of native heart without angina pectoris    PLAN:      CAD -    status post stenting of his posterior descending artery using a 2.5 x 16 mm Promus drug-eluting stent. He needs to have a colonoscipy He is at low risk for CV event He may hold his Brilinta for 5-7 days prior to colonoscipy Restart as soon as possible from a GI standpoint .   2. Hyperlipidema:   He would like to explore other  Options with our pharmacy lipid clinic .   Will place a referral .    Medication Adjustments/Labs and Tests Ordered: Current medicines are reviewed at length with the patient today.  Concerns regarding medicines are outlined above.  Orders Placed This Encounter  Procedures  . AMB Referral to [facility]  . EKG 12-Lead   No orders of the defined types  were placed in this encounter.    Signed, Mertie Moores, MD  10/06/2020 9:29 AM    [facility]

## 2020-10-06 NOTE — Telephone Encounter (Signed)
   [facility] Pre-operative Risk Assessment    Request for surgical clearance:  1. What type of surgery is being performed? Colon Cancer Screening   2. When is this surgery scheduled? 11/06/20   3. Are there any medications that need to be held prior to surgery and how long?Brilinta x 5 days   4. Practice name and name of physician performing surgery? [facility] - Dr Cristina Gong   5. What is your office phone and fax number? [phone] F#[phone]   6. Anesthesia type (None, local, MAC, general) ? Not listed   Arcadia Gorgas 10/06/2020, 12:03 PM  _________________________________________________________________   (provider comments below)

## 2020-10-27 ENCOUNTER — Ambulatory Visit: Payer: BC Managed Care – PPO

## 2020-11-05 ENCOUNTER — Ambulatory Visit: Payer: BC Managed Care – PPO | Admitting: Dermatology

## 2020-11-05 ENCOUNTER — Other Ambulatory Visit: Payer: Self-pay

## 2020-11-05 ENCOUNTER — Encounter: Payer: Self-pay | Admitting: Dermatology

## 2020-11-05 DIAGNOSIS — D1801 Hemangioma of skin and subcutaneous tissue: Secondary | ICD-10-CM

## 2020-11-05 DIAGNOSIS — L821 Other seborrheic keratosis: Secondary | ICD-10-CM

## 2020-11-05 DIAGNOSIS — L570 Actinic keratosis: Secondary | ICD-10-CM | POA: Diagnosis not present

## 2020-11-05 DIAGNOSIS — Z1283 Encounter for screening for malignant neoplasm of skin: Secondary | ICD-10-CM | POA: Diagnosis not present

## 2020-11-06 DIAGNOSIS — Z1211 Encounter for screening for malignant neoplasm of colon: Secondary | ICD-10-CM | POA: Diagnosis not present

## 2020-11-06 DIAGNOSIS — K621 Rectal polyp: Secondary | ICD-10-CM | POA: Diagnosis not present

## 2020-11-15 ENCOUNTER — Encounter: Payer: Self-pay | Admitting: Dermatology

## 2020-11-15 NOTE — Progress Notes (Signed)
   New Patient   Subjective  Charles Montoya is a 50 y.o. male who presents for the following: Annual Exam (Here for full body skin examination- no new concerns).  General skin examination Location:  Duration:  Quality:  Associated Signs/Symptoms: Modifying Factors:  Severity:  Timing: Context:    The following portions of the chart were reviewed this encounter and updated as appropriate:  Tobacco  Allergies  Meds  Problems  Med Hx  Surg Hx  Fam Hx      Objective  Well appearing patient in no apparent distress; mood and affect are within normal limits. Objective  Full body: Full body skin check. No atypical moles.  Objective  Mid Back: Red 1 to 2 mm smooth papules.   Objective  Right Forehead: Gritty 5 mm pink crust  Objective  Left Forearm - Anterior: Tan textured 4 mm slightly raised flattopped papule    A full examination was performed including scalp, head, eyes, ears, nose, lips, neck, chest, axillae, abdomen, back, buttocks, bilateral upper extremities, bilateral lower extremities, hands, feet, fingers, toes, fingernails, and toenails. All findings within normal limits unless otherwise noted below.   Assessment & Plan  Screening exam for skin cancer Full body  Yearly skin check.  Encouraged to self examine twice annually.  Continued ultraviolet protection.  Cherry angioma Mid Back  Intervention not necessary  AK (actinic keratosis) Right Forehead  Destruction of lesion - Right Forehead Complexity: simple   Destruction method: cryotherapy   Informed consent: discussed and consent obtained   Timeout:  patient name, date of birth, surgical site, and procedure verified Lesion destroyed using liquid nitrogen: Yes   Cryotherapy cycles:  5 Outcome: patient tolerated procedure well with no complications   Post-procedure details: wound care instructions given    Seborrheic keratosis Left Forearm - Anterior  Leave if stable

## 2020-11-19 DIAGNOSIS — F4010 Social phobia, unspecified: Secondary | ICD-10-CM | POA: Diagnosis not present

## 2020-11-24 ENCOUNTER — Ambulatory Visit: Payer: BC Managed Care – PPO

## 2020-11-24 NOTE — Progress Notes (Deleted)
Patient ID: Charles Montoya                 DOB: 1970/09/17                    MRN: [id number]     HPI: Charles Montoya is a 50 y.o. male patient referred to lipid clinic by Charles Montoya. PMH is significant for CAD s/p stenting in 2018 and HLD.  Current Medications: nexletol 180mg  daily Intolerances: atorvastatin (achey, fatigue, foggy headed), Repatha (fatigue), Praluent (fatigue), Vascepa (leg weakness), zetia (severe aches and fatigue), simvastatin (myalgias) Risk Factors: premature ASCVD LDL goal: <55  Diet:   Exercise:   Family History:   Social History:   Labs:08/27/20 TC 249 TG 197 HDL 34  LDL 177 (Nexlizet 180mg )         06/09/20 TC 282,TG 182, HDL 36, LDL 211  Past Medical History:  Diagnosis Date  . Anxiety   . CAD (coronary artery disease)    a. cath 12/2016--> s/p DES to RPDA, 50% dRCA, 25% pLAD, 40% mLAD, 25% pro to mid CX  . Tobacco abuse     Current Outpatient Medications on File Prior to Visit  Medication Sig Dispense Refill  . ALPRAZolam (XANAX) 0.5 MG tablet Take 0.25-0.5 mg by mouth 3 (three) times daily as needed for anxiety or sleep. Anxiety    . Bempedoic Acid (NEXLETOL) 180 MG TABS Take 1 tablet by mouth daily. 90 tablet 3  . BRILINTA 60 MG TABS tablet TAKE 1 TABLET BY MOUTH TWICE DAILY. 60 tablet 7  . calcium carbonate (TUMS - DOSED IN MG ELEMENTAL CALCIUM) 500 MG chewable tablet Chew 2 tablets by mouth 4 (four) times daily as needed for indigestion or heartburn.    . clomiPRAMINE (ANAFRANIL) 25 MG capsule Take 25 mg by mouth at bedtime.    . cyclobenzaprine (FLEXERIL) 10 MG tablet Take 1 tablet (10 mg total) by mouth 2 (two) times daily as needed for muscle spasms. 20 tablet 0  . nicotine polacrilex (NICORETTE) 4 MG gum Take 4 mg by mouth as needed for smoking cessation.    . nitroGLYCERIN (NITROSTAT) 0.4 MG SL tablet 1 TAB UNDER TONGUE AS NEEDED FOR CHEST PAIN. MAY REPEAT EVERY 5 MIN FOR A TOTAL OF 3 DOSES. 25 tablet 3  . tiZANidine (ZANAFLEX) 4 MG tablet Take 4 mg  by mouth 3 (three) times daily.     No current facility-administered medications on file prior to visit.    Allergies  Allergen Reactions  . Codeine Itching  . Lactose Intolerance (Gi) Other (See Comments)    Abdominal pain   . Other Itching    MSG food allergy   . Praluent [Alirocumab] Other (See Comments)    Fatigue, weakness, back pain, depression   . Repatha [Evolocumab] Other (See Comments)    Fatigue, weakness, back pain, depression   . Statins Other (See Comments)    Atorvastatin, Rosuvastatin, Simvastatin - generalized fatigue, weakness,  Muscle pain    Assessment/Plan:  1. Hyperlipidemia -    Thank you,   Ramond Dial, Pharm.D, BCPS, CPP [place]  [address] [place],  [address]  Phone: [phone]; Fax: [phone]

## 2020-12-01 ENCOUNTER — Ambulatory Visit: Payer: BC Managed Care – PPO | Admitting: Internal Medicine

## 2020-12-16 ENCOUNTER — Other Ambulatory Visit: Payer: Self-pay

## 2020-12-16 ENCOUNTER — Ambulatory Visit: Payer: BC Managed Care – PPO | Admitting: Pharmacist

## 2020-12-16 DIAGNOSIS — T466X5A Adverse effect of antihyperlipidemic and antiarteriosclerotic drugs, initial encounter: Secondary | ICD-10-CM

## 2020-12-16 DIAGNOSIS — G720 Drug-induced myopathy: Secondary | ICD-10-CM | POA: Diagnosis not present

## 2020-12-16 DIAGNOSIS — E782 Mixed hyperlipidemia: Secondary | ICD-10-CM | POA: Diagnosis not present

## 2020-12-16 MED ORDER — EZETIMIBE 10 MG PO TABS: 10.0000 mg | ORAL_TABLET | Freq: Every day | ORAL | 11 refills | Status: DC

## 2020-12-16 NOTE — Patient Instructions (Addendum)
Your LDL cholesterol is 211 and your goal is < 55  Continue taking ezetimibe (Zetia) 10mg  -1 tablet once daily  In another week, add on Nexletol 180mg  - 1 tablet once daily  Call Jinny Blossom, PharmD with any tolerability issues at [phone]  If you tolerate both medications well, we would change you to the combo pill Nexlizet and recheck your cholesterol 6-8 weeks later  Aim for 150 minutes of exercise each week and continue to eat a heart healthy diet

## 2020-12-16 NOTE — Progress Notes (Signed)
Patient ID: JAEDYN LARD                 DOB: 1971-04-17                    MRN: [id number]     HPI: Charles Montoya is a 50 y.o. male patient referred to lipid clinic by Dr Acie Fredrickson. PMH is significant for CAD s/p stenting of PDA in 2018, former tobacco abuse (quit in 2018 after stent placed), and anxiety. He has followed in the past in lipid clinic and has a multitude of drug intolerances.  Pt presents today in good spirits. He actually restarted ezetimibe about 3 weeks ago using an old medication supply. Thinks when he previously had trouble tolerating this med, it's because he was still dealing with side effects from other medications. He has tried 3 statins including rosuvastatin, atorvastatin, and simvastatin, all of which caused myalgias, fatigue, and brain fog within a few days of therapy initiation. He does not wish to rechallenge with another statin. Repatha and Praluent caused similar symptoms, but were worse and took 10-12 months to present. Praluent also caused him to feel depressed. Vascepa caused leg weakness particularly in the joints of his knees that affected his ability to walk. Reports his diet wasn't as great when his labs were checked last fall due to [place]. He never started Nexletol samples that he was given at Nov 2022 visit with Dr Debara Pickett.  Current Medications: ezetimibe 10mg  daily  Intolerances:  Atorvastatin 5-80mg  daily, rosuvastatin 10mg  daily and 3x/week, simvastatin - myalgias, fatigue, brain fog/forgetfulness - side effects occurred within a few days Ezetimibe - aches and fatigue Repatha, Praluent - fatigue, lethargy, back pain, forgetfulness (worse than statins but took 10-12 months to present) Vascepa - leg weakness in his knee joints, trouble walking (sx within 2-3 weeks)  Risk Factors: premature CAD, fam hx of premature CAD, former tobacco abuse  LDL goal: < 55mg /dL   Diet: Watches his carbs, reports clean eating  Exercise: Walking 30-45 minutes 3x per week. Eats  out a bit - likes grilled chicken, kebabs, [employer]ipot[employer], [employer], [employer] prepped meals, salads.  Family History: Father had heart surgery in his 65s. Paternal grandfather with CAD.   Social History: Former smoker 1.5 PPD, quit in 2018, denies alcohol and drug use  Labs: 06/09/20: TC 282, TG 182, HDL 36, LDL 211 (no lipid lowering therapy) 11/10/17: TC 148, HDL 46, TG 155, LDL 71, LDL-P 898, Lp(a) 7, ApoB 71, LP-IR 64 (on Repatha 140mg  Q2W) 08/31/17: TC 229, HDL 38, TG 127, LDL 166, LDL-P 2294, ApoB 141, CRP < 0.3 (no lipid lowering therapy)  Past Medical History:  Diagnosis Date  . Anxiety   . CAD (coronary artery disease)    a. cath 12/2016--> s/p DES to RPDA, 50% dRCA, 25% pLAD, 40% mLAD, 25% pro to mid CX  . Tobacco abuse     Current Outpatient Medications on File Prior to Visit  Medication Sig Dispense Refill  . ALPRAZolam (XANAX) 0.5 MG tablet Take 0.25-0.5 mg by mouth 3 (three) times daily as needed for anxiety or sleep. Anxiety    . Bempedoic Acid (NEXLETOL) 180 MG TABS Take 1 tablet by mouth daily. 90 tablet 3  . BRILINTA 60 MG TABS tablet TAKE 1 TABLET BY MOUTH TWICE DAILY. 60 tablet 7  . calcium carbonate (TUMS - DOSED IN MG ELEMENTAL CALCIUM) 500 MG chewable tablet Chew 2 tablets by mouth 4 (four) times daily as needed for  indigestion or heartburn.    . clomiPRAMINE (ANAFRANIL) 25 MG capsule Take 25 mg by mouth at bedtime.    . cyclobenzaprine (FLEXERIL) 10 MG tablet Take 1 tablet (10 mg total) by mouth 2 (two) times daily as needed for muscle spasms. 20 tablet 0  . nicotine polacrilex (NICORETTE) 4 MG gum Take 4 mg by mouth as needed for smoking cessation.    . nitroGLYCERIN (NITROSTAT) 0.4 MG SL tablet 1 TAB UNDER TONGUE AS NEEDED FOR CHEST PAIN. MAY REPEAT EVERY 5 MIN FOR A TOTAL OF 3 DOSES. 25 tablet 3  . tiZANidine (ZANAFLEX) 4 MG tablet Take 4 mg by mouth 3 (three) times daily.     No current facility-administered medications on file prior to visit.    Allergies   Allergen Reactions  . Codeine Itching  . Lactose Intolerance (Gi) Other (See Comments)    Abdominal pain   . Other Itching    MSG food allergy   . Praluent [Alirocumab] Other (See Comments)    Fatigue, weakness, back pain, depression   . Repatha [Evolocumab] Other (See Comments)    Fatigue, weakness, back pain, depression   . Statins Other (See Comments)    Atorvastatin, Rosuvastatin, Simvastatin - generalized fatigue, weakness,  Muscle pain    Assessment/Plan:  1. Hyperlipidemia - Baseline LDL has ranged from 166 to 211, above goal < 55 given hx of premature CAD. He is intolerant to 3 statins, both PCSK9i, and Vascepa. He has been tolerating rechallenge of ezetimibe 10mg  daily well so far for the last 3 weeks. In another week, advised pt to add on Nexletol 180mg /day samples that he was given last fall. If pt tolerates both meds well, will change him to combo Nexlizet pill and schedule lab follow up ~8 weeks later. Pt aware to call clinic with any side effects. Encouraged him to aim for 150 minutes of exercise and continue with heart-healthy diet. Also discussed Leqvio injections, he is still hesitant given infrequent dosing schedule and potential for side effects given his prior intolerances. Provided him with clinical info today as well as ORION-10 study. Can revisit Leqvio in the future pending tolerability of other meds and updated lipid panel.  Annelie Boak E. Harmony Sandell, PharmD, BCACP, [place] [address] [place], [place] [address] Phone: [phone]; Fax: [phone] 12/16/2020 2:19 PM

## 2021-01-20 ENCOUNTER — Telehealth: Payer: Self-pay | Admitting: Pharmacist

## 2021-01-20 NOTE — Telephone Encounter (Signed)
Called pt and left message to follow up with tolerability of Nexletol samples that he started on last month. If tolerating well, will change rx to Nexlizet since pt has been taking ezetimibe and tolerating well, and recheck lipids in 1 month.  If not tolerating well, could consider statin rechallenge with low dose pravastatin 10mg  daily if pt is willing. He is previously intolerant to:  - Atorvastatin 5-80mg  daily -Rosuvastatin 10mg  daily and 3x/week -Simvastatin - myalgias, fatigue, brain fog/forgetfulness - side effects occurred within a few days - Ezetimibe - aches and fatigue - Repatha, Praluent - fatigue, lethargy, back pain, forgetfulness (worse than statins but took 10-12 months to present) - Vascepa - leg weakness in his knee joints, trouble walking (sx within 2-3 weeks)

## 2021-01-21 NOTE — Telephone Encounter (Signed)
Called pt again. States he has not started Nexletol yet because he is still getting used to ezetimibe. Reports some fatigue in the morning that he attributes to his ezetimibe. Takes the med after lunch. Advised him to move med to PM dosing to see if this helps. He states he plans to start Nexletol when he gets back from vacation in 2 weeks. Will call pt in a month for an update.

## 2021-02-03 ENCOUNTER — Other Ambulatory Visit: Payer: Self-pay | Admitting: Cardiovascular Disease

## 2021-02-17 DIAGNOSIS — M9904 Segmental and somatic dysfunction of sacral region: Secondary | ICD-10-CM | POA: Diagnosis not present

## 2021-02-17 DIAGNOSIS — M545 Low back pain, unspecified: Secondary | ICD-10-CM | POA: Diagnosis not present

## 2021-02-17 DIAGNOSIS — M9905 Segmental and somatic dysfunction of pelvic region: Secondary | ICD-10-CM | POA: Diagnosis not present

## 2021-02-17 DIAGNOSIS — M9903 Segmental and somatic dysfunction of lumbar region: Secondary | ICD-10-CM | POA: Diagnosis not present

## 2021-02-18 ENCOUNTER — Telehealth: Payer: Self-pay | Admitting: Pharmacist

## 2021-02-18 NOTE — Telephone Encounter (Signed)
Left message for pt to see if he started Nexletol yet. If he's started it and is tolerating Nexletol and ezetimibe well, will change to combo Nexlizet tablet (may need new prior auth).  If not tolerating well, could consider statin rechallenge with low dose pravastatin '10mg'$  daily if pt is willing. He is previously intolerant to: - Atorvastatin 5-'80mg'$  daily -Rosuvastatin '10mg'$  daily and 3x/week -Simvastatin - myalgias, fatigue, brain fog/forgetfulness - side effects occurred within a few days - Ezetimibe - aches and fatigue - Repatha, Praluent - fatigue, lethargy, back pain, forgetfulness (worse than statins but took 10-12 months to present) - Vascepa - leg weakness in his knee joints, trouble walking (sx within 2-3 weeks)

## 2021-02-18 NOTE — Telephone Encounter (Signed)
Pt returned call, states he just got back from the beach and hasn't started Nexletol yet. Plans to start August 1. Will call pt in Sept for tolerability update.

## 2021-03-02 ENCOUNTER — Telehealth: Payer: Self-pay | Admitting: Pharmacist

## 2021-03-02 NOTE — Telephone Encounter (Signed)
Pt called clinic, reports weakness in his right arm over the past few months after starting ezetimibe. When he goes to grab a cup, sometimes will need to use both hands. Left arm feels fine. Discussed that myalgias due to LLT are typically bilateral. To rule out ezetimibe as a cause, advised pt to hold ezetimibe for 1-2 weeks and monitor sx, then rechallenge with therapy. He is tolerating Nexletol well which he's been taking for the past week. Advised pt to call in a few weeks with update.

## 2021-03-25 ENCOUNTER — Telehealth: Payer: Self-pay | Admitting: Pharmacist

## 2021-03-25 NOTE — Telephone Encounter (Signed)
Called pt to follow up with tolerability of lipid meds and left message.   He was advised 8/8 phone call to hold ezetimibe for 1-2 weeks to see if weakness in his arm improved/resolved. He continues on Nexletol which he has reported tolerating well, but has a multitude of intolerances to other lipid lowering meds (atorvastatin 5-'80mg'$  daily, rosuvastatin '10mg'$  daily and 3x per week, simvastatin, Repatha, Praluent, and Vascepa), doesn't qualify for ORION-4 trial unfortunately and inclisiran coverage with commercial plans has not been great.

## 2021-05-14 DIAGNOSIS — F4010 Social phobia, unspecified: Secondary | ICD-10-CM | POA: Diagnosis not present

## 2021-05-14 DIAGNOSIS — F410 Panic disorder [episodic paroxysmal anxiety] without agoraphobia: Secondary | ICD-10-CM | POA: Diagnosis not present

## 2021-09-08 DIAGNOSIS — I2510 Atherosclerotic heart disease of native coronary artery without angina pectoris: Secondary | ICD-10-CM | POA: Diagnosis not present

## 2021-09-08 DIAGNOSIS — Z131 Encounter for screening for diabetes mellitus: Secondary | ICD-10-CM | POA: Diagnosis not present

## 2021-09-08 DIAGNOSIS — Z125 Encounter for screening for malignant neoplasm of prostate: Secondary | ICD-10-CM | POA: Diagnosis not present

## 2021-09-08 DIAGNOSIS — E7800 Pure hypercholesterolemia, unspecified: Secondary | ICD-10-CM | POA: Diagnosis not present

## 2021-09-08 DIAGNOSIS — Z0000 Encounter for general adult medical examination without abnormal findings: Secondary | ICD-10-CM | POA: Diagnosis not present

## 2021-10-26 ENCOUNTER — Other Ambulatory Visit: Payer: Self-pay | Admitting: Cardiovascular Disease

## 2021-11-09 ENCOUNTER — Ambulatory Visit: Payer: BC Managed Care – PPO | Admitting: Dermatology

## 2021-11-13 DIAGNOSIS — F410 Panic disorder [episodic paroxysmal anxiety] without agoraphobia: Secondary | ICD-10-CM | POA: Diagnosis not present

## 2021-11-13 DIAGNOSIS — F4010 Social phobia, unspecified: Secondary | ICD-10-CM | POA: Diagnosis not present

## 2021-11-18 DIAGNOSIS — M9902 Segmental and somatic dysfunction of thoracic region: Secondary | ICD-10-CM | POA: Diagnosis not present

## 2021-11-18 DIAGNOSIS — M62838 Other muscle spasm: Secondary | ICD-10-CM | POA: Diagnosis not present

## 2021-11-18 DIAGNOSIS — M546 Pain in thoracic spine: Secondary | ICD-10-CM | POA: Diagnosis not present

## 2021-11-23 ENCOUNTER — Encounter: Payer: Self-pay | Admitting: Cardiovascular Disease

## 2021-11-23 ENCOUNTER — Telehealth: Payer: Self-pay | Admitting: Pharmacist

## 2021-11-23 ENCOUNTER — Ambulatory Visit: Payer: BC Managed Care – PPO | Admitting: Cardiovascular Disease

## 2021-11-23 VITALS — BP 134/88 | HR 78 | Ht 70.5 in | Wt 190.2 lb

## 2021-11-23 DIAGNOSIS — E782 Mixed hyperlipidemia: Secondary | ICD-10-CM

## 2021-11-23 MED ORDER — NEXLETOL 180 MG PO TABS: 1.0000 | ORAL_TABLET | Freq: Every day | ORAL | 3 refills | Status: DC

## 2021-11-23 NOTE — Progress Notes (Signed)
Cardiology Office Note:    Date:  11/23/2021   ID:  Charles Montoya, DOB 23-Oct-1970, MRN [id number]  PCP:  Darrow Bussing, MD  Cardiologist:  Kristeen Miss, MD    Referring MD: Darrow Bussing, MD   Problem List 1. CAD - s/p DES to PDA 2. Hyperlipidemia:   Chief Complaint  Patient presents with   Coronary Artery Disease        Hyperlipidemia       Charles Montoya is a 51 y.o. male with a hx of Coronary artery disease. He status post stenting of his posterior descending artery in June, 2018. He has moderate disease and his left anterior descending artery and circumflex proximal artery.  Is exercising 4 times a week. Has stopped smoking  Using nicorette gum .   He was having lots of fatigue - he reduced his atorvastatin to 20 mg QOD.   Fatigue  Is better.     November 08, 2017 Doing well.  Lipid levels look good  Has vague aches and pains.   No angina pain  Exercising regularly  intolerent to statins ,  Is on Repatha Asked questions about insulin resistance  Oct. 2, 2019:  Charles Montoya is seen back for follow up of his CAD.   S/p stenting of his PDA. Was on Repatha - became very fatigued. Stopped the Repatha l several weeks ago.   Is finally feeling better now Tried multiple statins,  All the ones he tried made him ache.  , foggy headed. , fatigued.    April 27, 2019: CV seen today for follow-up of his coronary artery disease.  He status post testing of his right coronary artery. Has a history of hyperlipidemia.  He tried multiple statins all of which make him have muscle aches and feel foggy headed.  He tried Repatha but became very fatigued. Pralulent also caused severe fagigue. VAsepa caused leg weakness.  Is on Brilinta 60 mg bid.  Off ASA  Dec 18, 2019:  Charles Montoya is seen back for his CAD, HLD  Intolerant to multiple statins, repatha and pralulent BP is typically well controlled.  Has some anxiety issues on occasion Is not exercising as much.   Wants to get back into  cardiac rehab. Has tried Zetia but developed severe aches and fatigue ( "cant get out of bed")  We discussed the book  Caldwell B. Esselstyn - "Prevent and Reverse Heart Disease"  We had a long discussion about Nexletol. We discussed QOD dosing of Atorvastatin   He wants to try atorvastatin 20 QOD , Quinol.     October 06, 2020: Charles Montoya is here today for follow-up of his coronary artery disease.  He has had some residual shoulder pain that feels very similar to his angina.  He has been to the emergency room a couple times. He had a stress test.  It shows a previous inferior wall myocardial infarction which is known.  There is no evidence of ischemia.  Not as much exercise recently  Has seen Dr. Rennis Golden.  Has tried Repatha and then felt poorly  He is hesitant to try Inclisiran   He needs to have a colonoscopy  . Needs to hold Brilinta for 5-7 days   Nov 23, 2021  Doing well On Brilinta 60 BID  No CP , no dyspnea.  Has seen [facility] for his HLD.  Is on Zetia Has tried repatha, pralulent Wants to add bempidoic acid   Past Medical History:  Diagnosis Date   Anxiety  CAD (coronary artery disease)    a. cath 12/2016--> s/p DES to RPDA, 50% dRCA, 25% pLAD, 40% mLAD, 25% pro to mid CX   Tobacco abuse     Past Surgical History:  Procedure Laterality Date   CARDIAC CATHETERIZATION     CORONARY STENT INTERVENTION N/A 01/19/2017   Procedure: Coronary Stent Intervention;  Surgeon: Corky Crafts, MD;  Location: [facility] INVASIVE CV LAB;  Service: Cardiovascular;  Laterality: N/A;   HERNIA REPAIR     LEFT HEART CATH AND CORONARY ANGIOGRAPHY N/A 01/19/2017   Procedure: Left Heart Cath and Coronary Angiography;  Surgeon: Corky Crafts, MD;  Location: [facility] INVASIVE CV LAB;  Service: Cardiovascular;  Laterality: N/A;    Current Medications: Current Meds  Medication Sig   ALPRAZolam (XANAX) 0.5 MG tablet Take 0.25-0.5 mg by mouth 3 (three) times daily as needed for anxiety or sleep.  Anxiety   BRILINTA 60 MG TABS tablet TAKE 1 TABLET BY MOUTH TWICE DAILY.   calcium carbonate (TUMS - DOSED IN MG ELEMENTAL CALCIUM) 500 MG chewable tablet Chew 2 tablets by mouth 4 (four) times daily as needed for indigestion or heartburn.   clomiPRAMINE (ANAFRANIL) 25 MG capsule Take 25 mg by mouth at bedtime.   clotrimazole-betamethasone (LOTRISONE) cream Apply topically 2 (two) times daily as needed. Apply topically 2 (two) times daily as needed.   ezetimibe (ZETIA) 10 MG tablet Take 1 tablet (10 mg total) by mouth daily.   nicotine polacrilex (NICORETTE) 4 MG gum Take 4 mg by mouth as needed for smoking cessation.   nitroGLYCERIN (NITROSTAT) 0.4 MG SL tablet 1 TAB UNDER TONGUE AS NEEDED FOR CHEST PAIN. MAY REPEAT EVERY 5 MIN FOR A TOTAL OF 3 DOSES.   sildenafil (REVATIO) 20 MG tablet Take 2-5 tablets by mouth. 30 minutes prior to sexual intercourse   tiZANidine (ZANAFLEX) 4 MG tablet Take 4 mg by mouth 3 (three) times daily.   [DISCONTINUED] Bempedoic Acid (NEXLETOL) 180 MG TABS Take 1 tablet by mouth daily.     Allergies:   Codeine, Lactose intolerance (gi), Other, Praluent [alirocumab], Repatha [evolocumab], and Statins   Social History   Socioeconomic History   Marital status: Single    Spouse name: Not on file   Number of children: Not on file   Years of education: Not on file   Highest education level: Not on file  Occupational History   Not on file  Tobacco Use   Smoking status: Former    Packs/day: 1.50    Types: Cigarettes    Quit date: 01/19/2017    Years since quitting: 4.8   Smokeless tobacco: Never   Tobacco comments:    Using nicoderm gum  Vaping Use   Vaping Use: Not on file  Substance and Sexual Activity   Alcohol use: No   Drug use: No   Sexual activity: Not on file  Other Topics Concern   Not on file  Social History Narrative   Not on file   Social Determinants of Health   Financial Resource Strain: Not on file  Food Insecurity: Not on file   Transportation Needs: Not on file  Physical Activity: Not on file  Stress: Not on file  Social Connections: Not on file     Family History: The patient's family history includes CAD in his father; Hypertension in his mother. ROS:   Please see the history of present illness.     All other systems reviewed and are negative.  EKGs/Labs/Other Studies Reviewed:  Physical Exam: Blood pressure 134/88, pulse 78, height 5' 10.5" (1.791 m), weight 190 lb 3.2 oz (86.3 kg), SpO2 99 %.  GEN:  Well nourished, well developed in no acute distress HEENT: Normal NECK: No JVD; No carotid bruits LYMPHATICS: No lymphadenopathy CARDIAC: RRR , no murmurs, rubs, gallops RESPIRATORY:  Clear to auscultation without rales, wheezing or rhonchi  ABDOMEN: Soft, non-tender, non-distended MUSCULOSKELETAL:  No edema; No deformity  SKIN: Warm and dry NEUROLOGIC:  Alert and oriented x 3   EKG:      Nov 23, 2021: Normal sinus rhythm at 78.  Old inferior wall myocardial infarction.  Otherwise normal EKG.  Recent Labs: No results found for requested labs within last 8760 hours.  Recent Lipid Panel    Component Value Date/Time   CHOL 282 (H) 06/09/2020 1104   TRIG 182 (H) 06/09/2020 1104   TRIG 163 (H) 06/07/2017 0918   HDL 36 (L) 06/09/2020 1104   HDL 34 (L) 06/07/2017 0918   CHOLHDL 7.8 (H) 06/09/2020 1104   CHOLHDL 6.6 01/19/2017 0845   VLDL 48 (H) 01/19/2017 0845   LDLCALC 211 (H) 06/09/2020 1104      ASSESSMENT:    1. Mixed hyperlipidemia     PLAN:      CAD -    status post stenting of his posterior descending artery using a 2.5 x 16 mm Promus drug-eluting stent. He needs to have a colonoscipy   2. Hyperlipidema: Lipids have been very challenging to control.  He is on Zetia but his LDL is still over 130.  We will add Bempedoic acid to see if that helps.  He is intolerant to Repatha, Praluent statins.     Medication Adjustments/Labs and Tests Ordered: Current medicines are  reviewed at length with the patient today.  Concerns regarding medicines are outlined above.  Orders Placed This Encounter  Procedures   EKG 12-Lead   Meds ordered this encounter  Medications   Bempedoic Acid (NEXLETOL) 180 MG TABS    Sig: Take 1 tablet by mouth daily.    Dispense:  90 tablet    Refill:  3     Signed, Kristeen Miss, MD  11/23/2021 3:44 PM    [facility]

## 2021-11-23 NOTE — Telephone Encounter (Signed)
Nexletol PA approved. ?

## 2021-11-23 NOTE — Telephone Encounter (Signed)
?  Called and provided copay card information for the nexletol and they stated that the cost would be $10 ?

## 2021-11-23 NOTE — Patient Instructions (Signed)
Medication Instructions:  ?Your physician has recommended you make the following change in your medication:  ? ?RESTART: Nexletol '180mg'$  daily ? ?*If you need a refill on your cardiac medications before your next appointment, please call your pharmacy* ? ? ?Lab Work: ?None ?If you have labs (blood work) drawn today and your tests are completely normal, you will receive your results only by: ?MyChart Message (if you have MyChart) OR ?A paper copy in the mail ?If you have any lab test that is abnormal or we need to change your treatment, we will call you to review the results. ? ? ?Follow-Up: ?At [facility], you and your health needs are our priority.  As part of our continuing mission to provide you with exceptional heart care, we have created designated Provider Care Teams.  These Care Teams include your primary Cardiologist (physician) and Advanced Practice Providers (APPs -  Physician Assistants and Nurse Practitioners) who all work together to provide you with the care you need, when you need it. ? ? ?Your next appointment:   ?1 year(s) ? ?The format for your next appointment:   ?In Person ? ?Provider:   ?Mertie Moores, MD   ? ?  ?

## 2021-12-24 ENCOUNTER — Encounter: Payer: Self-pay | Admitting: Cardiovascular Disease

## 2021-12-24 ENCOUNTER — Other Ambulatory Visit: Payer: Self-pay | Admitting: Interventional Cardiology

## 2021-12-24 MED ORDER — NITROGLYCERIN 0.4 MG SL SUBL: SUBLINGUAL_TABLET | SUBLINGUAL | 11 refills | Status: AC

## 2022-01-22 ENCOUNTER — Other Ambulatory Visit: Payer: Self-pay | Admitting: Cardiovascular Disease

## 2022-02-02 ENCOUNTER — Encounter: Payer: Self-pay | Admitting: Dermatology

## 2022-02-02 ENCOUNTER — Ambulatory Visit (INDEPENDENT_AMBULATORY_CARE_PROVIDER_SITE_OTHER): Payer: BC Managed Care – PPO | Admitting: Dermatology

## 2022-02-02 DIAGNOSIS — Z1283 Encounter for screening for malignant neoplasm of skin: Secondary | ICD-10-CM

## 2022-02-02 DIAGNOSIS — L219 Seborrheic dermatitis, unspecified: Secondary | ICD-10-CM

## 2022-02-02 MED ORDER — CLOBETASOL PROPIONATE 0.05 % EX FOAM: Freq: Every day | CUTANEOUS | 4 refills | Status: DC

## 2022-02-24 ENCOUNTER — Encounter: Payer: Self-pay | Admitting: Dermatology

## 2022-02-24 NOTE — Progress Notes (Signed)
   Follow-Up Visit   Subjective  Charles Montoya is a 51 y.o. male who presents for the following: Annual Exam (? Psoriasis on scalp - seems to come and goes- tx - none).  Annual skin examination, check rash on scalp Location:  Duration:  Quality:  Associated Signs/Symptoms: Modifying Factors:  Severity:  Timing: Context:   Objective  Well appearing patient in no apparent distress; mood and affect are within normal limits. Full body skin examination: No atypical pigmented lesions (all checked with dermoscopy), no nonmelanoma skin cancer.  Mid Parietal Scalp Patchy inflamed scale at better fits seborrheic dermatitis than true psoriasis.  No sign of psoriasis elsewhere on skin.    A full examination was performed including scalp, head, eyes, ears, nose, lips, neck, chest, axillae, abdomen, back, buttocks, bilateral upper extremities, bilateral lower extremities, hands, feet, fingers, toes, fingernails, and toenails. All findings within normal limits unless otherwise noted below.  Areas beneath undergarments not fully examined.   Assessment & Plan    Encounter for screening for malignant neoplasm of skin  Normal skin examination, encouraged to self examine twice yearly.  Continue ultraviolet protection.  Seborrheic dermatitis Mid Parietal Scalp  Topical clobetasol daily for 3 weeks, taper if improves.  May introduce medicated shampoo after initial clearing.  clobetasol (OLUX) 0.05 % topical foam - Mid Parietal Scalp Apply topically daily.      I, Charles Monarch, MD, have reviewed all documentation for this visit.  The documentation on 02/24/22 for the exam, diagnosis, procedures, and orders are all accurate and complete.

## 2022-03-13 IMAGING — CR DG CHEST 2V
2 series · 2 of 2 positions shown · non-contrast
Comparison: Chest radiograph 12/27/2019

CLINICAL DATA: Patient here from home with complaints of cental
chest pain radiating into his neck and down his left arm that
started 3 days ago. Hx of same. Pt reported a medical hx of CAD and
stents.

EXAM:
CHEST - 2 VIEW

[w chest pa]
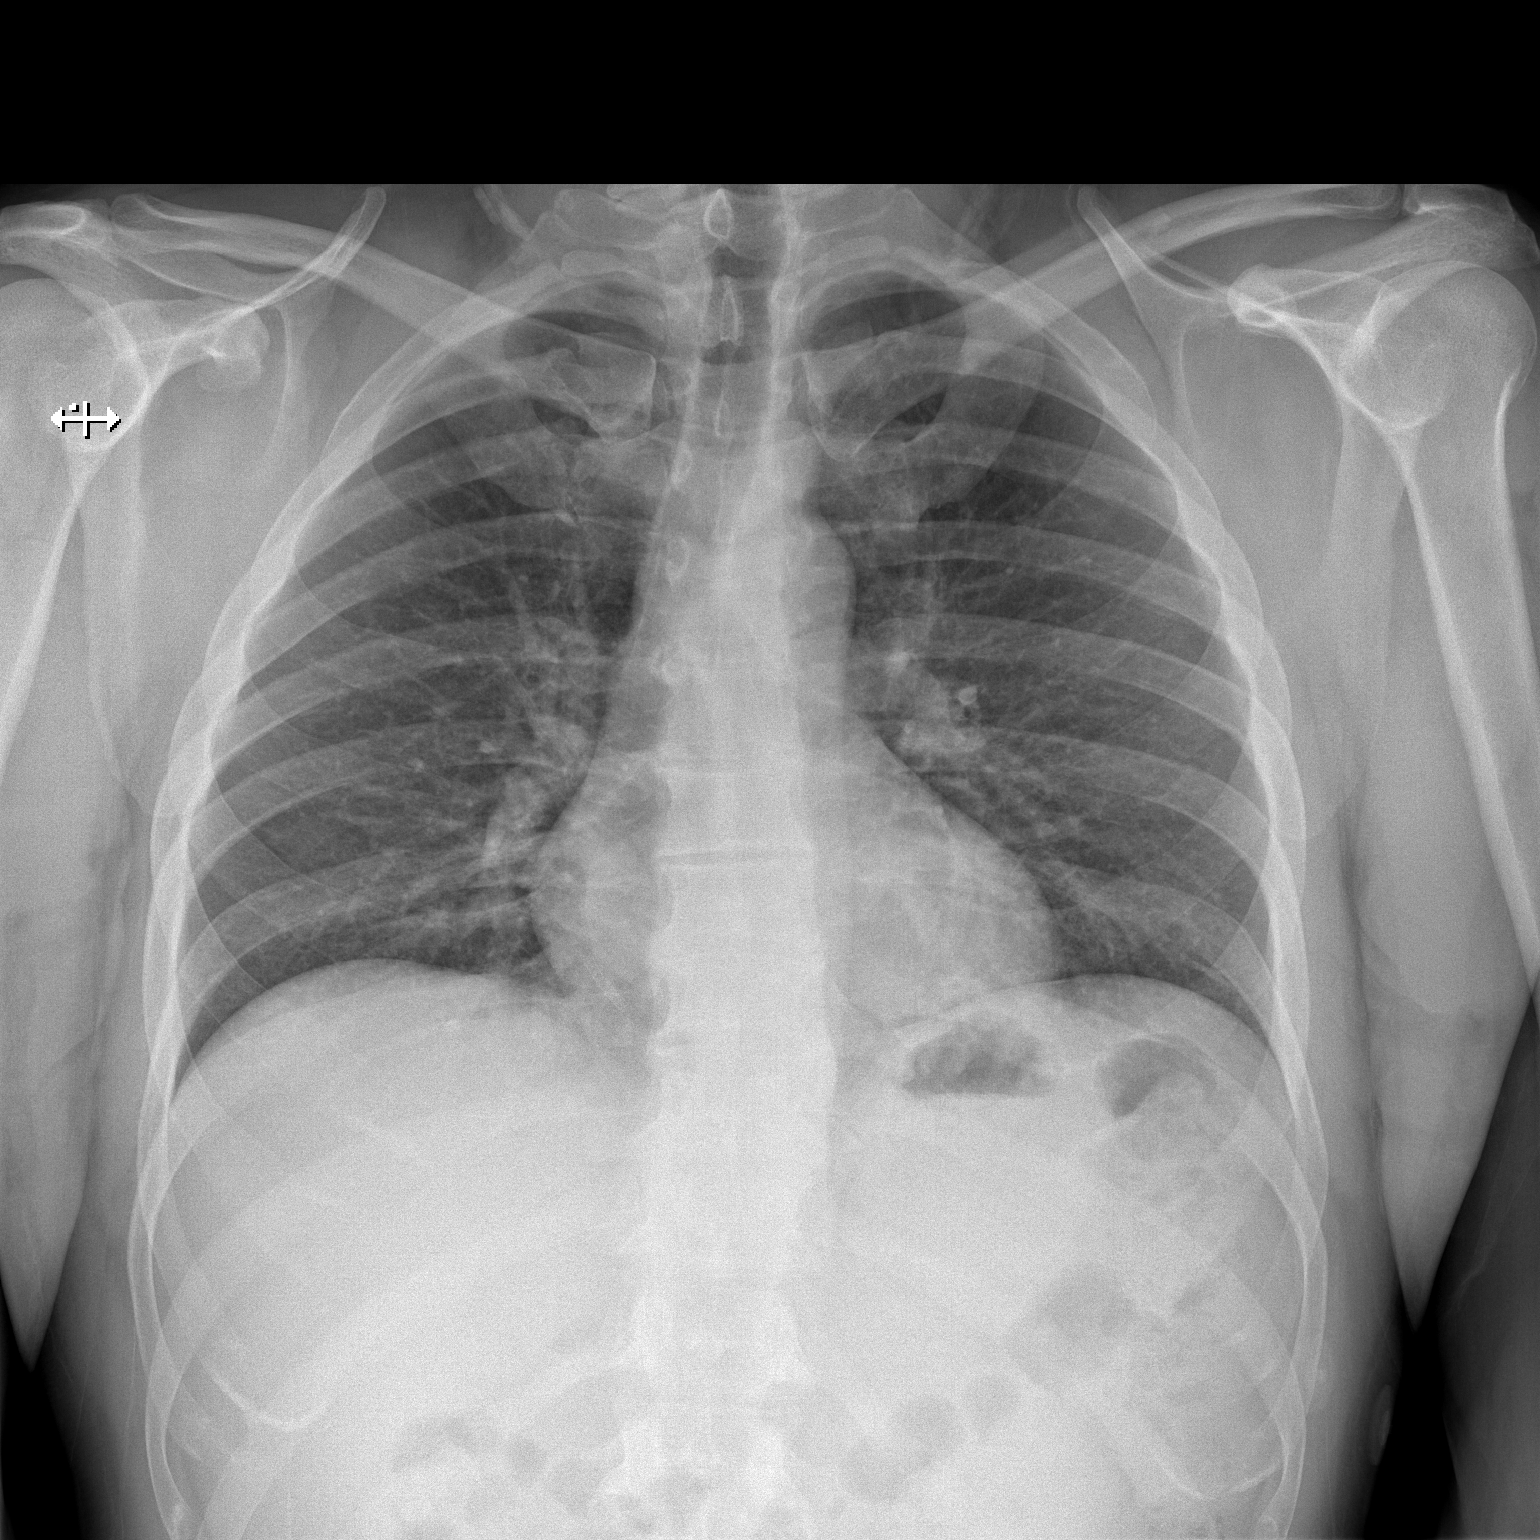

[w chest lat]
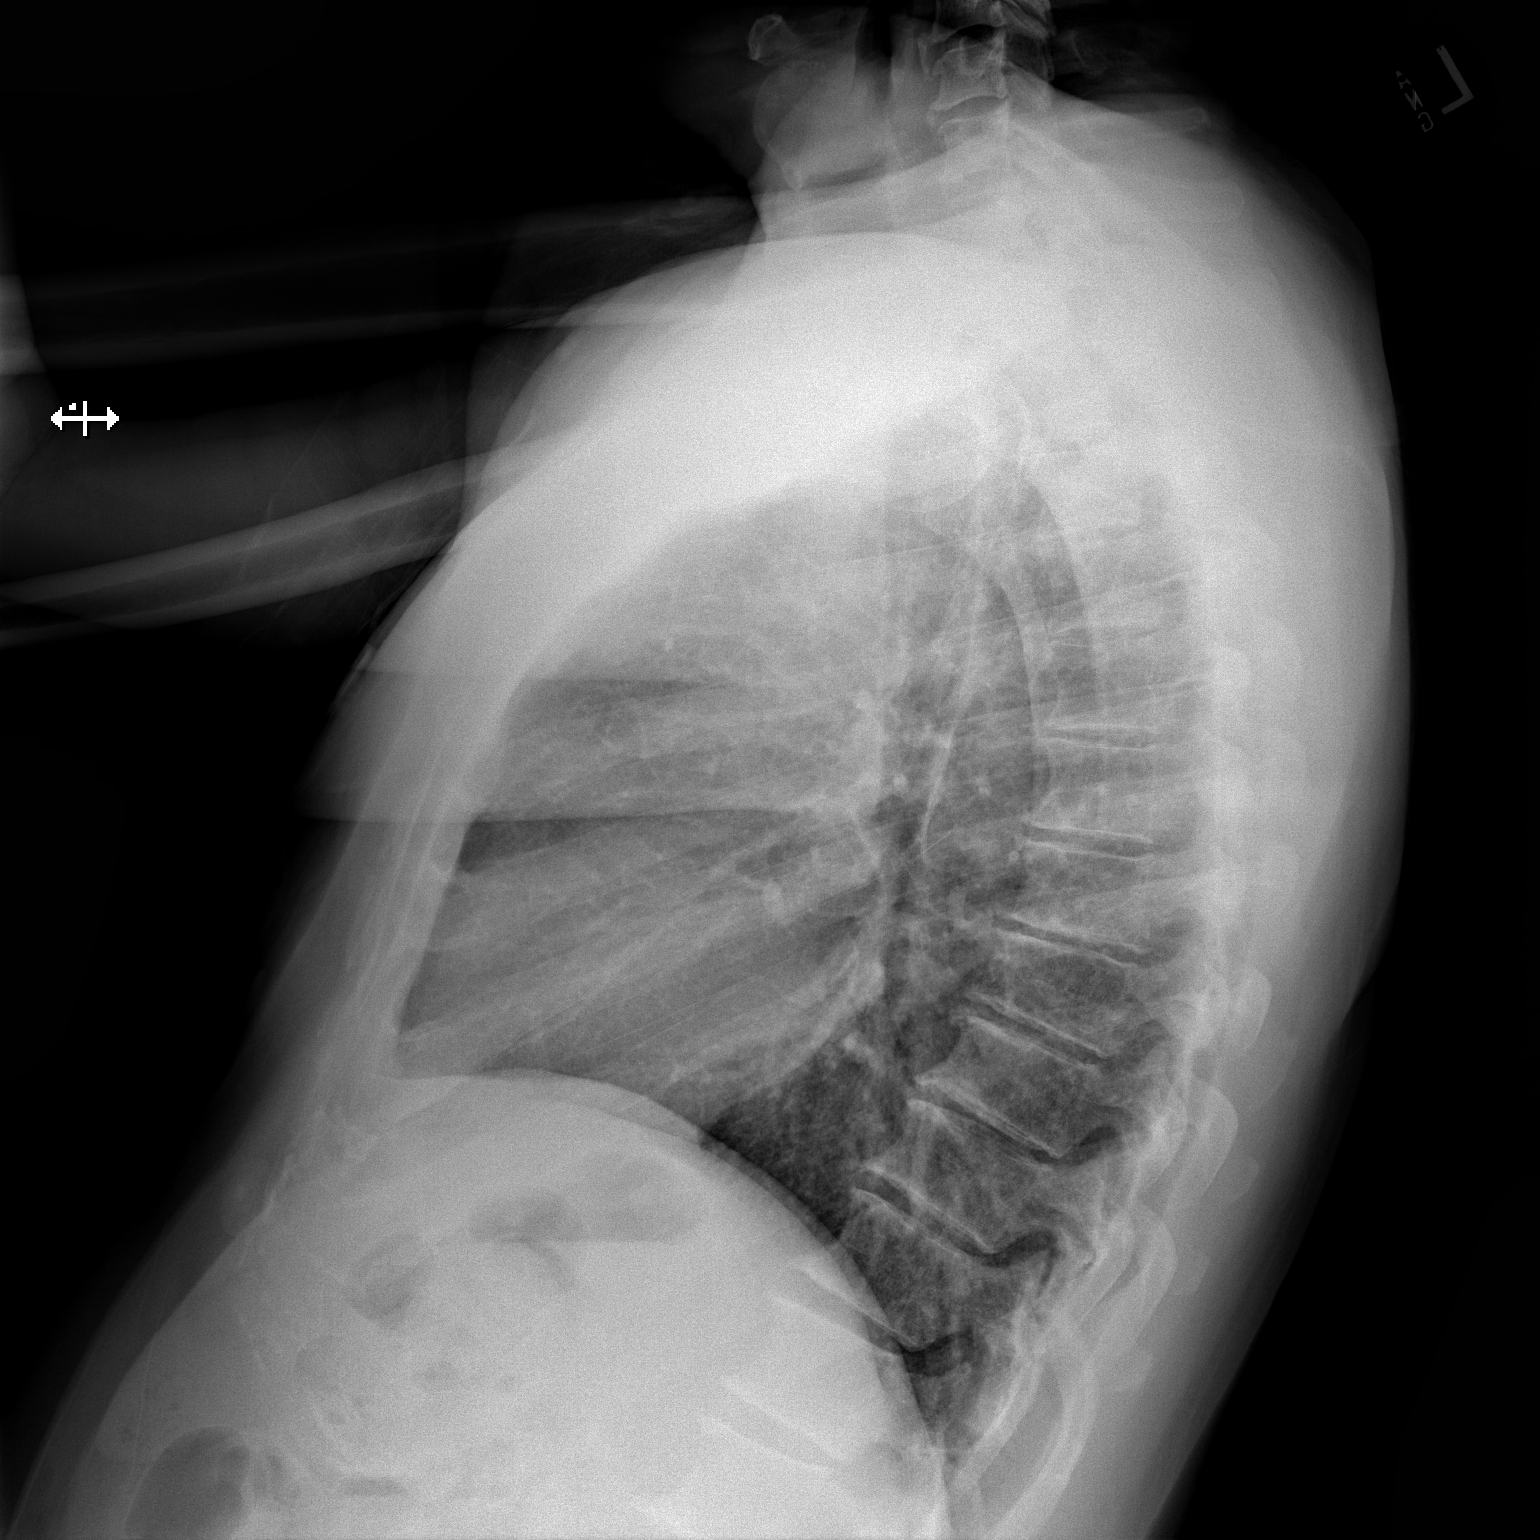

[2 of 2 positions shown; findings below may reference images not displayed]

FINDINGS: The cardiomediastinal contours are within normal limits. The lungs
are clear. No pneumothorax or pleural effusion. No acute finding in
the visualized skeleton.
IMPRESSION: No acute cardiopulmonary finding.

## 2022-06-29 ENCOUNTER — Other Ambulatory Visit: Payer: Self-pay | Admitting: Cardiovascular Disease

## 2022-07-13 DIAGNOSIS — M9902 Segmental and somatic dysfunction of thoracic region: Secondary | ICD-10-CM | POA: Diagnosis not present

## 2022-07-13 DIAGNOSIS — M62838 Other muscle spasm: Secondary | ICD-10-CM | POA: Diagnosis not present

## 2022-07-13 DIAGNOSIS — M9901 Segmental and somatic dysfunction of cervical region: Secondary | ICD-10-CM | POA: Diagnosis not present

## 2022-07-13 DIAGNOSIS — M546 Pain in thoracic spine: Secondary | ICD-10-CM | POA: Diagnosis not present

## 2022-08-30 ENCOUNTER — Other Ambulatory Visit: Payer: Self-pay | Admitting: Family Medicine

## 2022-08-30 DIAGNOSIS — R1031 Right lower quadrant pain: Secondary | ICD-10-CM

## 2022-08-31 ENCOUNTER — Ambulatory Visit
Admission: RE | Admit: 2022-08-31 | Discharge: 2022-08-31 | Disposition: A | Discharge status: Home / Self Care | Payer: BC Managed Care – PPO | Source: Ambulatory Visit | Attending: Family Medicine | Admitting: Family Medicine

## 2022-08-31 DIAGNOSIS — I7410 Embolism and thrombosis of unspecified parts of aorta: Secondary | ICD-10-CM | POA: Diagnosis not present

## 2022-08-31 DIAGNOSIS — I745 Embolism and thrombosis of iliac artery: Secondary | ICD-10-CM | POA: Diagnosis not present

## 2022-08-31 DIAGNOSIS — I723 Aneurysm of iliac artery: Secondary | ICD-10-CM | POA: Diagnosis not present

## 2022-08-31 DIAGNOSIS — R1031 Right lower quadrant pain: Secondary | ICD-10-CM

## 2022-08-31 DIAGNOSIS — N281 Cyst of kidney, acquired: Secondary | ICD-10-CM | POA: Diagnosis not present

## 2022-08-31 MED ORDER — IOPAMIDOL (ISOVUE-300) INJECTION 61%
100.0000 mL | Freq: Once | INTRAVENOUS | Status: AC | PRN
  Administered 2022-08-31: 100 mL via INTRAVENOUS

## 2022-09-01 ENCOUNTER — Other Ambulatory Visit (HOSPITAL_COMMUNITY): Payer: Self-pay | Admitting: Family Medicine

## 2022-09-01 DIAGNOSIS — M898X9 Other specified disorders of bone, unspecified site: Secondary | ICD-10-CM

## 2022-09-01 DIAGNOSIS — M899 Disorder of bone, unspecified: Secondary | ICD-10-CM

## 2022-09-02 ENCOUNTER — Encounter: Payer: Self-pay | Admitting: Vascular Surgery

## 2022-09-02 ENCOUNTER — Other Ambulatory Visit: Payer: Self-pay

## 2022-09-02 ENCOUNTER — Ambulatory Visit (INDEPENDENT_AMBULATORY_CARE_PROVIDER_SITE_OTHER): Payer: BC Managed Care – PPO | Admitting: Vascular Surgery

## 2022-09-02 VITALS — BP 152/97 | HR 65 | Temp 97.7°F | Resp 18 | Ht 70.0 in | Wt 187.6 lb

## 2022-09-02 DIAGNOSIS — I7772 Dissection of iliac artery: Secondary | ICD-10-CM

## 2022-09-02 NOTE — Progress Notes (Signed)
Patient ID: Charles Montoya, male   DOB: Jan 02, 1971, 52 y.o.   MRN: [id number]  Reason for Consult: New Patient (Initial Visit) and Aneurysm (Iliac artery aneursm   CT abdomen 08-31-2022 )   Referred by Saintclair Halsted, FNP  Subjective:     HPI:  Charles Montoya is a 52 y.o. male with history of coronary artery disease status post stenting and maintained on Brilinta.  Previously was a smoker but quit after his coronary stent.  More recently was having right lower quadrant pain underwent CT scan was found to have aortic and common iliac artery anomalies.  He states this has been very difficult for him to process mentally but otherwise he thinks that maybe the pain is similar if not somewhat improving.  He does not know of any injuries that he had.  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  His mother does have an aortic aneurysm  Past Medical History:  Diagnosis Date   Anxiety    CAD (coronary artery disease)    a. cath 12/2016--> s/p DES to RPDA, 50% dRCA, 25% pLAD, 40% mLAD, 25% pro to mid CX   Tobacco abuse    Family History  Problem Relation Age of Onset   CAD Father    Hypertension Mother    Past Surgical History:  Procedure Laterality Date   CARDIAC CATHETERIZATION     CORONARY STENT INTERVENTION N/A 01/19/2017   Procedure: Coronary Stent Intervention;  Surgeon: Jettie Booze, MD;  Location: [place] CV LAB;  Service: Cardiovascular;  Laterality: N/A;   HERNIA REPAIR     LEFT HEART CATH AND CORONARY ANGIOGRAPHY N/A 01/19/2017   Procedure: Left Heart Cath and Coronary Angiography;  Surgeon: Jettie Booze, MD;  Location: [place] CV [facility];  Service: Cardiovascular;  Laterality: N/A;    Short Social History:  Social History   Tobacco Use   Smoking status: Former    Packs/day: 1.50    Types: Cigarettes    Quit date: 01/19/2017    Years since quitting: 5.6   Smokeless tobacco: Never   Tobacco comments:    Using nicoderm gum   Substance Use Topics   Alcohol use: No    Allergies  Allergen Reactions   Codeine Itching   Lactose Intolerance (Gi) Other (See Comments)    Abdominal pain    Other Itching    MSG food allergy    Praluent [Alirocumab] Other (See Comments)    Fatigue, weakness, back pain, depression    Repatha [Evolocumab] Other (See Comments)    Fatigue, weakness, back pain, depression    Statins Other (See Comments)    Atorvastatin, Rosuvastatin, Simvastatin - generalized fatigue, weakness,  Muscle pain    Current Outpatient Medications  Medication Sig Dispense Refill   ALPRAZolam (XANAX) 0.5 MG tablet Take 0.25-0.5 mg by mouth 3 (three) times daily as needed for anxiety or sleep. Anxiety     Bempedoic Acid (NEXLETOL) 180 MG TABS Take 1 tablet by mouth daily. 90 tablet 3   calcium carbonate (TUMS - DOSED IN MG ELEMENTAL CALCIUM) 500 MG chewable tablet Chew 2 tablets by mouth 4 (four) times daily as needed for indigestion or heartburn.     clobetasol (OLUX) 0.05 % topical foam Apply topically daily. 50 g 4   clomiPRAMINE (ANAFRANIL) 25 MG capsule Take 25 mg by mouth at bedtime.     clotrimazole-betamethasone (LOTRISONE) cream Apply topically 2 (two) times daily as needed.  Apply topically 2 (two) times daily as needed.     ezetimibe (ZETIA) 10 MG tablet Take 1 tablet (10 mg total) by mouth daily. 30 tablet 11   nicotine polacrilex (NICORETTE) 4 MG gum Take 4 mg by mouth as needed for smoking cessation.     nitroGLYCERIN (NITROSTAT) 0.4 MG SL tablet 1 TAB UNDER TONGUE AS NEEDED FOR CHEST PAIN. MAY REPEAT EVERY 5 MIN FOR A TOTAL OF 3 DOSES. 25 tablet 11   sildenafil (REVATIO) 20 MG tablet Take 2-5 tablets by mouth. 30 minutes prior to sexual intercourse     ticagrelor (BRILINTA) 60 MG TABS tablet TAKE ONE TABLET BY MOUTH TWICE DAILY 60 tablet 11   tiZANidine (ZANAFLEX) 4 MG tablet Take 4 mg by mouth 3 (three) times daily.     cyclobenzaprine (FLEXERIL) 10 MG tablet Take 1 tablet (10 mg total) by mouth  2 (two) times daily as needed for muscle spasms. (Patient not taking: Reported on 09/02/2022) 20 tablet 0   No current facility-administered medications for this visit.    Review of Systems  Constitutional:  Constitutional negative. HENT: HENT negative.  Eyes: Eyes negative.  Respiratory: Respiratory negative.  Cardiovascular: Cardiovascular negative.  GI: Gastrointestinal negative.  Musculoskeletal:       Right groin pain Skin: Skin negative.  Neurological: Neurological negative. Hematologic: Positive for bruises/bleeds easily.  Psychiatric: Psychiatric negative.        Objective:  Objective   Vitals:   09/02/22 1125  BP: (!) 152/97  Pulse: 65  Resp: 18  Temp: 97.7 F (36.5 C)  TempSrc: Temporal  SpO2: 98%  Weight: 187 lb 9.6 oz (85.1 kg)  Height: '5\' 10"'$  (1.778 m)   Body mass index is 26.92 kg/m.  Physical Exam HENT:     Head: Normocephalic.     Nose: Nose normal.  Eyes:     Pupils: Pupils are equal, round, and reactive to light.  Cardiovascular:     Pulses:          Femoral pulses are 1+ on the right side and 2+ on the left side. Pulmonary:     Effort: Pulmonary effort is normal.  Abdominal:     General: Abdomen is flat.  Musculoskeletal:        General: Normal range of motion.     Right lower leg: No edema.     Left lower leg: No edema.  Skin:    General: Skin is warm.     Capillary Refill: Capillary refill takes less than 2 seconds.  Neurological:     General: No focal deficit present.     Mental Status: He is alert.  Psychiatric:        Mood and Affect: Mood normal.        Behavior: Behavior normal.        Thought Content: Thought content normal.        Judgment: Judgment normal.     Data: CT IMPRESSION: 1. No acute abdominal/pelvic findings, mass lesions or adenopathy. 2. Age advanced atherosclerotic calcifications involving the aorta and iliac arteries. 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 Recommend vascular surgery consultation. 3. Scattered sclerotic bone lesions. Findings could be due to bone islands but recommend correlation with PSA level and whole-body bone scan may be helpful for further assessment. 4. Aortic atherosclerosis.     Assessment/Plan:    52 year old male experiencing right lower quadrant pain underwent CT scan  which demonstrated what appears to be a saccular aneurysm in evolving the right common iliac artery and calcifications and thrombus from the aorta down both common iliac arteries.  Given that his pain is right-sided and the disease process is in the aorta and extending down the left as well I discussed with the patient and reviewed the CT with him and this seems more likely to be an incidental finding then the cause of his underlying pain particularly since the pain is really more in the groin and the area of concern is higher towards the umbilicus and more in the retroperitoneal position.  Patient unfortunately no previous CT scans to compare.  As such we will get CT scan and 1 month for further evaluation.  The aorta is ectatic and he will require ongoing evaluation even if the CT demonstrates no change.  I discussed with the patient that although this appears chronic could be acute and if his pain worsens or he has any other concerning symptoms he should seek emergent medical attention.  Otherwise I will see him back in 1 month with repeat CT.     Waynetta Sandy MD [facility]

## 2022-09-08 DIAGNOSIS — Z130 Encounter for screening for diseases of the blood and blood-forming organs and certain disorders involving the immune mechanism: Secondary | ICD-10-CM | POA: Diagnosis not present

## 2022-09-08 DIAGNOSIS — Z0000 Encounter for general adult medical examination without abnormal findings: Secondary | ICD-10-CM | POA: Diagnosis not present

## 2022-09-08 DIAGNOSIS — E7800 Pure hypercholesterolemia, unspecified: Secondary | ICD-10-CM | POA: Diagnosis not present

## 2022-09-08 DIAGNOSIS — Z131 Encounter for screening for diabetes mellitus: Secondary | ICD-10-CM | POA: Diagnosis not present

## 2022-09-08 DIAGNOSIS — Z125 Encounter for screening for malignant neoplasm of prostate: Secondary | ICD-10-CM | POA: Diagnosis not present

## 2022-09-09 ENCOUNTER — Encounter (HOSPITAL_COMMUNITY)
Admission: RE | Admit: 2022-09-09 | Discharge: 2022-09-09 | Disposition: A | Discharge status: Home / Self Care | Payer: BC Managed Care – PPO | Source: Ambulatory Visit | Attending: Family Medicine | Admitting: Family Medicine

## 2022-09-09 DIAGNOSIS — R1031 Right lower quadrant pain: Secondary | ICD-10-CM | POA: Diagnosis not present

## 2022-09-09 DIAGNOSIS — M898X9 Other specified disorders of bone, unspecified site: Secondary | ICD-10-CM | POA: Diagnosis not present

## 2022-09-09 DIAGNOSIS — M899 Disorder of bone, unspecified: Secondary | ICD-10-CM | POA: Diagnosis not present

## 2022-09-09 DIAGNOSIS — M170 Bilateral primary osteoarthritis of knee: Secondary | ICD-10-CM | POA: Diagnosis not present

## 2022-09-09 MED ORDER — TECHNETIUM TC 99M MEDRONATE IV KIT
20.0000 | PACK | Freq: Once | INTRAVENOUS | Status: AC | PRN
  Administered 2022-09-09: 19.7 via INTRAVENOUS

## 2022-09-28 ENCOUNTER — Ambulatory Visit
Admission: RE | Admit: 2022-09-28 | Discharge: 2022-09-28 | Disposition: A | Discharge status: Home / Self Care | Payer: BC Managed Care – PPO | Source: Ambulatory Visit | Attending: Vascular Surgery | Admitting: Vascular Surgery

## 2022-09-28 DIAGNOSIS — I700 Atherosclerosis of aorta: Secondary | ICD-10-CM | POA: Diagnosis not present

## 2022-09-28 DIAGNOSIS — I771 Stricture of artery: Secondary | ICD-10-CM | POA: Diagnosis not present

## 2022-09-28 DIAGNOSIS — I723 Aneurysm of iliac artery: Secondary | ICD-10-CM | POA: Diagnosis not present

## 2022-09-28 DIAGNOSIS — I7772 Dissection of iliac artery: Secondary | ICD-10-CM

## 2022-09-28 MED ORDER — IOPAMIDOL (ISOVUE-370) INJECTION 76%
75.0000 mL | Freq: Once | INTRAVENOUS | Status: AC | PRN
  Administered 2022-09-28: 75 mL via INTRAVENOUS

## 2022-09-29 ENCOUNTER — Ambulatory Visit (INDEPENDENT_AMBULATORY_CARE_PROVIDER_SITE_OTHER): Payer: BC Managed Care – PPO | Admitting: Vascular Surgery

## 2022-09-29 ENCOUNTER — Encounter: Payer: Self-pay | Admitting: Vascular Surgery

## 2022-09-29 VITALS — BP 137/89 | HR 68 | Temp 98.0°F | Resp 20 | Ht 70.0 in | Wt 186.8 lb

## 2022-09-29 DIAGNOSIS — I7772 Dissection of iliac artery: Secondary | ICD-10-CM | POA: Diagnosis not present

## 2022-09-29 NOTE — Progress Notes (Signed)
Patient ID: Charles Montoya, male   DOB: 13-Feb-1971, 52 y.o.   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  Previously was a smoker but quit several years ago.  Recently had right lower quadrant pain underwent CT scan which demonstrated abnormalities of his aorta and common iliac arteries bilaterally.  He did not have any previous scans to compare so we are now following up with CT angio for better evaluation.  He states that the pain in his right groin did subside but then recurred and has been intermittent in nature possibly due to straining.  No new back pain.  Here today to discuss results of CT scan.  Past Medical History:  Diagnosis Date   Anxiety    CAD (coronary artery disease)    a. cath 12/2016--> s/p DES to RPDA, 50% dRCA, 25% pLAD, 40% mLAD, 25% pro to mid CX   Tobacco abuse    Family History  Problem Relation Age of Onset   CAD Father    Hypertension Mother    Past Surgical History:  Procedure Laterality Date   CARDIAC CATHETERIZATION     CORONARY STENT INTERVENTION N/A 01/19/2017   Procedure: Coronary Stent Intervention;  Surgeon: Jettie Booze, MD;  Location: [facility];  Service: Cardiovascular;  Laterality: N/A;   HERNIA REPAIR     LEFT HEART CATH AND CORONARY ANGIOGRAPHY N/A 01/19/2017   Procedure: Left Heart Cath and Coronary Angiography;  Surgeon: Jettie Booze, MD;  Location: [facility];  Service: Cardiovascular;  Laterality: N/A;    Short Social History:  Social History   Tobacco Use   Smoking status: Former    Packs/day: 1.50    Types: Cigarettes    Quit date: 01/19/2017    Years since quitting: 5.6   Smokeless tobacco: Never   Tobacco comments:    Using nicoderm gum  Substance Use Topics   Alcohol use: No    Allergies   Allergen Reactions   Codeine Itching   Lactose Intolerance (Gi) Other (See Comments)    Abdominal pain    Other Itching    MSG food allergy    Praluent [Alirocumab] Other (See Comments)    Fatigue, weakness, back pain, depression    Repatha [Evolocumab] Other (See Comments)    Fatigue, weakness, back pain, depression    Statins Other (See Comments)    Atorvastatin, Rosuvastatin, Simvastatin - generalized fatigue, weakness,  Muscle pain    Current Outpatient Medications  Medication Sig Dispense Refill   ALPRAZolam (XANAX) 0.5 MG tablet Take 0.25-0.5 mg by mouth 3 (three) times daily as needed for anxiety or sleep. Anxiety     Bempedoic Acid (NEXLETOL) 180 MG TABS Take 1 tablet by mouth daily. 90 tablet 3   calcium carbonate (TUMS - DOSED IN MG ELEMENTAL CALCIUM) 500 MG chewable tablet Chew 2 tablets by mouth 4 (four) times daily as needed for indigestion or heartburn.     clobetasol (OLUX) 0.05 % topical foam Apply topically daily. 50 g 4   clomiPRAMINE (ANAFRANIL) 25 MG capsule Take 25 mg by mouth at bedtime.     clotrimazole-betamethasone (LOTRISONE) cream Apply topically 2 (two) times daily as needed. Apply topically 2 (two) times daily as needed.     ezetimibe (ZETIA) 10  MG tablet Take 1 tablet (10 mg total) by mouth daily. 30 tablet 11   nicotine polacrilex (NICORETTE) 4 MG gum Take 4 mg by mouth as needed for smoking cessation.     nitroGLYCERIN (NITROSTAT) 0.4 MG SL tablet 1 TAB UNDER TONGUE AS NEEDED FOR CHEST PAIN. MAY REPEAT EVERY 5 MIN FOR A TOTAL OF 3 DOSES. 25 tablet 11   sildenafil (REVATIO) 20 MG tablet Take 2-5 tablets by mouth. 30 minutes prior to sexual intercourse     ticagrelor (BRILINTA) 60 MG TABS tablet TAKE ONE TABLET BY MOUTH TWICE DAILY 60 tablet 11   tiZANidine (ZANAFLEX) 4 MG tablet Take 4 mg by mouth 3 (three) times daily.     cyclobenzaprine (FLEXERIL) 10 MG tablet Take 1 tablet (10 mg total) by mouth 2 (two) times daily as needed for muscle spasms. (Patient  not taking: Reported on 09/02/2022) 20 tablet 0   No current facility-administered medications for this visit.    Review of Systems  Constitutional:  Constitutional negative. HENT: HENT negative.  Eyes: Eyes negative.  Cardiovascular: Cardiovascular negative.  GI: Gastrointestinal negative.  Musculoskeletal:       Intermittent right groin pain Skin: Skin negative.  Neurological: Neurological negative. Hematologic: Hematologic/lymphatic negative.  Psychiatric: Psychiatric negative.        Objective:  Objective   Vitals:   09/29/22 1121  BP: 137/89  Pulse: 68  Resp: 20  Temp: 98 F (36.7 C)  SpO2: 98%  Weight: 186 lb 12.8 oz (84.7 kg)  Height: '5\' 10"'$  (1.778 m)   Body mass index is 26.8 kg/m.  Physical Exam HENT:     Head: Normocephalic.     Nose: Nose normal.  Eyes:     Pupils: Pupils are equal, round, and reactive to light.  Cardiovascular:     Rate and Rhythm: Normal rate.     Pulses: Normal pulses.  Pulmonary:     Effort: Pulmonary effort is normal.  Abdominal:     General: Abdomen is flat.     Palpations: Abdomen is soft.  Musculoskeletal:        General: Normal range of motion.     Cervical back: Normal range of motion and neck supple.     Right lower leg: No edema.     Left lower leg: No edema.  Skin:    General: Skin is warm.     Capillary Refill: Capillary refill takes less than 2 seconds.  Neurological:     Mental Status: He is alert.  Psychiatric:        Mood and Affect: Mood normal.        Thought Content: Thought content normal.        Judgment: Judgment normal.     Data: CT IMPRESSION: 1. Small left posterior wall ulcerated plaque/penetrating ulcer of the abdominal aorta just above the IMA origin. Just below the IMA, the distal aorta demonstrates mild non-aneurysmal bulge with maximal diameter of 2.6 cm. 2. Aneurysmal disease of the distal right common iliac artery with maximum diameter of approximately 2.5 cm. Prior to the common  iliac bifurcation, focal stenosis of the distal common iliac artery lumen approaches 50% luminal narrowing. 3. Elongated ulcerated plaque along the posterolateral wall of the mid to distal left common iliac artery. There is some associated mild luminal stenosis of the distal left common iliac artery of approximately 30-40%. 4. Non-vascular: 5. No acute findings in the abdomen or pelvis. 6. Small areas of sclerosis in the bony pelvis are nonspecific  with many likely representing benign bone islands. Note was made on recent bone scan of mildly increased uptake along the anterior aspect of the left iliac bone with a corresponding area of mild sclerosis noted by CT. No associated cortical disruption.       Assessment/Plan:    52 year old male with history of right lower quadrant and groin pain found to have abnormality of the distal aorta measuring 2.6 cm and right common neck artery measuring 2.5 cm with 50% stenosis and also plaque extending into the distal left common iliac artery with mild stenosis there as well he is asymptomatic from the standpoint.  Will follow-up with duplex in 6 months and if that remains stable can transition up to 1 year follow-up.  All questions were answered today.     Waynetta Sandy MD [facility]

## 2022-10-01 ENCOUNTER — Other Ambulatory Visit: Payer: Self-pay

## 2022-10-01 DIAGNOSIS — I7772 Dissection of iliac artery: Secondary | ICD-10-CM

## 2022-11-03 DIAGNOSIS — R972 Elevated prostate specific antigen [PSA]: Secondary | ICD-10-CM | POA: Diagnosis not present

## 2022-11-05 DIAGNOSIS — F4010 Social phobia, unspecified: Secondary | ICD-10-CM | POA: Diagnosis not present

## 2022-11-05 DIAGNOSIS — F410 Panic disorder [episodic paroxysmal anxiety] without agoraphobia: Secondary | ICD-10-CM | POA: Diagnosis not present

## 2022-11-18 ENCOUNTER — Encounter: Payer: Self-pay | Admitting: Cardiovascular Disease

## 2022-11-24 ENCOUNTER — Encounter: Payer: Self-pay | Admitting: Vascular Surgery

## 2022-11-24 DIAGNOSIS — M899 Disorder of bone, unspecified: Secondary | ICD-10-CM | POA: Diagnosis not present

## 2022-12-02 NOTE — Progress Notes (Signed)
Office Visit    Patient Name: Charles Montoya Date of Encounter: 12/03/2022  Primary Care Provider:  Darrow Bussing, MD Primary Cardiologist:  Kristeen Miss, MD Primary Electrophysiologist: None   Past Medical History    Past Medical History:  Diagnosis Date   Anxiety    CAD (coronary artery disease)    a. cath 12/2016--> s/p DES to RPDA, 50% dRCA, 25% pLAD, 40% mLAD, 25% pro to mid CX   Tobacco abuse    Past Surgical History:  Procedure Laterality Date   CARDIAC CATHETERIZATION     CORONARY STENT INTERVENTION N/A 01/19/2017   Procedure: Coronary Stent Intervention;  Surgeon: Corky Crafts, MD;  Location: [facility];  Service: Cardiovascular;  Laterality: N/A;   HERNIA REPAIR     LEFT HEART CATH AND CORONARY ANGIOGRAPHY N/A 01/19/2017   Procedure: Left Heart Cath and Coronary Angiography;  Surgeon: Corky Crafts, MD;  Location: [facility];  Service: Cardiovascular;  Laterality: N/A;    Allergies  Allergies  Allergen Reactions   Codeine Itching   Lactose Intolerance (Gi) Other (See Comments)    Abdominal pain    Other Itching    MSG food allergy    Praluent [Alirocumab] Other (See Comments)    Fatigue, weakness, back pain, depression    Repatha [Evolocumab] Other (See Comments)    Fatigue, weakness, back pain, depression    Statins Other (See Comments)    Atorvastatin, Rosuvastatin, Simvastatin - generalized fatigue, weakness,  Muscle pain     History of Present Illness    Charles Montoya  is a 52 year old male with a PMH of NSTEMI 2018 treated with LHC and PCI/DES to RPDA, 50% the RCA, 25% pLAD, 40% M LAD, 25% proximal to mid circumflex, HLD (intolerant to statins, Repatha, Praluent, anxiety, iliac artery aneurysm former tobacco abuse who presents today for 1 year follow-up.   Charles Montoya was initially seen in 12/2016 for NSTEMI that was treated with CAD s/p DES to RPDA x 1.  He was seen in the ED 12/2019 with complaint of chest pain.  Troponins  were negative and EKG was without acute ischemic changes.  He underwent a exercise Myoview that showed on 03/2020 that showed no evidence of ischemia.  He has been followed in the lipid clinic by Dr. Rennis Golden and Margaretmary Dys.  He was last seen by Dr. Elease Hashimoto on 11/23/2021 and was doing well with no complaints of chest pain since previous visit.  He was tolerating Zetia at that time and wanted to start bempedoic acid.  He developed right lower quadrant pain and underwent a CT scan and was found to have aortic and common iliac artery anomalies suggestive of aneurysm.  He is currently followed by Dr. Randie Heinz and was seen most recently 09/2022 underwent EP CT for surveillance with plan to complete duplex in 6 months.  Charles Montoya presents today alone for 1 year follow-up.  Since last being seen in the office patient reports that he is doing well with no new cardiac complaints since his previous visit.  His blood pressure today is mildly elevated at 130/90.  Patient reports that pressures have been controlled at home with diastolics being in the mid 80s.  He suffers from anxiety which may also be a cause for his elevated BP.  He is currently tolerating his ezetimibe however he is not taking bempedoic acid due to fear of adverse effects.  We discussed in detail the importance of  establishing medications to help reduce his coronary risk and control his cholesterol.  We also discussed the importance of increasing his physical activity.  Patient denies chest pain, palpitations, dyspnea, PND, orthopnea, nausea, vomiting, dizziness, syncope, edema, weight gain, or early satiety.   Home Medications    Current Outpatient Medications  Medication Sig Dispense Refill   ALPRAZolam (XANAX) 0.5 MG tablet Take 0.25-0.5 mg by mouth 3 (three) times daily as needed for anxiety or sleep. Anxiety     Bempedoic Acid (NEXLETOL) 180 MG TABS Take 1 tablet by mouth daily. 90 tablet 3   calcium carbonate (TUMS - DOSED IN MG ELEMENTAL CALCIUM) 500 MG  chewable tablet Chew 2 tablets by mouth 4 (four) times daily as needed for indigestion or heartburn.     clomiPRAMINE (ANAFRANIL) 25 MG capsule Take 25 mg by mouth at bedtime.     clotrimazole-betamethasone (LOTRISONE) cream Apply topically 2 (two) times daily as needed. Apply topically 2 (two) times daily as needed.     ezetimibe (ZETIA) 10 MG tablet Take 1 tablet (10 mg total) by mouth daily. 30 tablet 11   nicotine polacrilex (NICORETTE) 4 MG gum Take 4 mg by mouth as needed for smoking cessation.     nitroGLYCERIN (NITROSTAT) 0.4 MG SL tablet 1 TAB UNDER TONGUE AS NEEDED FOR CHEST PAIN. MAY REPEAT EVERY 5 MIN FOR A TOTAL OF 3 DOSES. 25 tablet 11   sildenafil (REVATIO) 20 MG tablet Take 2-5 tablets by mouth. 30 minutes prior to sexual intercourse     ticagrelor (BRILINTA) 60 MG TABS tablet TAKE ONE TABLET BY MOUTH TWICE DAILY 60 tablet 11   tiZANidine (ZANAFLEX) 4 MG tablet Take 4 mg by mouth 3 (three) times daily.     No current facility-administered medications for this visit.     Review of Systems  Please see the history of present illness.    (+) Anxiety  All other systems reviewed and are otherwise negative except as noted above.  Physical Exam    Wt Readings from Last 3 Encounters:  12/03/22 186 lb 12.8 oz (84.7 kg)  09/29/22 186 lb 12.8 oz (84.7 kg)  09/02/22 187 lb 9.6 oz (85.1 kg)   VS: Vitals:   12/03/22 1023  BP: (!) 130/90  Pulse: 75  SpO2: 97%  ,Body mass index is 26.8 kg/m.  Constitutional:      Appearance: Healthy appearance. Not in distress.  Neck:     Vascular: JVD normal.  Pulmonary:     Effort: Pulmonary effort is normal.     Breath sounds: No wheezing. No rales. Diminished in the bases Cardiovascular:     Normal rate. Regular rhythm. Normal S1. Normal S2.      Murmurs: There is no murmur.  Edema:    Peripheral edema absent.  Abdominal:     Palpations: Abdomen is soft non tender. There is no hepatomegaly.  Skin:    General: Skin is warm and dry.   Neurological:     General: No focal deficit present.     Mental Status: Alert and oriented to person, place and time.     Cranial Nerves: Cranial nerves are intact.  EKG/LABS/ Recent Cardiac Studies    ECG personally reviewed by me today -sinus rhythm with rate of 75 bpm with no acute changes consistent with previous EKG.  Cardiac Studies & Procedures   CARDIAC CATHETERIZATION  CARDIAC CATHETERIZATION 01/19/2017  Narrative  Dist RCA lesion, 50 %stenosed.  Prox Cx to Mid Cx lesion, 25 %stenosed.  Prox LAD to Mid LAD lesion, 25 %stenosed.  Mid LAD lesion, 40 %stenosed.  RPDA lesion, 90 %stenosed. This is the culprit lesion.  A STENT PROMUS PREM MR 2.5X16 drug eluting stent was successfully placed, and does not overlap previously placed stent.  Post intervention, there is a 0% residual stenosis.  The left ventricular systolic function is normal.  LV end diastolic pressure is normal.  The left ventricular ejection fraction is 55-65% by visual estimate.  There is no aortic valve stenosis.  Continue dual antiplatelet therapy for at least a year. He needs to stop smoking.  For now, continue IV tirofiban until this bottle runs out. He will need secondary prevention including lipid-lowering therapy.  Possible discharge tomorrow.  Findings Coronary Findings Diagnostic  Dominance: Right  Left Anterior Descending  Left Circumflex  Right Coronary Artery  Right Posterior Descending Artery Culprit lesion. The lesion is ulcerative.  Intervention  RPDA lesion Angioplasty Lesion crossed with guidewire using a WIRE ASAHI PROWATER 180CM. Pre-stent angioplasty was performed using a BALLOON SAPPHIRE 2.5X15. A STENT PROMUS PREM MR 2.5X16 drug eluting stent was successfully placed, and does not overlap previously placed stent. Stent strut is well apposed. The pre-interventional distal flow is normal (TIMI 3).  The post-interventional distal flow is normal (TIMI 3). The  intervention was successful . No complications occurred at this lesion. There is a 0% residual stenosis post intervention.   STRESS TESTS  MYOCARDIAL PERFUSION IMAGING 04/08/2020  Narrative  Nuclear stress EF: 60%.  There was no ST segment deviation noted during stress.  Defect 1: There is a medium defect of moderate severity present in the basal inferior and mid inferior location.  This is a low risk study.  The left ventricular ejection fraction is normal (55-65%).  Normal stress nuclear study with inferior thinning but no ischemia; EF 60 with normal wall motion.                  Lab Results  Component Value Date   WBC 7.7 03/22/2020   HGB 13.8 03/22/2020   HCT 39.2 03/22/2020   MCV 86.0 03/22/2020   PLT 240 03/22/2020   Lab Results  Component Value Date   CREATININE 1.26 06/09/2020   BUN 16 06/09/2020   NA 141 06/09/2020   K 4.2 06/09/2020   CL 102 06/09/2020   CO2 24 06/09/2020   Lab Results  Component Value Date   ALT 20 06/09/2020   AST 17 06/09/2020   ALKPHOS 70 06/09/2020   BILITOT 0.3 06/09/2020   Lab Results  Component Value Date   CHOL 282 (H) 06/09/2020   HDL 36 (L) 06/09/2020   LDLCALC 211 (H) 06/09/2020   TRIG 182 (H) 06/09/2020   CHOLHDL 7.8 (H) 06/09/2020    No results found for: "HGBA1C"   Assessment & Plan    1.  Coronary artery disease: -s/p NSTEMI with DES x 1 to RPDA in 2018.  Patient most recently underwent exercise Myoview that was normal in 2021. -Today patient reports no recurrent changes or cardiac complaints since previous visit. -Continue GDMT with Brilinta 60 mg daily, ezetimibe 10 mg daily -Patient has increased his physical activity and is eating clean and abstaining from saturated fats in his diet.  2.  Mixed hyperlipidemia: -Patient currently monitored closely in the lipid clinic due to numerous statin intolerance -He is tolerating ezetimibe without any adverse reactions and has not started Lipitor Cacit due to  fear of possible reactions.  3.  History of tobacco  abuse: -Patient reports that he is abstained from smoking since his prior heart attack and 2018. -Continued cessation is advised.  4.  Anxiety: -Patient reports ongoing anxiety which is currently being managed with Xanax as needed.   Disposition: Follow-up with Kristeen Miss, MD or APP in 12 months    Medication Adjustments/Labs and Tests Ordered: Current medicines are reviewed at length with the patient today.  Concerns regarding medicines are outlined above.   Signed, Napoleon Form, Leodis Rains, NP 12/03/2022, 12:33 PM [facility]

## 2022-12-03 ENCOUNTER — Encounter: Payer: Self-pay | Admitting: Nurse Practitioner

## 2022-12-03 ENCOUNTER — Ambulatory Visit: Payer: BC Managed Care – PPO | Attending: Nurse Practitioner | Admitting: Nurse Practitioner

## 2022-12-03 VITALS — BP 130/90 | HR 75 | Ht 70.0 in | Wt 186.8 lb

## 2022-12-03 DIAGNOSIS — I2510 Atherosclerotic heart disease of native coronary artery without angina pectoris: Secondary | ICD-10-CM | POA: Diagnosis not present

## 2022-12-03 DIAGNOSIS — E782 Mixed hyperlipidemia: Secondary | ICD-10-CM | POA: Diagnosis not present

## 2022-12-03 DIAGNOSIS — Z720 Tobacco use: Secondary | ICD-10-CM | POA: Diagnosis not present

## 2022-12-03 DIAGNOSIS — F419 Anxiety disorder, unspecified: Secondary | ICD-10-CM | POA: Diagnosis not present

## 2022-12-03 NOTE — Patient Instructions (Signed)
Medication Instructions:  MEGA REDS FISH OIL  *If you need a refill on your cardiac medications before your next appointment, please call your pharmacy*   Lab Work: NONE ORDERED If you have labs (blood work) drawn today and your tests are completely normal, you will receive your results only by: MyChart Message (if you have MyChart) OR A paper copy in the mail If you have any lab test that is abnormal or we need to change your treatment, we will call you to review the results.   Testing/Procedures: NONE ORDERED   Follow-Up: At [facility], you and your health needs are our priority.  As part of our continuing mission to provide you with exceptional heart care, we have created designated Provider Care Teams.  These Care Teams include your primary Cardiologist (physician) and Advanced Practice Providers (APPs -  Physician Assistants and Nurse Practitioners) who all work together to provide you with the care you need, when you need it.  We recommend signing up for the patient portal called "MyChart".  Sign up information is provided on this After Visit Summary.  MyChart is used to connect with patients for Virtual Visits (Telemedicine).  Patients are able to view lab/test results, encounter notes, upcoming appointments, etc.  Non-urgent messages can be sent to your provider as well.   To learn more about what you can do with MyChart, go to [email or web address].    Your next appointment:   12 month(s)  Provider:   Kristeen Miss, MD    Other Instructions

## 2023-02-09 ENCOUNTER — Ambulatory Visit: Payer: BC Managed Care – PPO | Admitting: Physician Assistant

## 2023-02-14 ENCOUNTER — Other Ambulatory Visit: Payer: Self-pay | Admitting: Cardiovascular Disease

## 2023-04-06 ENCOUNTER — Encounter: Payer: Self-pay | Admitting: Vascular Surgery

## 2023-04-06 ENCOUNTER — Ambulatory Visit (INDEPENDENT_AMBULATORY_CARE_PROVIDER_SITE_OTHER): Payer: BC Managed Care – PPO | Admitting: Vascular Surgery

## 2023-04-06 ENCOUNTER — Ambulatory Visit (HOSPITAL_COMMUNITY)
Admission: RE | Admit: 2023-04-06 | Discharge: 2023-04-06 | Disposition: A | Discharge status: Home / Self Care | Payer: BC Managed Care – PPO | Source: Ambulatory Visit | Attending: Vascular Surgery | Admitting: Vascular Surgery

## 2023-04-06 VITALS — BP 139/91 | HR 66 | Temp 97.9°F | Resp 20 | Ht 70.0 in | Wt 188.0 lb

## 2023-04-06 DIAGNOSIS — I7772 Dissection of iliac artery: Secondary | ICD-10-CM

## 2023-04-06 NOTE — Progress Notes (Signed)
Patient ID: Charles Montoya, male   DOB: [birth year], 52 y.o.   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  He was previously a smoker but did quit several years ago.  He now takes Brilinta.  He was having pain in his right lower quadrant in the past and CT scan demonstrated abnormality of his aorta and common iliac arteries bilaterally.  He subsequently followed up with repeat CT scan and is here today with duplex.  He does not have any new pain remains very active making furniture.  Really has no vascular complaints related to today's visit.  Past Medical History:  Diagnosis Date   Anxiety    CAD (coronary artery disease)    a. cath 12/2016--> s/p DES to RPDA, 50% dRCA, 25% pLAD, 40% mLAD, 25% pro to mid CX   Tobacco abuse    Family History  Problem Relation Age of Onset   CAD Father    Hypertension Mother    Past Surgical History:  Procedure Laterality Date   CARDIAC CATHETERIZATION     CORONARY STENT INTERVENTION N/A 01/19/2017   Procedure: Coronary Stent Intervention;  Surgeon: Corky Crafts, MD;  Location: [facility] INVASIVE CV LAB;  Service: Cardiovascular;  Laterality: N/A;   HERNIA REPAIR     LEFT HEART CATH AND CORONARY ANGIOGRAPHY N/A 01/19/2017   Procedure: Left Heart Cath and Coronary Angiography;  Surgeon: Corky Crafts, MD;  Location: [facility] INVASIVE CV LAB;  Service: Cardiovascular;  Laterality: N/A;    Short Social History:  Social History   Tobacco Use   Smoking status: Former    Current packs/day: 0.00    Types: Cigarettes    Quit date: 01/19/2017    Years since quitting: 6.2   Smokeless tobacco: Never   Tobacco comments:    Using nicoderm gum  Substance Use Topics   Alcohol use: No    Allergies  Allergen Reactions   Codeine Itching   Lactose Intolerance (Gi) Other (See Comments)     Abdominal pain    Other Itching    MSG food allergy    Praluent [Alirocumab] Other (See Comments)    Fatigue, weakness, back pain, depression    Repatha [Evolocumab] Other (See Comments)    Fatigue, weakness, back pain, depression    Statins Other (See Comments)    Atorvastatin, Rosuvastatin, Simvastatin - generalized fatigue, weakness,  Muscle pain    Current Outpatient Medications  Medication Sig Dispense Refill   ALPRAZolam (XANAX) 0.5 MG tablet Take 0.25-0.5 mg by mouth 3 (three) times daily as needed for anxiety or sleep. Anxiety     Bempedoic Acid (NEXLETOL) 180 MG TABS Take 1 tablet by mouth daily. 90 tablet 3   calcium carbonate (TUMS - DOSED IN MG ELEMENTAL CALCIUM) 500 MG chewable tablet Chew 2 tablets by mouth 4 (four) times daily as needed for indigestion or heartburn.     clomiPRAMINE (ANAFRANIL) 25 MG capsule Take 25 mg by mouth at bedtime.     clotrimazole-betamethasone (LOTRISONE) cream Apply topically 2 (two) times daily as needed. Apply topically 2 (two) times daily as needed.     ezetimibe (ZETIA) 10 MG tablet Take 1 tablet (10 mg total) by mouth daily. 30 tablet 11   nicotine polacrilex (NICORETTE) 4 MG gum Take 4 mg by  mouth as needed for smoking cessation.     nitroGLYCERIN (NITROSTAT) 0.4 MG SL tablet 1 TAB UNDER TONGUE AS NEEDED FOR CHEST PAIN. MAY REPEAT EVERY 5 MIN FOR A TOTAL OF 3 DOSES. 25 tablet 11   sildenafil (REVATIO) 20 MG tablet Take 2-5 tablets by mouth. 30 minutes prior to sexual intercourse     ticagrelor (BRILINTA) 60 MG TABS tablet TAKE ONE TABLET BY MOUTH TWICE DAILY 60 tablet 11   tiZANidine (ZANAFLEX) 4 MG tablet Take 4 mg by mouth 3 (three) times daily.     No current facility-administered medications for this visit.    Review of Systems  Constitutional:  Constitutional negative. HENT: HENT negative.  Eyes: Eyes negative.  Respiratory: Respiratory negative.  Cardiovascular: Cardiovascular negative.  GI: Gastrointestinal negative.   Musculoskeletal: Musculoskeletal negative.  Skin: Skin negative.  Neurological: Neurological negative. Hematologic: Hematologic/lymphatic negative.  Psychiatric: Psychiatric negative.        Objective:  Objective   Vitals:   04/06/23 0937  BP: (!) 139/91  Pulse: 66  Resp: 20  Temp: 97.9 F (36.6 C)  SpO2: 95%  Weight: 188 lb (85.3 kg)  Height: 5\' 10"  (1.778 m)   Body mass index is 26.98 kg/m.  Physical Exam HENT:     Head: Normocephalic.     Nose: Nose normal.  Eyes:     Pupils: Pupils are equal, round, and reactive to light.  Cardiovascular:     Rate and Rhythm: Normal rate.     Pulses: Normal pulses.  Pulmonary:     Effort: Pulmonary effort is normal.  Abdominal:     General: Abdomen is flat.     Palpations: Abdomen is soft.  Musculoskeletal:     Cervical back: Normal range of motion.     Right lower leg: No edema.     Left lower leg: No edema.  Skin:    General: Skin is warm.     Capillary Refill: Capillary refill takes less than 2 seconds.  Neurological:     General: No focal deficit present.     Mental Status: He is alert.  Psychiatric:        Mood and Affect: Mood normal.        Thought Content: Thought content normal.        Judgment: Judgment normal.     Data: Abdominal Aorta Findings:  +-----------+-------+----------+----------+--------+--------+--------+  Location  AP (cm)Trans (cm)PSV (cm/s)WaveformThrombusComments  +-----------+-------+----------+----------+--------+--------+--------+  Proximal  1.77   1.97      63                                  +-----------+-------+----------+----------+--------+--------+--------+  Mid       1.99   2.10      62                                  +-----------+-------+----------+----------+--------+--------+--------+  Distal    2.43   2.60      78                                  +-----------+-------+----------+----------+--------+--------+--------+  RT CIA Prox2.1     2.3       280                                 +-----------+-------+----------+----------+--------+--------+--------+  LT CIA Prox1.4    1.4       224                                 +-----------+-------+----------+----------+--------+--------+--------+         Summary:  Abdominal Aorta: There is evidence of abnormal dilation of the Right  Common Iliac artery.  Stenosis: +------------------+-------------+  Location          Stenosis       +------------------+-------------+  Right Common Iliac>50% stenosis  +------------------+-------------+  Left Common Iliac >50% stenosis  +------------------+-------------+       Assessment/Plan:    52 year old male with increased size of his distal aorta and right common iliac artery although duplex appears very stable from previous CT scan.  We will follow-up with duplex yearly where there is also notable 50% stenosis of the iliac arteries as this is asymptomatic likely from previous history of smoking.  Follow-up in 1 year possibly can go up to 2 years if stable at that time.     Maeola Harman MD [facility]

## 2023-04-08 ENCOUNTER — Other Ambulatory Visit: Payer: Self-pay

## 2023-04-08 DIAGNOSIS — I7772 Dissection of iliac artery: Secondary | ICD-10-CM

## 2023-05-02 DIAGNOSIS — F4010 Social phobia, unspecified: Secondary | ICD-10-CM | POA: Diagnosis not present

## 2023-05-02 DIAGNOSIS — F410 Panic disorder [episodic paroxysmal anxiety] without agoraphobia: Secondary | ICD-10-CM | POA: Diagnosis not present

## 2023-06-10 ENCOUNTER — Other Ambulatory Visit (HOSPITAL_COMMUNITY): Payer: Self-pay

## 2023-06-10 NOTE — Telephone Encounter (Signed)
ERROR

## 2023-06-10 NOTE — Telephone Encounter (Signed)
Pharmacy Patient Advocate Encounter   Received notification from CoverMyMeds that prior authorization for NEXLETOL is required/requested.   Insurance verification completed.   The patient is insured through [facility] .   Per test claim: PA required; PA submitted to above mentioned insurance via CoverMyMeds Key/confirmation #/EOC [id number] Status is pending

## 2023-07-17 ENCOUNTER — Other Ambulatory Visit: Payer: Self-pay | Admitting: Cardiovascular Disease

## 2023-09-22 DIAGNOSIS — Z131 Encounter for screening for diabetes mellitus: Secondary | ICD-10-CM | POA: Diagnosis not present

## 2023-09-22 DIAGNOSIS — E7800 Pure hypercholesterolemia, unspecified: Secondary | ICD-10-CM | POA: Diagnosis not present

## 2023-09-22 DIAGNOSIS — Z125 Encounter for screening for malignant neoplasm of prostate: Secondary | ICD-10-CM | POA: Diagnosis not present

## 2023-09-22 DIAGNOSIS — Z0000 Encounter for general adult medical examination without abnormal findings: Secondary | ICD-10-CM | POA: Diagnosis not present

## 2023-09-27 ENCOUNTER — Encounter: Payer: Self-pay | Admitting: *Deleted

## 2023-10-25 DIAGNOSIS — F410 Panic disorder [episodic paroxysmal anxiety] without agoraphobia: Secondary | ICD-10-CM | POA: Diagnosis not present

## 2023-10-25 DIAGNOSIS — F4010 Social phobia, unspecified: Secondary | ICD-10-CM | POA: Diagnosis not present

## 2023-11-29 ENCOUNTER — Other Ambulatory Visit: Payer: Self-pay

## 2023-11-29 DIAGNOSIS — Z122 Encounter for screening for malignant neoplasm of respiratory organs: Secondary | ICD-10-CM

## 2023-11-29 DIAGNOSIS — Z87891 Personal history of nicotine dependence: Secondary | ICD-10-CM

## 2023-12-01 ENCOUNTER — Telehealth: Payer: Self-pay | Admitting: Acute Care

## 2023-12-01 NOTE — Telephone Encounter (Signed)
 Lung Cancer Screening Narrative/Criteria Questionnaire (Cigarette Smokers Only- No Cigars/Pipes/vapes)   Charles Montoya   SDMV:12/16/23 at 10am/ [facility]                                           1970-12-24              LDCT: 12/20/23 at 10:40am / [facility]    53 y.o.   Phone: [phone]  Lung Screening Narrative (confirm age 91-77 yrs Medicare / 50-80 yrs Private pay insurance)   Insurance information:BCBS   Referring Provider:Koirala   This screening involves an initial phone call with a team member from our program. It is called a shared decision making visit. The initial meeting is required by insurance and Medicare to make sure you understand the program. This appointment takes about 15-20 minutes to complete. The CT scan will completed at a separate date/time. This scan takes about 5-10 minutes to complete and you may eat and drink before and after the scan.  Criteria questions for Lung Cancer Screening:   Are you a current or former smoker? Former Age began smoking: 53yo   If you are a former smoker, what year did you quit smoking? 2018   To calculate your smoking history, I need an accurate estimate of how many packs of cigarettes you smoked per day and for how many years. (Not just the number of PPD you are now smoking)   Years smoking 29 x Packs per day 1-2p = Pack years 44   (at least 20 pack yrs)   (Make sure they understand that we need to know how much they have smoked in the past, not just the number of PPD they are smoking now)  Do you have a personal history of cancer?  No    Do you have a family history of cancer? No  Are you coughing up blood?  No  Have you had unexplained weight loss of 15 lbs or more in the last 6 months? No  It looks like you meet all criteria.     Additional information: N/A

## 2023-12-16 ENCOUNTER — Ambulatory Visit: Admitting: Acute Care

## 2023-12-16 ENCOUNTER — Encounter: Payer: Self-pay | Admitting: Acute Care

## 2023-12-16 DIAGNOSIS — Z87891 Personal history of nicotine dependence: Secondary | ICD-10-CM

## 2023-12-16 NOTE — Patient Instructions (Signed)

## 2023-12-16 NOTE — Progress Notes (Signed)
 Virtual Visit via Telephone Note  I connected with Charles Montoya on 12/16/23 at 10:00 AM EDT by telephone and verified that I am speaking with the correct person using two identifiers.  Location: Patient:  At home Provider: [address] [place], [place], [address]    I discussed the limitations, risks, security and privacy concerns of performing an evaluation and management service by telephone and the availability of in person appointments. I also discussed with the patient that there may be a patient responsible charge related to this service. The patient expressed understanding and agreed to proceed.   Shared Decision Making Visit Lung Cancer Screening Program [phone])   Eligibility: Age 53 y.o. Pack Years Smoking History Calculation 44 pack year smoking history (# packs/per year x # years smoked) Recent History of coughing up blood  no Unexplained weight loss? no ( >Than 15 pounds within the last 6 months ) Prior History Lung / other cancer no (Diagnosis within the last 5 years already requiring surveillance chest CT Scans). Smoking Status Former Smoker Former Smokers: Years since quit: 6.5 years  Quit Date: 01/19/2017  Visit Components: Discussion included one or more decision making aids. yes Discussion included risk/benefits of screening. yes Discussion included potential follow up diagnostic testing for abnormal scans. yes Discussion included meaning and risk of over diagnosis. yes Discussion included meaning and risk of False Positives. yes Discussion included meaning of total radiation exposure. yes  Counseling Included: Importance of adherence to annual lung cancer LDCT screening. yes Impact of comorbidities on ability to participate in the program. yes Ability and willingness to under diagnostic treatment. yes  Smoking Cessation Counseling: Current Smokers:  Discussed importance of smoking cessation. yes Information about tobacco cessation classes and  interventions provided to patient. yes Patient provided with "ticket" for LDCT Scan. yes Symptomatic Patient. no  Counseling NA Diagnosis Code: Tobacco Use Z72.0 Asymptomatic Patient yes  Counseling (Intermediate counseling: > three minutes counseling) W0981 Former Smokers:  Discussed the importance of maintaining cigarette abstinence. yes Diagnosis Code: Personal History of Nicotine  Dependence. X91.478 Information about tobacco cessation classes and interventions provided to patient. Yes Patient provided with "ticket" for LDCT Scan. yes Written Order for Lung Cancer Screening with LDCT placed in Epic. Yes (CT Chest Lung Cancer Screening Low Dose W/O CM) [id number] Z12.2-Screening of respiratory organs Z87.891-Personal history of nicotine  dependence  Former smoker, quit 2018   Raejean Bullock, NP 12/16/2023

## 2023-12-20 ENCOUNTER — Ambulatory Visit
Admission: RE | Admit: 2023-12-20 | Discharge: 2023-12-20 | Disposition: A | Discharge status: Home / Self Care | Source: Ambulatory Visit | Attending: Family Medicine | Admitting: Family Medicine

## 2023-12-20 DIAGNOSIS — Z122 Encounter for screening for malignant neoplasm of respiratory organs: Secondary | ICD-10-CM | POA: Diagnosis not present

## 2023-12-20 DIAGNOSIS — Z87891 Personal history of nicotine dependence: Secondary | ICD-10-CM | POA: Diagnosis not present

## 2024-01-09 ENCOUNTER — Other Ambulatory Visit: Payer: Self-pay

## 2024-01-09 DIAGNOSIS — Z87891 Personal history of nicotine dependence: Secondary | ICD-10-CM

## 2024-01-09 DIAGNOSIS — Z122 Encounter for screening for malignant neoplasm of respiratory organs: Secondary | ICD-10-CM

## 2024-01-19 ENCOUNTER — Other Ambulatory Visit: Payer: Self-pay | Admitting: Cardiovascular Disease

## 2024-01-19 NOTE — Progress Notes (Unsigned)
 Cardiology Office Note:    Date:  01/23/2024   ID:  Charles Montoya, DOB [birth year], MRN [id number]  PCP:  Regino Slater, MD  Cardiologist:  Aleene Passe, MD    Referring MD: Regino Slater, MD   Problem List 1. Montoya - s/p DES to PDA 2. Hyperlipidemia:   Chief Complaint  Patient presents with   Coronary Artery Montoya        Hyperlipidemia       Charles Montoya is a 53 y.o. male with a hx of Coronary artery Montoya. He status post stenting of his posterior descending artery in June, 2018. He has moderate Montoya and his left anterior descending artery and circumflex proximal artery.  Is exercising 4 times a week. Has stopped smoking  Using nicorette gum .   He was having lots of fatigue - he reduced his atorvastatin  to 20 mg QOD.   Fatigue  Is better.     November 08, 2017 Doing well.  Lipid levels look good  Has vague aches and pains.   No angina pain  Exercising regularly  intolerent to statins ,  Is on Repatha  Asked questions about insulin resistance  Oct. 2, 2019:  Charles Montoya.   S/p stenting of his PDA. Was on Repatha  - became very fatigued. Stopped the Repatha  l several weeks ago.   Is finally feeling better now Tried multiple statins,  All the ones he tried made him ache.  , foggy headed. , fatigued.    April 27, 2019: CV seen today for follow-up of his coronary artery Montoya.  He status post testing of his right coronary artery. Has a history of hyperlipidemia.  He tried multiple statins all of which make him have muscle aches and feel foggy headed.  He tried Repatha  but became very fatigued. Pralulent also caused severe fagigue. VAsepa caused leg weakness.  Is on Brilinta  60 mg bid.  Off ASA  Dec 18, 2019:  Charles Montoya is seen back for his Montoya, Charles Montoya  Intolerant to multiple statins, repatha  and pralulent BP is typically well controlled.  Has some anxiety issues on occasion Is not exercising as much.   Wants to get back into  cardiac rehab. Has tried Zetia  but developed severe aches and fatigue ( cant get out of bed)  We discussed the book  [employer]elstyn - Prevent and Reverse Heart Montoya  We had a long discussion about Nexletol . We discussed QOD dosing of Atorvastatin    He wants to try atorvastatin  20 QOD , Quinol.     October 06, 2020: Charles Montoya.  He has had some residual shoulder pain that feels very similar to his angina.  He has been to the emergency room a couple times. He had a stress test.  It shows a previous inferior wall myocardial infarction which is known.  There is no evidence of ischemia.  Not as much exercise recently  Has seen Dr. Mona.  Has tried Repatha  and then felt poorly  He is hesitant to try Inclisiran   He needs to have a colonoscopy  . Needs to hold Brilinta  for 5-7 days   Nov 23, 2021  Doing well On Brilinta  60 BID  No CP , no dyspnea.  Has seen Hilty for his Charles Montoya.  Is on Zetia  Has tried repatha , pralulent Wants to add bempidoic acid   January 20, 2024 Charles Montoya is seen for follow up  of his Montoya , Charles Montoya  Is intolerant to multiple lipid lowering medications  He is agreeable to try Nexletol  180 mg a day   I have discussed with Melissa Maccia, PharmD who thinks Inclisiran would be a good choice He is hesitant to try      Past Medical History:  Diagnosis Date   Anxiety    Montoya (coronary artery Montoya)    a. cath 12/2016--> s/p DES to RPDA, 50% dRCA, 25% pLAD, 40% mLAD, 25% pro to mid CX   Tobacco abuse     Past Surgical History:  Procedure Laterality Date   CARDIAC CATHETERIZATION     CORONARY STENT INTERVENTION N/A 01/19/2017   Procedure: Coronary Stent Intervention;  Surgeon: Dann Candyce RAMAN, MD;  Location: [facility] INVASIVE CV LAB;  Service: Cardiovascular;  Laterality: N/A;   HERNIA REPAIR     LEFT HEART CATH AND CORONARY ANGIOGRAPHY N/A 01/19/2017   Procedure: Left Heart Cath and Coronary Angiography;   Surgeon: Dann Candyce RAMAN, MD;  Location: [facility] INVASIVE CV LAB;  Service: Cardiovascular;  Laterality: N/A;    Current Medications: Current Meds  Medication Sig   ALPRAZolam  (XANAX ) 0.5 MG tablet Take 0.25-0.5 mg by mouth 3 (three) times daily as needed for anxiety or sleep. Anxiety   calcium  carbonate (TUMS - DOSED IN MG ELEMENTAL CALCIUM ) 500 MG chewable tablet Chew 2 tablets by mouth 4 (four) times daily as needed for indigestion or heartburn.   clomiPRAMINE  (ANAFRANIL ) 25 MG capsule Take 25 mg by mouth at bedtime.   clotrimazole-betamethasone (LOTRISONE) cream Apply topically 2 (two) times daily as needed. Apply topically 2 (two) times daily as needed.   ezetimibe  (ZETIA ) 10 MG tablet Take 1 tablet (10 mg total) by mouth daily.   nicotine  polacrilex (NICORETTE) 4 MG gum Take 4 mg by mouth as needed for smoking cessation.   nitroGLYCERIN  (NITROSTAT ) 0.4 MG SL tablet 1 TAB UNDER TONGUE AS NEEDED FOR CHEST PAIN. MAY REPEAT EVERY 5 MIN FOR A TOTAL OF 3 DOSES.   sildenafil (REVATIO) 20 MG tablet Take 2-5 tablets by mouth. 30 minutes prior to sexual intercourse   ticagrelor  (BRILINTA ) 60 MG TABS tablet TAKE ONE TABLET BY MOUTH TWICE DAILY   [DISCONTINUED] Bempedoic Acid  (NEXLETOL ) 180 MG TABS Take 1 tablet by mouth daily.     Allergies:   Codeine, Lactose intolerance (gi), Other, Praluent  [alirocumab ], Repatha  [evolocumab ], and Statins   Social History   Socioeconomic History   Marital status: Single    Spouse name: Not on file   Number of children: Not on file   Years of education: Not on file   Highest education level: Not on file  Occupational History   Not on file  Tobacco Use   Smoking status: Former    Current packs/day: 0.00    Types: Cigarettes    Quit date: 01/19/2017    Years since quitting: 7.0   Smokeless tobacco: Never   Tobacco comments:    Using nicoderm gum  Vaping Use   Vaping status: Not on file  Substance and Sexual Activity   Alcohol use: No   Drug use:  No   Sexual activity: Not on file  Other Topics Concern   Not on file  Social History Narrative   Not on file   Social Drivers of Health   Financial Resource Strain: Not on file  Food Insecurity: Not on file  Transportation Needs: Not on file  Physical Activity: Not on file  Stress: Not on file  Social  Connections: Unknown (11/12/2022)   Received from [facility]   Social Network    Social Network: Not on file     Family History: The patient's family history includes Montoya in his father; Hypertension in his mother. ROS:   Please see the history of present illness.     All other systems reviewed and are negative.  EKGs/Labs/Other Studies Reviewed:      Physical Exam: Blood pressure 124/82, pulse 80, height 5' 10 (1.778 m), weight 187 lb 3.2 oz (84.9 kg), SpO2 96%.     GEN:  Well nourished, well developed in no acute distress HEENT: Normal NECK: No JVD; No carotid bruits LYMPHATICS: No lymphadenopathy CARDIAC: RRR , no murmurs, rubs, gallops RESPIRATORY:  Clear to auscultation without rales, wheezing or rhonchi  ABDOMEN: Soft, non-tender, non-distended MUSCULOSKELETAL:  No edema; No deformity  SKIN: Warm and dry NEUROLOGIC:  Alert and oriented x 3    EKG:      EKG Interpretation Date/Time:  Friday January 20 2024 13:23:22 EDT Ventricular Rate:  78 PR Interval:  132 QRS Duration:  100 QT Interval:  374 QTC Calculation: 426 R Axis:   61  Text Interpretation: Normal sinus rhythm Normal ECG When compared with ECG of 22-Mar-2020 17:11, No significant change was found Confirmed by Alveta Mungo ([id number]) on 01/20/2024 1:41:37 PM      Recent Labs: No results found for requested labs within last 365 days.  Recent Lipid Panel    Component Value Date/Time   CHOL 282 (H) 06/09/2020 1104   TRIG 182 (H) 06/09/2020 1104   TRIG 163 (H) 06/07/2017 0918   HDL 36 (L) 06/09/2020 1104   HDL 34 (L) 06/07/2017 0918   CHOLHDL 7.8 (H) 06/09/2020 1104   CHOLHDL 6.6  01/19/2017 0845   VLDL 48 (H) 01/19/2017 0845   LDLCALC 211 (H) 06/09/2020 1104      ASSESSMENT:    1. Coronary artery Montoya involving native coronary artery of native heart without angina pectoris   2. Mixed hyperlipidemia      PLAN:      Montoya -    status post stenting of his posterior descending artery using a 2.5 x 16 mm Promus drug-eluting stent.     2. Hyperlipidema: Lipids have been very challenging to control.  He is on Zetia  but his LDL is still over 130.  We will add Bempedoic acid  to see if that helps.  He is intolerant to Repatha , Praluent ,   statins.   We discussed starting inclisiran.  He is hesitant to try.  I discussed his issue with depression and muscle aches with Eleanor Crews, RPH who agrees that INclisiran would be a good choice   He doesn't want to try this yet but Is willing to try bempedoic acid .  Will start Nexletol  180 mg a day.  Will check lipids, ALT, basic metabolic profile in 3 months.      Medication Adjustments/Labs and Tests Ordered: Current medicines are reviewed at length with the patient today.  Concerns regarding medicines are outlined above.  Orders Placed This Encounter  Procedures   Lipid panel   ALT   Basic metabolic panel with GFR   EKG 87-Ozji   Meds ordered this encounter  Medications   Bempedoic Acid  (NEXLETOL ) 180 MG TABS    Sig: Take 1 tablet (180 mg total) by mouth daily.    Dispense:  90 tablet    Refill:  3    Signed, Mungo Alveta, MD  01/23/2024 4:06 PM  [facility]

## 2024-01-20 ENCOUNTER — Encounter: Payer: Self-pay | Admitting: Cardiovascular Disease

## 2024-01-20 ENCOUNTER — Ambulatory Visit: Attending: Cardiovascular Disease | Admitting: Cardiovascular Disease

## 2024-01-20 VITALS — BP 124/82 | HR 80 | Ht 70.0 in | Wt 187.2 lb

## 2024-01-20 DIAGNOSIS — E782 Mixed hyperlipidemia: Secondary | ICD-10-CM | POA: Diagnosis not present

## 2024-01-20 DIAGNOSIS — I2510 Atherosclerotic heart disease of native coronary artery without angina pectoris: Secondary | ICD-10-CM | POA: Diagnosis not present

## 2024-01-20 MED ORDER — NEXLETOL 180 MG PO TABS: 1.0000 | ORAL_TABLET | Freq: Every day | ORAL | 3 refills | Status: AC

## 2024-01-20 NOTE — Patient Instructions (Signed)
 Medication Instructions:  START Nexletol  180 mg daily *If you need a refill on your cardiac medications before your next appointment, please call your pharmacy*  Lab Work: Lipid panel, ALT, BMP in 3 months  If you have labs (blood work) drawn today and your tests are completely normal, you will receive your results only by: MyChart Message (if you have MyChart) OR A paper copy in the mail If you have any lab test that is abnormal or we need to change your treatment, we will call you to review the results.  Testing/Procedures: NONE  Follow-Up: At [facility], you and your health needs are our priority.  As part of our continuing mission to provide you with exceptional heart care, our providers are all part of one team.  This team includes your primary Cardiologist (physician) and Advanced Practice Providers or APPs (Physician Assistants and Nurse Practitioners) who all work together to provide you with the care you need, when you need it.  Your next appointment:   1 year  Provider:   Medford Cash, MD

## 2024-01-23 ENCOUNTER — Telehealth: Payer: Self-pay | Admitting: Pharmacy Technician

## 2024-01-23 ENCOUNTER — Other Ambulatory Visit (HOSPITAL_COMMUNITY): Payer: Self-pay

## 2024-01-23 NOTE — Telephone Encounter (Signed)
 Pharmacy Patient Advocate Encounter  Received notification from [facility] that Prior Authorization for nexletol  has been APPROVED from ? to ?SABRA Spoke to pharmacy to process.Copay is $unclear on copay they have to see if they can get the medication from the wholesaler [facility] said.

## 2024-01-23 NOTE — Telephone Encounter (Signed)
 Pharmacy Patient Advocate Encounter   Received notification from Onbase that prior authorization for nexletol  is required/requested.   Insurance verification completed.   The patient is insured through [facility] .   Per test claim: PA required; PA submitted to above mentioned insurance via CoverMyMeds Key/confirmation #/EOC [id number] Status is pending

## 2024-02-20 ENCOUNTER — Other Ambulatory Visit: Payer: Self-pay | Admitting: Nurse Practitioner

## 2024-02-27 DIAGNOSIS — R1030 Lower abdominal pain, unspecified: Secondary | ICD-10-CM | POA: Diagnosis not present

## 2024-02-27 DIAGNOSIS — I723 Aneurysm of iliac artery: Secondary | ICD-10-CM | POA: Diagnosis not present

## 2024-02-29 DIAGNOSIS — M9902 Segmental and somatic dysfunction of thoracic region: Secondary | ICD-10-CM | POA: Diagnosis not present

## 2024-02-29 DIAGNOSIS — M9903 Segmental and somatic dysfunction of lumbar region: Secondary | ICD-10-CM | POA: Diagnosis not present

## 2024-02-29 DIAGNOSIS — M9904 Segmental and somatic dysfunction of sacral region: Secondary | ICD-10-CM | POA: Diagnosis not present

## 2024-02-29 DIAGNOSIS — M9905 Segmental and somatic dysfunction of pelvic region: Secondary | ICD-10-CM | POA: Diagnosis not present

## 2024-03-01 ENCOUNTER — Other Ambulatory Visit: Payer: Self-pay | Admitting: Vascular Surgery

## 2024-03-01 DIAGNOSIS — I7772 Dissection of iliac artery: Secondary | ICD-10-CM

## 2024-03-02 DIAGNOSIS — I723 Aneurysm of iliac artery: Secondary | ICD-10-CM | POA: Diagnosis not present

## 2024-03-02 DIAGNOSIS — R1030 Lower abdominal pain, unspecified: Secondary | ICD-10-CM | POA: Diagnosis not present

## 2024-03-22 ENCOUNTER — Ambulatory Visit (HOSPITAL_COMMUNITY)
Admission: RE | Admit: 2024-03-22 | Discharge: 2024-03-22 | Disposition: A | Discharge status: Home / Self Care | Source: Ambulatory Visit | Attending: Vascular Surgery | Admitting: Vascular Surgery

## 2024-03-22 DIAGNOSIS — I7772 Dissection of iliac artery: Secondary | ICD-10-CM | POA: Diagnosis not present

## 2024-04-11 ENCOUNTER — Encounter: Payer: Self-pay | Admitting: Vascular Surgery

## 2024-04-11 ENCOUNTER — Telehealth: Payer: Self-pay | Admitting: Cardiovascular Disease

## 2024-04-11 ENCOUNTER — Other Ambulatory Visit (HOSPITAL_COMMUNITY): Payer: Self-pay

## 2024-04-11 ENCOUNTER — Telehealth: Payer: Self-pay | Admitting: Pharmacy Technician

## 2024-04-11 ENCOUNTER — Ambulatory Visit: Attending: Vascular Surgery | Admitting: Vascular Surgery

## 2024-04-11 VITALS — BP 147/97 | HR 68 | Temp 98.0°F | Ht 70.0 in | Wt 185.0 lb

## 2024-04-11 DIAGNOSIS — I7789 Other specified disorders of arteries and arterioles: Secondary | ICD-10-CM

## 2024-04-11 NOTE — Telephone Encounter (Signed)
 Please update PA for Brilinta  if that is what is needed

## 2024-04-11 NOTE — Telephone Encounter (Signed)
 New message  Patient walked in requesting to speak to someone about his brilinta  medication.  He has papers from [employer] stating that they need something from us  to continue paying for his medication.  He does not have forms for us  to complete.  He want to speak to someone so that his medication will continue to be covered by insurance

## 2024-04-11 NOTE — Telephone Encounter (Signed)
   Too soon until 04/15/24 to see if it is needed

## 2024-04-11 NOTE — Progress Notes (Signed)
 Patient ID: Charles Montoya, male   DOB: [birth year], 53 y.o.   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  Previously a smoker but quit when diagnosed with coronary artery disease.  Currently taking Brilinta .  Had history of right lower quadrant pain underwent CT scan which demonstrated enlarged aorta and bilateral common iliac arteries.  He is now here for 1 year follow-up with noninvasive studies.  He has no new back or abdominal pain.  He remains very active.  Past Medical History:  Diagnosis Date   Anxiety    CAD (coronary artery disease)    a. cath 12/2016--> s/p DES to RPDA, 50% dRCA, 25% pLAD, 40% mLAD, 25% pro to mid CX   Tobacco abuse    Family History  Problem Relation Age of Onset   CAD Father    Hypertension Mother    Past Surgical History:  Procedure Laterality Date   CARDIAC CATHETERIZATION     CORONARY STENT INTERVENTION N/A 01/19/2017   Procedure: Coronary Stent Intervention;  Surgeon: Dann Candyce RAMAN, MD;  Location: [facility] INVASIVE CV LAB;  Service: Cardiovascular;  Laterality: N/A;   HERNIA REPAIR     LEFT HEART CATH AND CORONARY ANGIOGRAPHY N/A 01/19/2017   Procedure: Left Heart Cath and Coronary Angiography;  Surgeon: Dann Candyce RAMAN, MD;  Location: [facility] INVASIVE CV LAB;  Service: Cardiovascular;  Laterality: N/A;    Short Social History:  Social History   Tobacco Use   Smoking status: Former    Current packs/day: 0.00    Types: Cigarettes    Quit date: 01/19/2017    Years since quitting: 7.2   Smokeless tobacco: Never   Tobacco comments:    Using nicoderm gum  Substance Use Topics   Alcohol use: No    Allergies  Allergen Reactions   Codeine Itching   Lactose Intolerance (Gi) Other (See Comments)    Abdominal pain    Other Itching    MSG food allergy     Praluent  [Alirocumab ] Other (See Comments)    Fatigue, weakness, back pain, depression    Repatha  [Evolocumab ] Other (See Comments)    Fatigue, weakness, back pain, depression    Statins Other (See Comments)    Atorvastatin , Rosuvastatin , Simvastatin - generalized fatigue, weakness,  Muscle pain    Current Outpatient Medications  Medication Sig Dispense Refill   ALPRAZolam  (XANAX ) 0.5 MG tablet Take 0.25-0.5 mg by mouth 3 (three) times daily as needed for anxiety or sleep. Anxiety     Bempedoic Acid  (NEXLETOL ) 180 MG TABS Take 1 tablet (180 mg total) by mouth daily. 90 tablet 3   calcium  carbonate (TUMS - DOSED IN MG ELEMENTAL CALCIUM ) 500 MG chewable tablet Chew 2 tablets by mouth 4 (four) times daily as needed for indigestion or heartburn.     clomiPRAMINE  (ANAFRANIL ) 25 MG capsule Take 25 mg by mouth at bedtime.     clotrimazole-betamethasone (LOTRISONE) cream Apply topically 2 (two) times daily as needed. Apply topically 2 (two) times daily as needed.     ezetimibe  (ZETIA ) 10 MG tablet TAKE ONE TABLET BY MOUTH DAILY 30 tablet 11   nicotine  polacrilex (NICORETTE) 4 MG gum Take 4 mg by mouth as needed for smoking cessation.     nitroGLYCERIN  (NITROSTAT ) 0.4 MG SL  tablet 1 TAB UNDER TONGUE AS NEEDED FOR CHEST PAIN. MAY REPEAT EVERY 5 MIN FOR A TOTAL OF 3 DOSES. 25 tablet 11   sildenafil (REVATIO) 20 MG tablet Take 2-5 tablets by mouth. 30 minutes prior to sexual intercourse     ticagrelor  (BRILINTA ) 60 MG TABS tablet TAKE ONE TABLET BY MOUTH TWICE DAILY 180 tablet 3   No current facility-administered medications for this visit.    Review of Systems  Constitutional:  Constitutional negative. HENT: HENT negative.  Eyes: Eyes negative.  Respiratory: Respiratory negative.  Cardiovascular: Cardiovascular negative.  GI: Gastrointestinal negative.  Musculoskeletal: Musculoskeletal negative.  Skin: Skin negative.  Neurological: Neurological negative. Hematologic: Hematologic/lymphatic  negative.  Psychiatric: Psychiatric negative.        Objective:  Objective   Vitals:   04/11/24 1101  BP: (!) 147/97  Pulse: 68  Temp: 98 F (36.7 C)  SpO2: 98%  Weight: 185 lb (83.9 kg)  Height: 5' 10 (1.778 m)   Body mass index is 26.54 kg/m.  Physical Exam HENT:     Head: Normocephalic.     Nose: Nose normal.  Eyes:     Pupils: Pupils are equal, round, and reactive to light.  Cardiovascular:     Rate and Rhythm: Normal rate.     Pulses: Normal pulses.  Pulmonary:     Effort: Pulmonary effort is normal.  Abdominal:     General: Abdomen is flat.     Palpations: Abdomen is soft.  Musculoskeletal:        General: Normal range of motion.     Cervical back: Normal range of motion.     Right lower leg: No edema.     Left lower leg: No edema.  Skin:    General: Skin is warm.     Capillary Refill: Capillary refill takes less than 2 seconds.  Neurological:     General: No focal deficit present.     Mental Status: He is alert.  Psychiatric:        Mood and Affect: Mood normal.        Thought Content: Thought content normal.        Judgment: Judgment normal.     Data: Abdominal Aorta Findings:  +-----------+-------+----------+----------+---------+--------+--------+  Location  AP (cm)Trans (cm)PSV (cm/s)Waveform ThrombusComments  +-----------+-------+----------+----------+---------+--------+--------+  Proximal  1.76   1.92      76                                   +-----------+-------+----------+----------+---------+--------+--------+  Mid       1.98   2.12      73                                   +-----------+-------+----------+----------+---------+--------+--------+  Distal    2.41   2.62      75                                   +-----------+-------+----------+----------+---------+--------+--------+  RT CIA Prox2.1    2.3       280       triphasic                   +-----------+-------+----------+----------+---------+--------+--------+  LT CIA Prox1.4    1.4       228  triphasic                  +-----------+-------+----------+----------+---------+--------+--------+         Summary:  Stenosis: +------------------+-------------+  Location          Stenosis       +------------------+-------------+  Right Common Iliac>50% stenosis  +------------------+-------------+  Left Common Iliac >50% stenosis  +------------------+-------------+   There is evidence of abnormal dilation of the Right Common Iliac artery  measuring 2.3cm. Finding essentially unchanged since prior exam done  04/06/23 measuring 2.3cm.         Assessment/Plan:     53 year old male with stable enlarged distal aorta and right common iliac artery from 1 year ago now over 18 months out from initial evaluation.  Plan will be for 2-year follow-up with aortoiliac duplex.     Penne Lonni Colorado MD [facility]

## 2024-04-16 ENCOUNTER — Other Ambulatory Visit (HOSPITAL_COMMUNITY): Payer: Self-pay

## 2024-04-16 NOTE — Telephone Encounter (Signed)
 I called [facility] and this went through  I called pt to make him aware

## 2024-04-16 NOTE — Telephone Encounter (Signed)
 Test claim went through for brilinta  for 60.00 for 90 days    I called [facility] and asked them to run brilinta . Brilinta  went through just fin  I called pt to make him aware

## 2024-04-24 DIAGNOSIS — F4010 Social phobia, unspecified: Secondary | ICD-10-CM | POA: Diagnosis not present

## 2024-04-24 DIAGNOSIS — F410 Panic disorder [episodic paroxysmal anxiety] without agoraphobia: Secondary | ICD-10-CM | POA: Diagnosis not present

## 2024-05-24 ENCOUNTER — Other Ambulatory Visit (HOSPITAL_COMMUNITY): Payer: Self-pay

## 2025-04-03 ENCOUNTER — Ambulatory Visit: Admitting: Physician Assistant
